# Patient Record
Sex: Male | Born: 1985 | Race: Black or African American | Hispanic: No | Marital: Single
Health system: Southern US, Community
[De-identification: ages and names within clinical notes are randomized; demographics above are authoritative.]

## PROBLEM LIST (undated history)

## (undated) HISTORY — PX: APPENDECTOMY: SHX54

---

## 2018-04-11 ENCOUNTER — Encounter (HOSPITAL_COMMUNITY): Payer: Self-pay | Admitting: Emergency Medicine

## 2018-04-11 ENCOUNTER — Other Ambulatory Visit: Payer: Self-pay

## 2018-04-11 ENCOUNTER — Emergency Department (HOSPITAL_COMMUNITY)
Admission: EM | Admit: 2018-04-11 | Discharge: 2018-04-12 | Disposition: A | Discharge status: Home / Self Care | Payer: Self-pay | Attending: Emergency Medicine | Admitting: Emergency Medicine

## 2018-04-11 DIAGNOSIS — R1013 Epigastric pain: Secondary | ICD-10-CM | POA: Insufficient documentation

## 2018-04-11 DIAGNOSIS — R112 Nausea with vomiting, unspecified: Secondary | ICD-10-CM | POA: Insufficient documentation

## 2018-04-11 DIAGNOSIS — R1010 Upper abdominal pain, unspecified: Secondary | ICD-10-CM | POA: Insufficient documentation

## 2018-04-11 LAB — URINALYSIS, ROUTINE W REFLEX MICROSCOPIC
Bacteria, UA: NONE SEEN
Bilirubin Urine: NEGATIVE
Glucose, UA: NEGATIVE mg/dL
Ketones, ur: 80 mg/dL — AB
Leukocytes,Ua: NEGATIVE
Nitrite: NEGATIVE
PH: 6 (ref 5.0–8.0)
Protein, ur: 30 mg/dL — AB
Specific Gravity, Urine: 1.031 — ABNORMAL HIGH (ref 1.005–1.030)

## 2018-04-11 LAB — COMPREHENSIVE METABOLIC PANEL
ALT: 31 U/L (ref 0–44)
AST: 34 U/L (ref 15–41)
Albumin: 4.7 g/dL (ref 3.5–5.0)
Alkaline Phosphatase: 59 U/L (ref 38–126)
Anion gap: 14 (ref 5–15)
BUN: 11 mg/dL (ref 6–20)
CALCIUM: 9.5 mg/dL (ref 8.9–10.3)
CO2: 23 mmol/L (ref 22–32)
Chloride: 95 mmol/L — ABNORMAL LOW (ref 98–111)
Creatinine, Ser: 0.92 mg/dL (ref 0.61–1.24)
GFR calc Af Amer: >60 mL/min (ref >60)
GFR calc non Af Amer: >60 mL/min (ref >60)
Glucose, Bld: 97 mg/dL (ref 70–99)
Potassium: 3.7 mmol/L (ref 3.5–5.1)
Sodium: 132 mmol/L — ABNORMAL LOW (ref 135–145)
Total Bilirubin: 1 mg/dL (ref 0.3–1.2)
Total Protein: 7.5 g/dL (ref 6.5–8.1)

## 2018-04-11 LAB — CBC
HCT: 42.9 % (ref 39.0–52.0)
Hemoglobin: 13.9 g/dL (ref 13.0–17.0)
MCH: 29.3 pg (ref 26.0–34.0)
MCHC: 32.4 g/dL (ref 30.0–36.0)
MCV: 90.3 fL (ref 80.0–100.0)
Platelets: 256 10*3/uL (ref 150–400)
RBC: 4.75 MIL/uL (ref 4.22–5.81)
RDW: 13 % (ref 11.5–15.5)
WBC: 5.4 10*3/uL (ref 4.0–10.5)
nRBC: 0 % (ref 0.0–0.2)

## 2018-04-11 LAB — LIPASE, BLOOD: Lipase: 26 U/L (ref 11–51)

## 2018-04-11 MED ORDER — SODIUM CHLORIDE 0.9% FLUSH: 3.0000 mL | Freq: Once | INTRAVENOUS | Status: DC

## 2018-04-11 MED ORDER — FAMOTIDINE 20 MG PO TABS
20.0000 mg | ORAL_TABLET | Freq: Once | ORAL | Status: AC
  Administered 2018-04-12: 20 mg via ORAL
  Filled 2018-04-11: qty 1

## 2018-04-11 MED ORDER — SODIUM CHLORIDE 0.9 % IV BOLUS: 500.0000 mL | Freq: Once | INTRAVENOUS | Status: DC

## 2018-04-11 MED ORDER — ONDANSETRON 4 MG PO TBDP
4.0000 mg | ORAL_TABLET | Freq: Once | ORAL | Status: AC
  Administered 2018-04-12: 4 mg via ORAL
  Filled 2018-04-11: qty 1

## 2018-04-11 MED ORDER — ONDANSETRON HCL 4 MG/2ML IJ SOLN: 4.0000 mg | Freq: Once | INTRAMUSCULAR | Status: DC

## 2018-04-11 MED ORDER — FAMOTIDINE IN NACL 20-0.9 MG/50ML-% IV SOLN: 20.0000 mg | Freq: Once | INTRAVENOUS | Status: DC

## 2018-04-11 NOTE — ED Triage Notes (Signed)
C/ generalized abd pain, nausea, and vomiting since Wednesday.

## 2018-04-12 ENCOUNTER — Emergency Department (HOSPITAL_COMMUNITY): Payer: Self-pay

## 2018-04-12 IMAGING — US US ABDOMEN LIMITED
1 series · 14 of 25 positions shown · non-contrast
Comparison: None.

CLINICAL DATA: Upper abdominal pain.  Nausea and vomiting.

EXAM:
ULTRASOUND ABDOMEN LIMITED RIGHT UPPER QUADRANT

[Series 1: us abdomen limited · 14 of 56 slices shown]
[im 1/56]
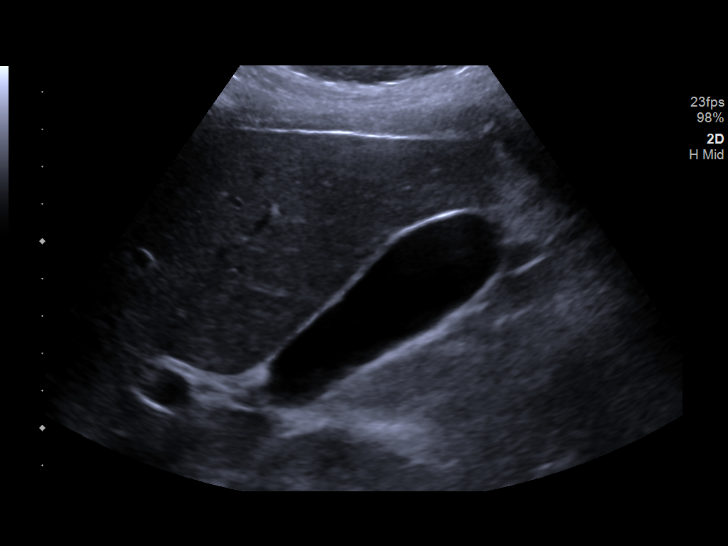
[im 5/56]
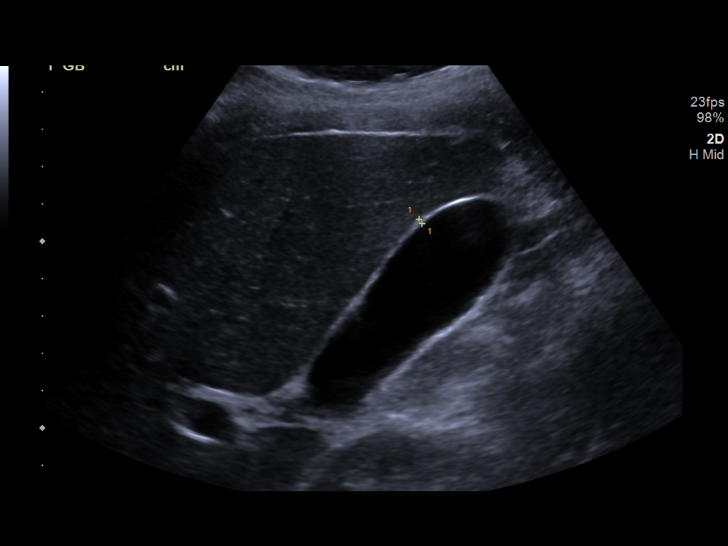
[im 10/56]
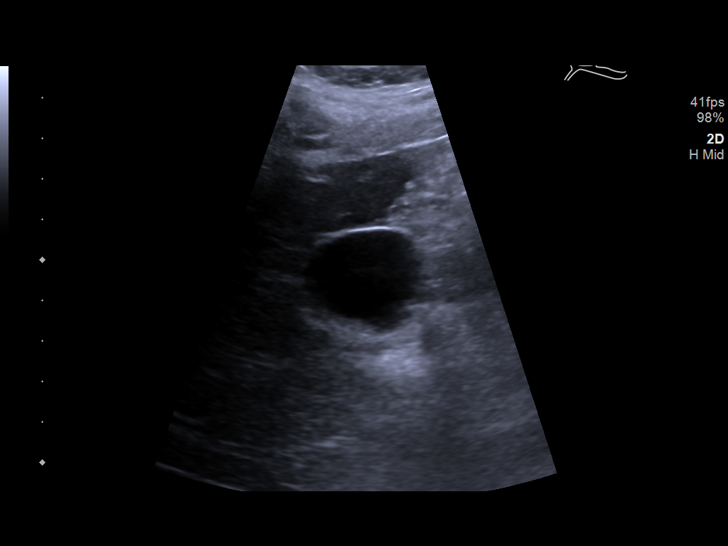
[im 14/56]
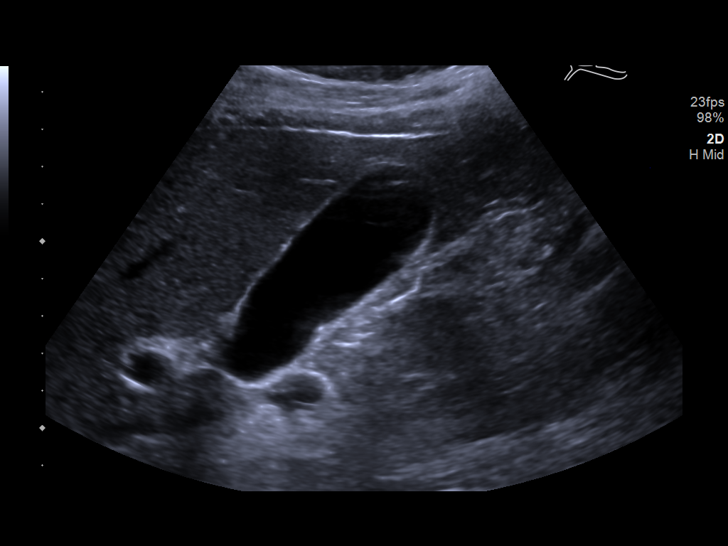
[im 19/56]
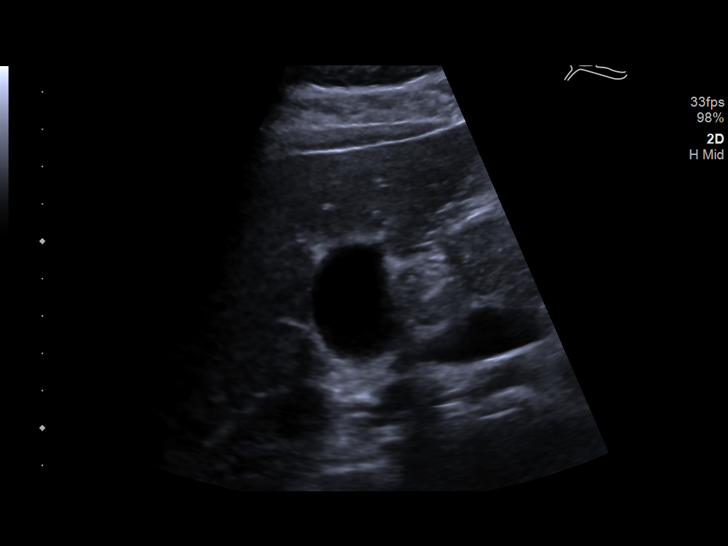
[im 21/56]
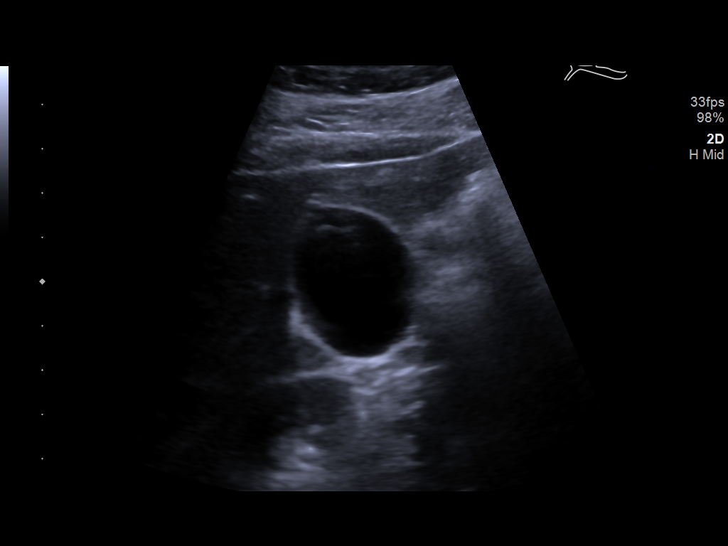
[im 26/56]
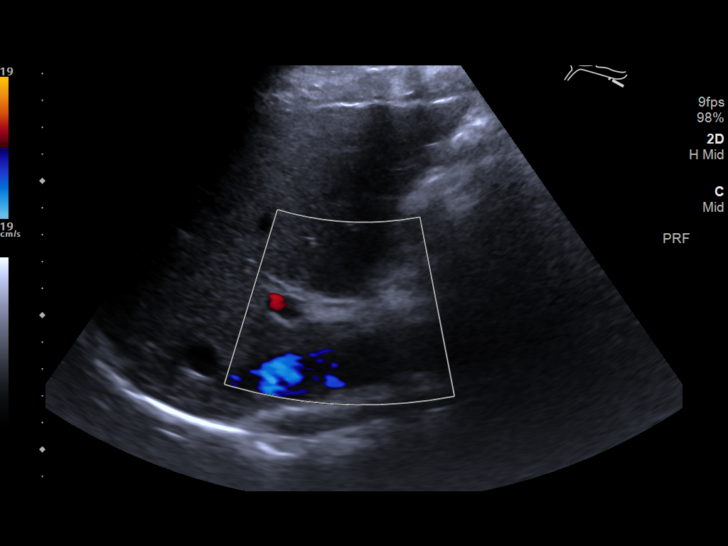
[im 30/56]
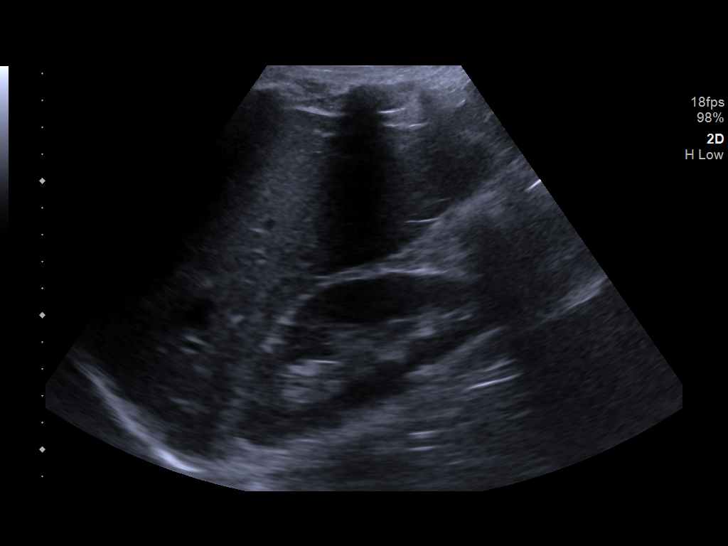
[im 35/56]
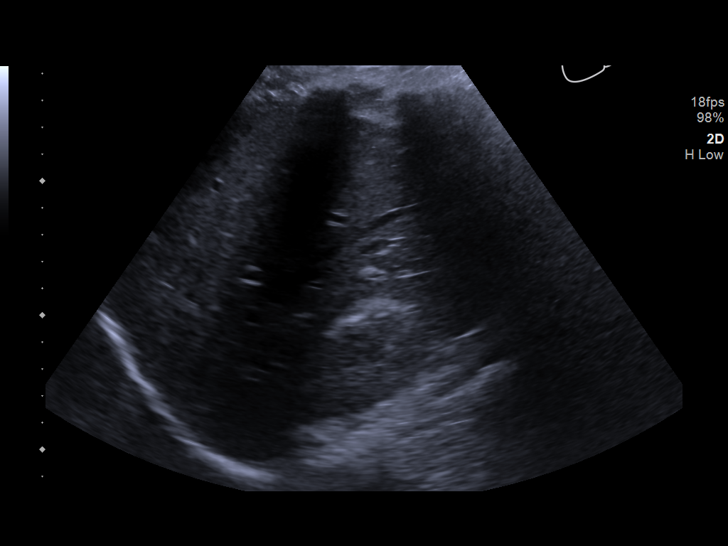
[im 37/56]
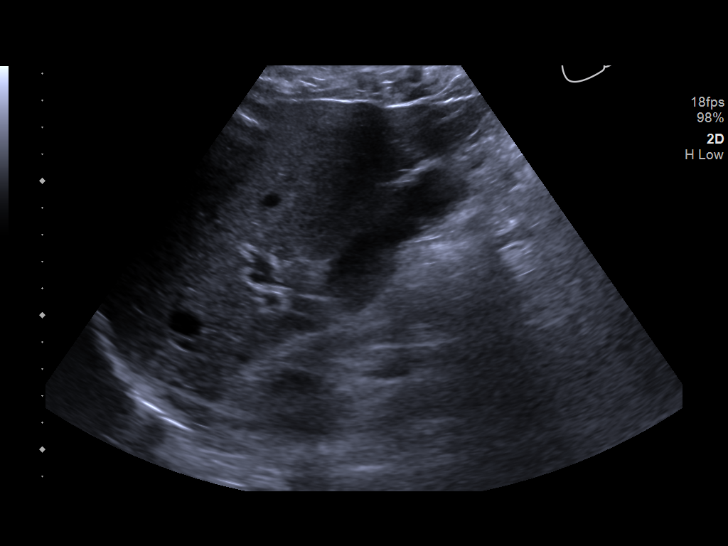
[im 42/56]
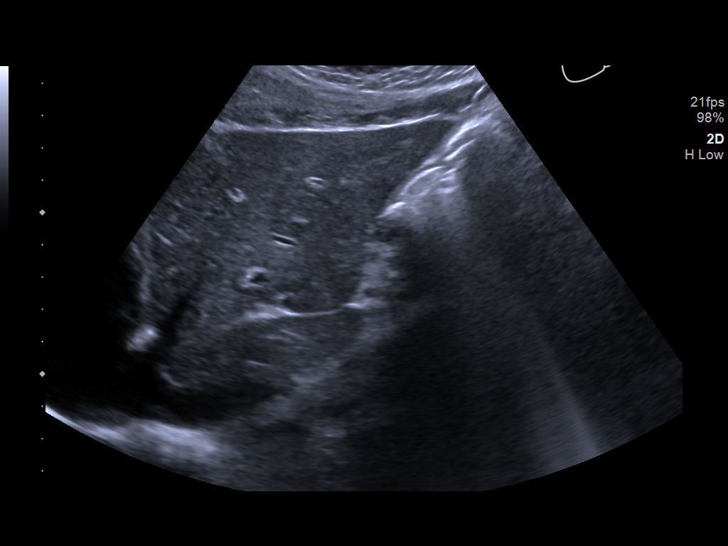
[im 46/56]
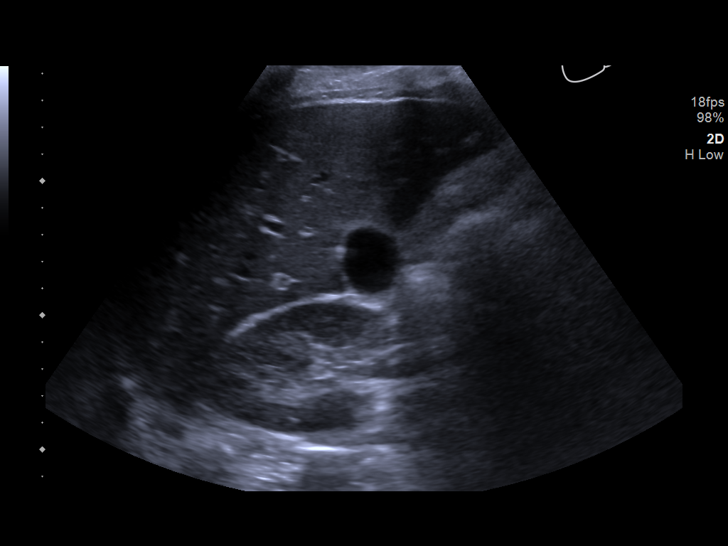
[im 51/56]
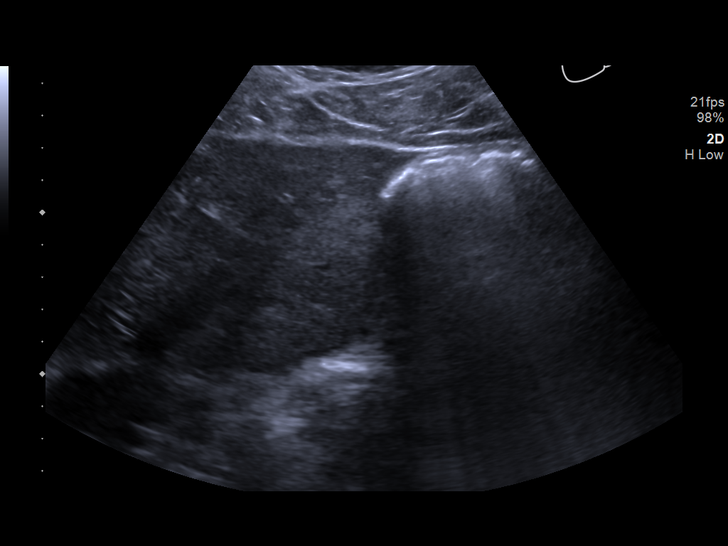
[im 56/56]
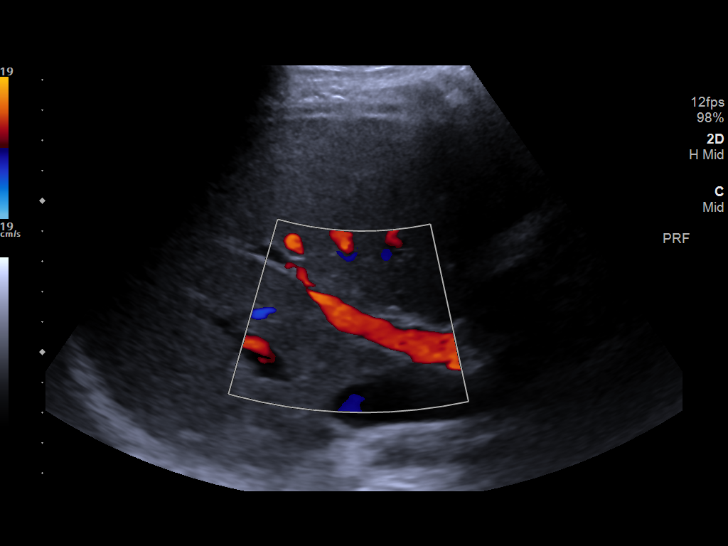

[14 of 25 positions shown; findings below may reference images not displayed]

FINDINGS: Gallbladder:

Physiologically distended. No gallstones or wall thickening
visualized. No sonographic Murphy sign noted by sonographer.

Common bile duct:

Diameter: 2 mm, normal.

Liver:

No focal lesion identified. Within normal limits in parenchymal
echogenicity. Portal vein is patent on color Doppler imaging with
normal direction of blood flow towards the liver.
IMPRESSION: Normal right upper quadrant ultrasound.

## 2018-04-12 MED ORDER — ONDANSETRON HCL 4 MG PO TABS: 4.0000 mg | ORAL_TABLET | Freq: Three times a day (TID) | ORAL | 0 refills | Status: DC | PRN

## 2018-04-12 MED ORDER — ALUM & MAG HYDROXIDE-SIMETH 400-400-40 MG/5ML PO SUSP: 5.0000 mL | Freq: Four times a day (QID) | ORAL | 0 refills | Status: DC | PRN

## 2018-04-12 MED ORDER — FAMOTIDINE 20 MG PO TABS: 20.0000 mg | ORAL_TABLET | Freq: Every day | ORAL | 0 refills | Status: DC

## 2018-04-12 NOTE — Discharge Instructions (Signed)
Take Pepcid daily to help decrease stomach acid. Use Maalox as needed for breakthrough or severe stomach pain. Use Zofran as needed for nausea or vomiting. Make sure you are staying well-hydrated water. Be careful with your food choices over the next several days.  Avoid spicy, greasy, acidic foods. Use Tylenol as needed for pain.  Try to avoid anti-inflammatories such as Advil, ibuprofen, Aleve. If your symptoms not improving in the next week, follow-up with the stomach doctor listed below. Return to the emergency room with any new, worsening, concerning symptoms.

## 2018-04-12 NOTE — ED Provider Notes (Signed)
[facility] EMERGENCY DEPARTMENT Provider Note   CSN: [id number] Arrival date & time: 04/11/18  2144    History   Chief Complaint Chief Complaint  Patient presents with  . Abdominal Pain    HPI Erik Griffith is a 33 y.o. male presenting for evaluation of abdominal pain, nausea, vomiting.  Patient states symptoms began 3 days ago.  He initially developed epigastric burning/pain which radiated up into his chest.  The following day, he had multiple sets of emesis, although this has since resolved.  Patient reports continued intermittent epigastric pain/burning.  He has not taken anything for his symptoms.  Patient states the day before his symptoms began, he was drinking a lot of alcohol, and also eat seafood, which he does not know if that was the cause of his symptoms.  He denies fevers, chills, nasal congestion, sore throat, cough, chest pain, shortness of breath, lower abdominal pain, urinary symptoms, abnormal bowel movements.  Patient has a history of appendectomy, no other abdominal history or surgeries.  Patient states he has no medical problems, takes no medications daily.  He denies a history of acid reflux or heartburn.  Patient states over the past 2 weeks, he has had an increase in Aleve intake due to MSK pain due to work.  He states he is taking approximately 3 of Aleve tablets daily for the past 2 weeks.  He also states he eats a lot of spicy food.  He is concerned he may have an ulcer.  Patient smokes half a pack of cigarettes a day, drinks alcohol occasionally, smokes marijuana daily.  No other drug use. Pt states he has been unable to tolerate p.o. in the past 3 days.     HPI  History reviewed. No pertinent past medical history.  There are no active problems to display for this patient.   Past Surgical History:  Procedure Laterality Date  . APPENDECTOMY          Home Medications    Prior to Admission medications   Medication Sig Start Date End  Date Taking? Authorizing Provider  alum & mag hydroxide-simeth (MAALOX ADVANCED MAX ST) 400-400-40 MG/5ML suspension Take 5 mLs by mouth every 6 (six) hours as needed for indigestion. 04/12/18   Randell Detter, PA-C  famotidine (PEPCID) 20 MG tablet Take 1 tablet (20 mg total) by mouth daily. 04/12/18   Agustine Rossitto, PA-C  ondansetron (ZOFRAN) 4 MG tablet Take 1 tablet (4 mg total) by mouth every 8 (eight) hours as needed for nausea or vomiting. 04/12/18   Dorris Vangorder, PA-C    Family History No family history on file.  Social History Social History   Tobacco Use  . Smoking status: Current Every Day Smoker  . Smokeless tobacco: Never Used  Substance Use Topics  . Alcohol use: Yes  . Drug use: Yes    Types: Marijuana     Allergies   Patient has no allergy information on record.   Review of Systems Review of Systems  Gastrointestinal: Positive for abdominal pain, nausea and vomiting.  All other systems reviewed and are negative.    Physical Exam Updated Vital Signs BP (!) 154/90 (BP Location: Right Arm)   Pulse 75   Temp 98.7 F (37.1 C) (Oral)   Resp 18   SpO2 97%   Physical Exam Vitals signs and nursing note reviewed.  Constitutional:      General: He is not in acute distress.    Appearance: He is well-developed.  Comments: Appears nontoxic  HENT:     Head: Normocephalic and atraumatic.     Mouth/Throat:     Mouth: Mucous membranes are moist.  Eyes:     Conjunctiva/sclera: Conjunctivae normal.     Pupils: Pupils are equal, round, and reactive to light.  Neck:     Musculoskeletal: Normal range of motion and neck supple.  Cardiovascular:     Rate and Rhythm: Normal rate and regular rhythm.     Pulses: Normal pulses.  Pulmonary:     Effort: Pulmonary effort is normal. No respiratory distress.     Breath sounds: Normal breath sounds. No wheezing.  Abdominal:     General: There is no distension.     Palpations: Abdomen is soft. There is no mass.       Tenderness: There is abdominal tenderness. There is no guarding or rebound.     Comments: Tenderness palpation of epigastric and right upper quadrant abdomen.  No rigidity, guarding, distention.  Negative rebound.  Positive Murphy's.  Musculoskeletal: Normal range of motion.  Skin:    General: Skin is warm and dry.     Capillary Refill: Capillary refill takes less than 2 seconds.  Neurological:     Mental Status: He is alert and oriented to person, place, and time.      ED Treatments / Results  Labs (all labs ordered are listed, but only abnormal results are displayed) Labs Reviewed  COMPREHENSIVE METABOLIC PANEL - Abnormal; Notable for the following components:      Result Value   Sodium 132 (*)    Chloride 95 (*)    All other components within normal limits  URINALYSIS, ROUTINE W REFLEX MICROSCOPIC - Abnormal; Notable for the following components:   Specific Gravity, Urine 1.031 (*)    Hgb urine dipstick SMALL (*)    Ketones, ur 80 (*)    Protein, ur 30 (*)    All other components within normal limits  LIPASE, BLOOD  CBC    EKG None  Radiology US Abdomen Limited Ruq  Result Date: 04/12/2018 CLINICAL DATA:  Upper abdominal pain.  Nausea and vomiting. EXAM: ULTRASOUND ABDOMEN LIMITED RIGHT UPPER QUADRANT COMPARISON:  None. FINDINGS: Gallbladder: Physiologically distended. No gallstones or wall thickening visualized. No sonographic Murphy sign noted by sonographer. Common bile duct: Diameter: 2 mm, normal. Liver: No focal lesion identified. Within normal limits in parenchymal echogenicity. Portal vein is patent on color Doppler imaging with normal direction of blood flow towards the liver. IMPRESSION: Normal right upper quadrant ultrasound. Electronically Signed   By: Narda Rutherford M.D.   On: 04/12/2018 00:59    Procedures Procedures (including critical care time)  Medications Ordered in ED Medications  famotidine (PEPCID) tablet 20 mg (20 mg Oral Given 04/12/18 0002)   ondansetron (ZOFRAN-ODT) disintegrating tablet 4 mg (4 mg Oral Given 04/12/18 0002)     Initial Impression / Assessment and Plan / ED Course  I have reviewed the triage vital signs and the nursing notes.  Pertinent labs & imaging results that were available during my care of the patient were reviewed by me and considered in my medical decision making (see chart for details).     Presenting for evaluation of nausea, vomiting, abdominal pain.  Physical exam reassuring, he appears nontoxic.  Tenderness palpation of epigastric and right upper quadrant abdomen, as such, consider gallstones.  Also consider PUD versus reflux versus gastritis.  Obtain labs, ultrasound, treat symptomatically and reassess.  Labs reassuring, no leukocytosis.  Kidney, liver,  pancreatic function reassuring.  Ultrasound negative for gallstones.  Consider reflux versus PUD.  Intra-abdominal infection, perforation, obstruction, or surgical abdomen.  I do not believe he needs CT or further imaging at this time.  Patient reports improvement of symptoms with Pepcid and Zofran.  Tolerating p.o. without difficulty.  Discussed findings with patient.  Discussed continued symptomatic treatment with daily Pepcid, as needed Maalox, and as needed Zofran.  Encouraged follow-up with GI if symptoms are not improving.  Patient counseled to decrease NSAID use and to have caution with food choices.  At this time, patient appears safe for discharge.  Return precautions given.  Patient states he understands and agrees to plan.  Final Clinical Impressions(s) / ED Diagnoses   Final diagnoses:  Upper abdominal pain  Epigastric abdominal pain  Non-intractable vomiting with nausea, unspecified vomiting type    ED Discharge Orders         Ordered    famotidine (PEPCID) 20 MG tablet  Daily     04/12/18 0113    alum & mag hydroxide-simeth (MAALOX ADVANCED MAX ST) 400-400-40 MG/5ML suspension  Every 6 hours PRN     04/12/18 0113    ondansetron  (ZOFRAN) 4 MG tablet  Every 8 hours PRN     04/12/18 0113           Alveria Apley, PA-C 04/12/18 0330    Palumbo, April, MD 04/12/18 [id number]

## 2019-01-13 ENCOUNTER — Encounter: Payer: Self-pay | Admitting: Medical

## 2019-01-13 ENCOUNTER — Other Ambulatory Visit: Payer: Self-pay

## 2019-01-13 ENCOUNTER — Ambulatory Visit (INDEPENDENT_AMBULATORY_CARE_PROVIDER_SITE_OTHER): Payer: 59 | Admitting: Medical

## 2019-01-13 VITALS — BP 130/80 | HR 99 | Temp 98.2°F | Ht 71.0 in | Wt 200.8 lb

## 2019-01-13 DIAGNOSIS — S161XXA Strain of muscle, fascia and tendon at neck level, initial encounter: Secondary | ICD-10-CM

## 2019-01-13 DIAGNOSIS — M542 Cervicalgia: Secondary | ICD-10-CM | POA: Diagnosis not present

## 2019-01-13 DIAGNOSIS — I889 Nonspecific lymphadenitis, unspecified: Secondary | ICD-10-CM | POA: Diagnosis not present

## 2019-01-13 DIAGNOSIS — M62830 Muscle spasm of back: Secondary | ICD-10-CM | POA: Diagnosis not present

## 2019-01-13 MED ORDER — NAPROXEN 500 MG PO TABS: 500.0000 mg | ORAL_TABLET | Freq: Two times a day (BID) | ORAL | 0 refills | Status: DC

## 2019-01-13 MED ORDER — METHOCARBAMOL 500 MG PO TABS: 500.0000 mg | ORAL_TABLET | Freq: Two times a day (BID) | ORAL | 0 refills | Status: DC | PRN

## 2019-01-13 NOTE — Patient Instructions (Signed)
Recommendations:  Begin Naprosyn twice daily with food for pain and inflammation for the next 5 days, then use as needed  You can use Robaxin muscle relaxer either at bedtime or up to twice daily for spasm or locked up muscle sensation  You can use heat, massage and stretching for symptoms and use stretching daily to try and prevent spasm and strain of neck, upper back and shoulder muscles  Consider massage therapy or chiropractor therapy  If you start to get more regular consistent pain, numbness or tingling in the arm regularly, then we need to consider xray of neck    Cervical Sprain  A cervical sprain is a stretch or tear in the tissues that connect bones (ligaments) in the neck. Most neck (cervical) sprains get better in 4-6 weeks. Follow these instructions at home: If you have a neck collar:  Wear it as told by your doctor. Do not take off (do not remove) the collar unless your doctor says that this is safe.  Ask your doctor before adjusting your collar.  If you have long hair, keep it outside of the collar.  Ask your doctor if you may take off the collar for cleaning and bathing. If you may take off the collar: ? Follow instructions from your doctor about how to take off the collar safely. ? Clean the collar by wiping it with mild soap and water. Let it air-dry all the way. ? If your collar has removable pads:  Take the pads out every 1-2 days.  Hand wash the pads with soap and water.  Let the pads air-dry all the way before you put them back in the collar. Do not dry them in a clothes dryer. Do not dry them with a hair dryer. ? Check your skin under the collar for irritation or sores. If you see any, tell your doctor. Managing pain, stiffness, and swelling   Use a cervical traction device, if told by your doctor.  If told, put heat on the affected area. Do this before exercises (physical therapy) or as often as told by your doctor. Use the heat source that your  doctor recommends, such as a moist heat pack or a heating pad. ? Place a towel between your skin and the heat source. ? Leave the heat on for 20-30 minutes. ? Take the heat off (remove the heat) if your skin turns bright red. This is very important if you cannot feel pain, heat, or cold. You may have a greater risk of getting burned.  Put ice on the affected area. ? Put ice in a plastic bag. ? Place a towel between your skin and the bag. ? Leave the ice on for 20 minutes, 2-3 times a day. Activity  Do not drive while wearing a neck collar. If you do not have a neck collar, ask your doctor if it is safe to drive.  Do not drive or use heavy machinery while taking prescription pain medicine or muscle relaxants, unless your doctor approves.  Do not lift anything that is heavier than 10 lb (4.5 kg) until your doctor tells you that it is safe.  Rest as told by your doctor.  Avoid activities that make you feel worse. Ask your doctor what activities are safe for you.  Do exercises as told by your doctor or physical therapist. Preventing neck sprain  Practice good posture. Adjust your workstation to help with this, if needed.  Exercise regularly as told by your doctor or physical therapist.  Avoid activities that are risky or may cause a neck sprain (cervical sprain). General instructions  Take over-the-counter and prescription medicines only as told by your doctor.  Do not use any products that contain nicotine or tobacco. This includes cigarettes and e-cigarettes. If you need help quitting, ask your doctor.  Keep all follow-up visits as told by your doctor. This is important. Contact a doctor if:  You have pain or other symptoms that get worse.  You have symptoms that do not get better after 2 weeks.  You have pain that does not get better with medicine.  You start to have new, unexplained symptoms.  You have sores or irritated skin from wearing your neck collar. Get help  right away if:  You have very bad pain.  You have any of the following in any part of your body: ? Loss of feeling (numbness). ? Tingling. ? Weakness.  You cannot move a part of your body (you have paralysis).  Your activity level does not improve. Summary  A cervical sprain is a stretch or tear in the tissues that connect bones (ligaments) in the neck.  If you have a neck (cervical) collar, do not take off the collar unless your doctor says that this is safe.  Put ice on affected areas as told by your doctor.  Put heat on affected areas as told by your doctor.  Good posture and regular exercise can help prevent a neck sprain from happening again. This information is not intended to replace advice given to you by your health care provider. Make sure you discuss any questions you have with your health care provider. Document Released: 07/16/2007 Document Revised: 05/19/2018 Document Reviewed: 10/09/2015 Elsevier Patient Education  2020 [employer]     Acute Torticollis, Adult Torticollis is a condition in which the muscles of the neck tighten (contract) abnormally, causing the neck to twist and the head to move into an unnatural position. Torticollis that develops suddenly is called acute torticollis. People with acute torticollis may have trouble turning their head. The condition can be painful and may range from mild to severe. What are the causes? This condition may be caused by:  Sleeping in an awkward position (common).  Extending or twisting the neck muscles beyond their normal position.  An injury to the neck muscles.  An infection.  A tumor.  Certain medicines.  Long-lasting spasms of the neck muscles. In some cases, the cause may not be known. What increases the risk? You are more likely to develop this condition if:  You have a condition associated with loose ligaments, such as Down syndrome.  You have a brain condition that affects vision, such as  strabismus. What are the signs or symptoms? The main symptom of this condition is tilting of the head to one side. Other symptoms include:  Pain in the neck.  Trouble turning the head from side to side or up and down. How is this diagnosed? This condition may be diagnosed based on:  A physical exam.  Your medical history.  Imaging tests, such as: ? An X-ray. ? An ultrasound. ? A CT scan. ? An MRI. How is this treated? Treatment for this condition depends on what is causing the condition. Mild cases may go away without treatment. Treatment for more serious cases may include:  Medicines or shots to relax the muscles.  Other medicines, such as antibiotics to treat the underlying cause.  Wearing a soft neck collar.  Physical therapy and stretching to  improve neck strength and flexibility.  Neck massage. In severe cases, surgery may be needed to repair dislocated or broken bones or to treat nerves in the neck. Follow these instructions at home:   Take over-the-counter and prescription medicines only as told by your health care provider.  Do stretching exercises and massage your neck as told by your health care provider.  If directed, apply heat to the affected area as often as told by your health care provider. Use the heat source that your health care provider recommends, such as a moist heat pack or a heating pad. ? Place a towel between your skin and the heat source. ? Leave the heat on for 20-30 minutes. ? Remove the heat if your skin turns bright red. This is especially important if you are unable to feel pain, heat, or cold. You may have a greater risk of getting burned.  If you wake up with torticollis after sleeping, check your bed or sleeping area. Look for lumpy pillows or unusual objects. Make sure your bed and sleeping area are comfortable.  Keep all follow-up visits as told by your health care provider. This is important. Contact a health care provider if:   You have a fever.  Your symptoms do not improve or they get worse. Get help right away if:  You have trouble breathing.  You develop noisy breathing (stridor).  You start to drool.  You have trouble swallowing or pain when swallowing.  You develop numbness or weakness in your hands or feet.  You have changes in your speech, understanding, or vision.  You are in severe pain.  You cannot move your head or neck. Summary  Torticollis is a condition in which the muscles of the neck tighten (contract) abnormally, causing the neck to twist and the head to move into an unnatural position. Torticollis that develops suddenly is called acute torticollis.  Treatment for this condition depends on what is causing the condition. Mild cases may go away without treatment.  Do stretching exercises and massage your neck as told by your health care provider. You may also be instructed to apply heat to the area.  Contact your health care provider if your symptoms do not improve or they get worse. This information is not intended to replace advice given to you by your health care provider. Make sure you discuss any questions you have with your health care provider. Document Released: 01/25/2000 Document Revised: 01/09/2017 Document Reviewed: 03/27/2016 Elsevier Patient Education  2020 [employer].

## 2019-01-13 NOTE — Progress Notes (Signed)
Subjective:  Hale Chalfin is a 33 y.o. male who presents for Chief Complaint  Patient presents with  . New Patient (Initial Visit)     Here as a new patient today.  Mother referred him to [place].  Has been to [facility] some, but no routine PCP prior.  Just moved to [place] recently.  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  For the past year he has been working 6 days per week, 12 hour days.   In last few weeks getting pains in neck, upper left back, upper left arm, intermittent. Has had 2 specific days recently where he felt his muscles locked up, couldn't turn head very much.  At times he gets numb sensation in left arm.  1.5 week ago, neck felt stuck to the left, arm went numb again for 5 minutes.  The next morning had 4-5 knots that flared up in his neck.   For 3 days used heating pad.   No injury, no fall, no fever.  Been working this job for past year.  Has tried some Aleve, Ibuprofen, got to the point he was using these daily.   Stretches sometimes, but it is seldom. No recent fever, no fall, no injury.  No other aggravating or relieving factors.    No other c/o.  The following portions of the patient's history were reviewed and updated as appropriate: allergies, current medications, past family history, past medical history, past social history, past surgical history and problem list.  ROS Otherwise as in subjective above   Objective: BP 130/80   Pulse 99   Temp 98.2 F (36.8 C)   Ht 5\' 11"  (1.803 m)   Wt 200 lb 12.8 oz (91.1 kg)   SpO2 97%   BMI 28.01 kg/m   General appearance: alert, no distress, well developed, well nourished, african [place] male Skin unremarkable Tender over bilat posterior neck, no midline tenderness, mild pain with next extension which is full, otherwise neck normal ROM, no thyromegaly, no mass, but there are some small palpable lymph nodes in bilat posterior triangle, benign appearing Tender upper left back  paraspinal and supraspinatus, otherwise back non tender MSK: left arm nontender, no laxity, normal ROM, no swelling, no deformity Arms neurovascularly intact -tinels and phalens    Assessment: Encounter Diagnoses  Name Primary?  . Neck pain Yes  . Neck strain, initial encounter   . Back spasm   . Lymphadenitis      Plan: Neck pain, strain and spasm - advised the next 3-5 days of relative rest, stretching, heat, can use naproxen BID x 5 days, then prn, and can use Robaxin QHS the next few days.   Consider massage therapy.   symptos related to activity and the fact he is working 6 days per week 12 hour days.  lymphadenitis - resolved, reassured.   Benigno was seen today for new patient (initial visit).  Diagnoses and all orders for this visit:  Neck pain  Neck strain, initial encounter  Back spasm  Lymphadenitis  Other orders -     methocarbamol (ROBAXIN) 500 MG tablet; Take 1 tablet (500 mg total) by mouth 2 (two) times daily as needed for muscle spasms. -     naproxen (NAPROSYN) 500 MG tablet; Take 1 tablet (500 mg total) by mouth 2 (two) times daily with a meal.   Follow up: for physical in a few months.

## 2019-02-27 ENCOUNTER — Other Ambulatory Visit: Payer: Self-pay

## 2019-02-27 ENCOUNTER — Emergency Department (HOSPITAL_COMMUNITY)
Admission: EM | Admit: 2019-02-27 | Discharge: 2019-02-27 | Disposition: A | Discharge status: Home / Self Care | Payer: Self-pay | Attending: Emergency Medicine | Admitting: Emergency Medicine

## 2019-02-27 ENCOUNTER — Emergency Department (HOSPITAL_COMMUNITY): Payer: Self-pay

## 2019-02-27 DIAGNOSIS — Y999 Unspecified external cause status: Secondary | ICD-10-CM | POA: Insufficient documentation

## 2019-02-27 DIAGNOSIS — Z20822 Contact with and (suspected) exposure to covid-19: Secondary | ICD-10-CM | POA: Insufficient documentation

## 2019-02-27 DIAGNOSIS — S61412A Laceration without foreign body of left hand, initial encounter: Secondary | ICD-10-CM | POA: Insufficient documentation

## 2019-02-27 DIAGNOSIS — Y92009 Unspecified place in unspecified non-institutional (private) residence as the place of occurrence of the external cause: Secondary | ICD-10-CM | POA: Insufficient documentation

## 2019-02-27 DIAGNOSIS — S61419A Laceration without foreign body of unspecified hand, initial encounter: Secondary | ICD-10-CM

## 2019-02-27 DIAGNOSIS — S0181XA Laceration without foreign body of other part of head, initial encounter: Secondary | ICD-10-CM

## 2019-02-27 DIAGNOSIS — Y939 Activity, unspecified: Secondary | ICD-10-CM | POA: Insufficient documentation

## 2019-02-27 LAB — CBC WITH DIFFERENTIAL/PLATELET
Abs Immature Granulocytes: 0.01 10*3/uL (ref 0.00–0.07)
Basophils Absolute: 0 10*3/uL (ref 0.0–0.1)
Basophils Relative: 0 %
Eosinophils Absolute: 0.1 10*3/uL (ref 0.0–0.5)
Eosinophils Relative: 1 %
HCT: 33.5 % — ABNORMAL LOW (ref 39.0–52.0)
Hemoglobin: 11.2 g/dL — ABNORMAL LOW (ref 13.0–17.0)
Immature Granulocytes: 0 %
Lymphocytes Relative: 49 %
Lymphs Abs: 3.1 10*3/uL (ref 0.7–4.0)
MCH: 30.9 pg (ref 26.0–34.0)
MCHC: 33.4 g/dL (ref 30.0–36.0)
MCV: 92.5 fL (ref 80.0–100.0)
Monocytes Absolute: 0.4 10*3/uL (ref 0.1–1.0)
Monocytes Relative: 7 %
Neutro Abs: 2.6 10*3/uL (ref 1.7–7.7)
Neutrophils Relative %: 43 %
Platelets: 209 10*3/uL (ref 150–400)
RBC: 3.62 MIL/uL — ABNORMAL LOW (ref 4.22–5.81)
RDW: 12.8 % (ref 11.5–15.5)
WBC: 6.2 10*3/uL (ref 4.0–10.5)
nRBC: 0 % (ref 0.0–0.2)

## 2019-02-27 LAB — TYPE AND SCREEN
ABO/RH(D): O NEG
Antibody Screen: NEGATIVE
Unit division: 0
Unit division: 0

## 2019-02-27 LAB — COMPREHENSIVE METABOLIC PANEL
ALT: 34 U/L (ref 0–44)
AST: 32 U/L (ref 15–41)
Albumin: 3.7 g/dL (ref 3.5–5.0)
Alkaline Phosphatase: 45 U/L (ref 38–126)
Anion gap: 9 (ref 5–15)
BUN: 16 mg/dL (ref 6–20)
CO2: 24 mmol/L (ref 22–32)
Calcium: 8.2 mg/dL — ABNORMAL LOW (ref 8.9–10.3)
Chloride: 111 mmol/L (ref 98–111)
Creatinine, Ser: 1.11 mg/dL (ref 0.61–1.24)
GFR calc Af Amer: >60 mL/min (ref >60)
GFR calc non Af Amer: >60 mL/min (ref >60)
Glucose, Bld: 142 mg/dL — ABNORMAL HIGH (ref 70–99)
Potassium: 3.6 mmol/L (ref 3.5–5.1)
Sodium: 144 mmol/L (ref 135–145)
Total Bilirubin: 0.3 mg/dL (ref 0.3–1.2)
Total Protein: 5.8 g/dL — ABNORMAL LOW (ref 6.5–8.1)

## 2019-02-27 LAB — PROTIME-INR
INR: 1.1 (ref 0.8–1.2)
Prothrombin Time: 14.1 seconds (ref 11.4–15.2)

## 2019-02-27 LAB — BPAM RBC
Blood Product Expiration Date: 202102132359
Blood Product Expiration Date: 202102142359
ISSUE DATE / TIME: 202101170324
ISSUE DATE / TIME: 202101170324
Unit Type and Rh: 5100
Unit Type and Rh: 5100

## 2019-02-27 LAB — BPAM FFP
Blood Product Expiration Date: 202101312359
Blood Product Expiration Date: 202102012359
ISSUE DATE / TIME: 202101170324
ISSUE DATE / TIME: 202101170324
Unit Type and Rh: 600
Unit Type and Rh: 600

## 2019-02-27 LAB — PREPARE FRESH FROZEN PLASMA
Unit division: 0
Unit division: 0

## 2019-02-27 LAB — ABO/RH: ABO/RH(D): O NEG

## 2019-02-27 LAB — RESPIRATORY PANEL BY RT PCR (FLU A&B, COVID)
Influenza A by PCR: NEGATIVE
Influenza B by PCR: NEGATIVE
SARS Coronavirus 2 by RT PCR: NEGATIVE
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
## 2019-02-27 MED ORDER — FENTANYL CITRATE (PF) 100 MCG/2ML IJ SOLN
100.0000 ug | Freq: Once | INTRAMUSCULAR | Status: AC
  Administered 2019-02-27: 100 ug via INTRAVENOUS

## 2019-02-27 MED ORDER — LIDOCAINE HCL 2 % IJ SOLN
15.0000 mL | Freq: Once | INTRAMUSCULAR | Status: AC
  Administered 2019-02-27: 300 mg via INTRADERMAL
  Filled 2019-02-27: qty 20

## 2019-02-27 MED ORDER — SODIUM CHLORIDE 0.9 % IV SOLN: 1000.0000 mL | INTRAVENOUS | Status: DC

## 2019-02-27 MED ORDER — LIDOCAINE HCL (PF) 1 % IJ SOLN
2.0000 mL | Freq: Once | INTRAMUSCULAR | Status: AC
  Administered 2019-02-27: 2 mL
  Filled 2019-02-27: qty 5

## 2019-02-27 MED ORDER — HYDROCODONE-ACETAMINOPHEN 5-325 MG PO TABS: 1.0000 | ORAL_TABLET | Freq: Three times a day (TID) | ORAL | 0 refills | Status: AC | PRN

## 2019-02-27 MED ORDER — HYDROCODONE-ACETAMINOPHEN 5-325 MG PO TABS: 1.0000 | ORAL_TABLET | Freq: Three times a day (TID) | ORAL | 0 refills | Status: DC | PRN

## 2019-02-27 MED ORDER — SODIUM CHLORIDE 0.9 % IV BOLUS
1000.0000 mL | Freq: Once | INTRAVENOUS | Status: AC
  Administered 2019-02-27: 1000 mL via INTRAVENOUS

## 2019-02-27 MED ORDER — SODIUM CHLORIDE 0.9 % IV BOLUS (SEPSIS)
1000.0000 mL | Freq: Once | INTRAVENOUS | Status: AC
  Administered 2019-02-27: 1000 mL via INTRAVENOUS

## 2019-02-27 MED ORDER — CEPHALEXIN 500 MG PO CAPS: 500.0000 mg | ORAL_CAPSULE | Freq: Three times a day (TID) | ORAL | 0 refills | Status: AC

## 2019-02-27 MED ORDER — FENTANYL CITRATE (PF) 100 MCG/2ML IJ SOLN
INTRAMUSCULAR | Status: AC
  Filled 2019-02-27: qty 2

## 2019-02-27 MED ORDER — ACETAMINOPHEN 500 MG PO TABS: 1000.0000 mg | ORAL_TABLET | Freq: Three times a day (TID) | ORAL | 0 refills | Status: DC

## 2019-02-27 MED ORDER — CEFAZOLIN SODIUM-DEXTROSE 1-4 GM/50ML-% IV SOLN
1.0000 g | Freq: Once | INTRAVENOUS | Status: AC
  Administered 2019-02-27: 1 g via INTRAVENOUS
  Filled 2019-02-27: qty 50

## 2019-02-27 MED ORDER — TETANUS-DIPHTH-ACELL PERTUSSIS 5-2.5-18.5 LF-MCG/0.5 IM SUSP: 0.5000 mL | Freq: Once | INTRAMUSCULAR | Status: DC

## 2019-02-27 NOTE — ED Notes (Signed)
Please call pt mother with update on pt. Erik Griffith [phone]
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
## 2019-02-28 ENCOUNTER — Other Ambulatory Visit: Payer: Self-pay | Admitting: Orthopedic Surgery

## 2019-02-28 ENCOUNTER — Other Ambulatory Visit: Payer: Self-pay

## 2019-02-28 ENCOUNTER — Encounter (HOSPITAL_BASED_OUTPATIENT_CLINIC_OR_DEPARTMENT_OTHER): Payer: Self-pay | Admitting: Orthopedic Surgery

## 2019-03-01 ENCOUNTER — Other Ambulatory Visit: Payer: Self-pay | Admitting: Orthopedic Surgery

## 2019-03-03 ENCOUNTER — Other Ambulatory Visit: Payer: Self-pay | Admitting: Orthopedic Surgery

## 2019-03-03 ENCOUNTER — Other Ambulatory Visit (HOSPITAL_COMMUNITY)
Admission: RE | Admit: 2019-03-03 | Discharge: 2019-03-03 | Disposition: A | Discharge status: Home / Self Care | Payer: HRSA Program | Source: Ambulatory Visit | Attending: Orthopedic Surgery | Admitting: Orthopedic Surgery

## 2019-03-03 DIAGNOSIS — Z20822 Contact with and (suspected) exposure to covid-19: Secondary | ICD-10-CM | POA: Insufficient documentation

## 2019-03-03 DIAGNOSIS — Z01812 Encounter for preprocedural laboratory examination: Secondary | ICD-10-CM | POA: Diagnosis present

## 2019-03-03 LAB — SARS CORONAVIRUS 2 (TAT 6-24 HRS): SARS Coronavirus 2: NEGATIVE

## 2019-03-04 NOTE — Progress Notes (Signed)

## 2019-03-06 NOTE — Anesthesia Preprocedure Evaluation (Addendum)
Anesthesia Evaluation    Reviewed: Allergy & Precautions, Patient's Chart, lab work & pertinent test results  Airway Mallampati: II  TM Distance: >3 FB Neck ROM: Full    Dental no notable dental hx. (+) Teeth Intact   Pulmonary Current Smoker and Patient abstained from smoking.,    Pulmonary exam normal breath sounds clear to auscultation       Cardiovascular negative cardio ROS Normal cardiovascular exam Rhythm:Regular Rate:Normal     Neuro/Psych negative neurological ROS     GI/Hepatic negative GI ROS, Neg liver ROS,   Endo/Other  negative endocrine ROS  Renal/GU negative Renal ROS     Musculoskeletal negative musculoskeletal ROS (+)   Abdominal   Peds  Hematology negative hematology ROS (+)   Anesthesia Other Findings   Reproductive/Obstetrics                           Anesthesia Physical Anesthesia Plan  ASA: II  Anesthesia Plan: General   Post-op Pain Management:    Induction: Intravenous  PONV Risk Score and Plan: Treatment may vary due to age or medical condition, Ondansetron and Dexamethasone  Airway Management Planned: LMA  Additional Equipment: None  Intra-op Plan:   Post-operative Plan:   Informed Consent:     Dental advisory given  Plan Discussed with:   Anesthesia Plan Comments: (LMA GA)        Anesthesia Quick Evaluation

## 2019-03-07 ENCOUNTER — Ambulatory Visit (HOSPITAL_BASED_OUTPATIENT_CLINIC_OR_DEPARTMENT_OTHER): Payer: Self-pay | Admitting: Anesthesiology

## 2019-03-07 ENCOUNTER — Other Ambulatory Visit: Payer: Self-pay

## 2019-03-07 ENCOUNTER — Encounter (HOSPITAL_BASED_OUTPATIENT_CLINIC_OR_DEPARTMENT_OTHER): Payer: Self-pay | Admitting: Orthopedic Surgery

## 2019-03-07 ENCOUNTER — Ambulatory Visit (HOSPITAL_BASED_OUTPATIENT_CLINIC_OR_DEPARTMENT_OTHER)
Admission: RE | Admit: 2019-03-07 | Discharge: 2019-03-07 | Disposition: A | Discharge status: Home / Self Care | Payer: Self-pay | Attending: Orthopedic Surgery | Admitting: Orthopedic Surgery

## 2019-03-07 ENCOUNTER — Encounter (HOSPITAL_BASED_OUTPATIENT_CLINIC_OR_DEPARTMENT_OTHER)
Admission: RE | Disposition: A | Discharge status: Home / Self Care | Payer: Self-pay | Source: Home / Self Care | Attending: Orthopedic Surgery

## 2019-03-07 DIAGNOSIS — S61412A Laceration without foreign body of left hand, initial encounter: Secondary | ICD-10-CM | POA: Insufficient documentation

## 2019-03-07 DIAGNOSIS — F17200 Nicotine dependence, unspecified, uncomplicated: Secondary | ICD-10-CM | POA: Insufficient documentation

## 2019-03-07 HISTORY — PX: NERVE REPAIR: SHX2083

## 2019-03-07 HISTORY — PX: FLEXOR TENDON REPAIR: SHX6501

## 2019-03-07 SURGERY — REPAIR, TENDON, FLEXOR
Anesthesia: General | Site: Thumb | Laterality: Left

## 2019-03-07 MED ORDER — ONDANSETRON HCL 4 MG/2ML IJ SOLN
INTRAMUSCULAR | Status: DC | PRN
  Administered 2019-03-07: 4 mg via INTRAVENOUS

## 2019-03-07 MED ORDER — LIDOCAINE 2% (20 MG/ML) 5 ML SYRINGE
INTRAMUSCULAR | Status: DC | PRN
  Administered 2019-03-07: 100 mg via INTRAVENOUS

## 2019-03-07 MED ORDER — FENTANYL CITRATE (PF) 100 MCG/2ML IJ SOLN
INTRAMUSCULAR | Status: DC | PRN
  Administered 2019-03-07: 50 ug via INTRAVENOUS
  Administered 2019-03-07: 75 ug via INTRAVENOUS
  Administered 2019-03-07: 50 ug via INTRAVENOUS
  Administered 2019-03-07: 25 ug via INTRAVENOUS

## 2019-03-07 MED ORDER — IBUPROFEN 600 MG PO TABS: 600.0000 mg | ORAL_TABLET | Freq: Four times a day (QID) | ORAL | Status: DC

## 2019-03-07 MED ORDER — LACTATED RINGERS IV SOLN: INTRAVENOUS | Status: DC

## 2019-03-07 MED ORDER — ACETAMINOPHEN 325 MG PO TABS: 650.0000 mg | ORAL_TABLET | Freq: Four times a day (QID) | ORAL | Status: DC

## 2019-03-07 MED ORDER — BUPIVACAINE HCL (PF) 0.5 % IJ SOLN
INTRAMUSCULAR | Status: DC | PRN
  Administered 2019-03-07: 19 mL

## 2019-03-07 MED ORDER — HYDROCODONE-ACETAMINOPHEN 5-325 MG PO TABS
ORAL_TABLET | ORAL | Status: AC
  Filled 2019-03-07: qty 1

## 2019-03-07 MED ORDER — CELECOXIB 200 MG PO CAPS
ORAL_CAPSULE | ORAL | Status: AC
  Filled 2019-03-07: qty 2

## 2019-03-07 MED ORDER — LACTATED RINGERS IV SOLN: INTRAVENOUS | Status: DC | PRN

## 2019-03-07 MED ORDER — 0.9 % SODIUM CHLORIDE (POUR BTL) OPTIME
TOPICAL | Status: DC | PRN
  Administered 2019-03-07: 100 mL

## 2019-03-07 MED ORDER — FENTANYL CITRATE (PF) 100 MCG/2ML IJ SOLN
INTRAMUSCULAR | Status: AC
  Filled 2019-03-07: qty 2

## 2019-03-07 MED ORDER — FENTANYL CITRATE (PF) 100 MCG/2ML IJ SOLN: 50.0000 ug | INTRAMUSCULAR | Status: DC | PRN

## 2019-03-07 MED ORDER — MIDAZOLAM HCL 2 MG/2ML IJ SOLN
1.0000 mg | INTRAMUSCULAR | Status: DC | PRN
  Administered 2019-03-07: 2 mg via INTRAVENOUS

## 2019-03-07 MED ORDER — OXYCODONE HCL 5 MG PO TABS: 5.0000 mg | ORAL_TABLET | Freq: Four times a day (QID) | ORAL | 0 refills | Status: DC | PRN

## 2019-03-07 MED ORDER — ACETAMINOPHEN 500 MG PO TABS
ORAL_TABLET | ORAL | Status: AC
  Filled 2019-03-07: qty 2

## 2019-03-07 MED ORDER — GABAPENTIN 300 MG PO CAPS
300.0000 mg | ORAL_CAPSULE | Freq: Once | ORAL | Status: AC
  Administered 2019-03-07: 300 mg via ORAL

## 2019-03-07 MED ORDER — MIDAZOLAM HCL 2 MG/2ML IJ SOLN
INTRAMUSCULAR | Status: AC
  Filled 2019-03-07: qty 2

## 2019-03-07 MED ORDER — MEPERIDINE HCL 25 MG/ML IJ SOLN: 6.2500 mg | INTRAMUSCULAR | Status: DC | PRN

## 2019-03-07 MED ORDER — CEFAZOLIN SODIUM-DEXTROSE 2-4 GM/100ML-% IV SOLN
2.0000 g | INTRAVENOUS | Status: AC
  Administered 2019-03-07: 2 g via INTRAVENOUS

## 2019-03-07 MED ORDER — GABAPENTIN 300 MG PO CAPS
ORAL_CAPSULE | ORAL | Status: AC
  Filled 2019-03-07: qty 1

## 2019-03-07 MED ORDER — PROPOFOL 10 MG/ML IV BOLUS
INTRAVENOUS | Status: DC | PRN
  Administered 2019-03-07: 200 mg via INTRAVENOUS

## 2019-03-07 MED ORDER — ONDANSETRON HCL 4 MG/2ML IJ SOLN: 4.0000 mg | Freq: Once | INTRAMUSCULAR | Status: DC | PRN

## 2019-03-07 MED ORDER — ACETAMINOPHEN 500 MG PO TABS
1000.0000 mg | ORAL_TABLET | Freq: Once | ORAL | Status: AC
  Administered 2019-03-07: 08:00:00 1000 mg via ORAL

## 2019-03-07 MED ORDER — HYDROCODONE-ACETAMINOPHEN 7.5-325 MG PO TABS
1.0000 | ORAL_TABLET | Freq: Once | ORAL | Status: AC | PRN
  Administered 2019-03-07: 12:00:00 1 via ORAL

## 2019-03-07 MED ORDER — FENTANYL CITRATE (PF) 100 MCG/2ML IJ SOLN
25.0000 ug | INTRAMUSCULAR | Status: DC | PRN
  Administered 2019-03-07: 12:00:00 50 ug via INTRAVENOUS

## 2019-03-07 MED ORDER — CEFAZOLIN SODIUM-DEXTROSE 2-4 GM/100ML-% IV SOLN
INTRAVENOUS | Status: AC
  Filled 2019-03-07: qty 100

## 2019-03-07 MED ORDER — CELECOXIB 400 MG PO CAPS
400.0000 mg | ORAL_CAPSULE | Freq: Once | ORAL | Status: AC
  Administered 2019-03-07: 08:00:00 400 mg via ORAL

## 2019-03-07 SURGICAL SUPPLY — 71 items
BAND RUBBER #18 3X1/16 STRL (MISCELLANEOUS) IMPLANT
BLADE MINI RND TIP GREEN BEAV (BLADE) IMPLANT
BLADE SURG 15 STRL LF DISP TIS (BLADE) ×2 IMPLANT
BLADE SURG 15 STRL SS (BLADE) ×4
BNDG COHESIVE 4X5 TAN STRL (GAUZE/BANDAGES/DRESSINGS) ×3 IMPLANT
BNDG ESMARK 4X9 LF (GAUZE/BANDAGES/DRESSINGS) ×3 IMPLANT
BNDG GAUZE ELAST 4 BULKY (GAUZE/BANDAGES/DRESSINGS) ×3 IMPLANT
CHLORAPREP W/TINT 26 (MISCELLANEOUS) IMPLANT
CORD BIPOLAR FORCEPS 12FT (ELECTRODE) ×3 IMPLANT
COVER BACK TABLE 60X90IN (DRAPES) ×3 IMPLANT
COVER MAYO STAND STRL (DRAPES) ×3 IMPLANT
COVER WAND RF STERILE (DRAPES) IMPLANT
CUFF TOURN SGL QUICK 18X4 (TOURNIQUET CUFF) ×3 IMPLANT
DECANTER SPIKE VIAL GLASS SM (MISCELLANEOUS) IMPLANT
DRAIN PENROSE 1/4X12 LTX STRL (WOUND CARE) IMPLANT
DRAPE EXTREMITY T 121X128X90 (DISPOSABLE) ×3 IMPLANT
DRAPE SURG 17X23 STRL (DRAPES) ×6 IMPLANT
DRSG EMULSION OIL 3X3 NADH (GAUZE/BANDAGES/DRESSINGS) ×3 IMPLANT
ELECT REM PT RETURN 9FT ADLT (ELECTROSURGICAL)
ELECTRODE REM PT RTRN 9FT ADLT (ELECTROSURGICAL) IMPLANT
GAUZE SPONGE 4X4 12PLY STRL LF (GAUZE/BANDAGES/DRESSINGS) ×3 IMPLANT
GLOVE BIO SURGEON STRL SZ7.5 (GLOVE) ×3 IMPLANT
GLOVE BIOGEL PI IND STRL 7.0 (GLOVE) ×3 IMPLANT
GLOVE BIOGEL PI IND STRL 8 (GLOVE) ×1 IMPLANT
GLOVE BIOGEL PI INDICATOR 7.0 (GLOVE) ×6
GLOVE BIOGEL PI INDICATOR 8 (GLOVE) ×2
GLOVE ECLIPSE 6.5 STRL STRAW (GLOVE) ×3 IMPLANT
GLOVE SURG SS PI 7.0 STRL IVOR (GLOVE) ×3 IMPLANT
GOWN STRL REUS W/ TWL LRG LVL3 (GOWN DISPOSABLE) ×2 IMPLANT
GOWN STRL REUS W/TWL LRG LVL3 (GOWN DISPOSABLE) ×4
GOWN STRL REUS W/TWL XL LVL3 (GOWN DISPOSABLE) ×3 IMPLANT
LOOP VESSEL MAXI BLUE (MISCELLANEOUS) IMPLANT
LOOP VESSEL MINI RED (MISCELLANEOUS) IMPLANT
NEEDLE HYPO 25X1 1.5 SAFETY (NEEDLE) ×3 IMPLANT
NS IRRIG 1000ML POUR BTL (IV SOLUTION) ×3 IMPLANT
PACK BASIN DAY SURGERY FS (CUSTOM PROCEDURE TRAY) ×3 IMPLANT
PADDING CAST ABS 3INX4YD NS (CAST SUPPLIES)
PADDING CAST ABS 4INX4YD NS (CAST SUPPLIES) ×2
PADDING CAST ABS COTTON 3X4 (CAST SUPPLIES) IMPLANT
PADDING CAST ABS COTTON 4X4 ST (CAST SUPPLIES) ×1 IMPLANT
PENCIL SMOKE EVACUATOR (MISCELLANEOUS) IMPLANT
SLEEVE SCD COMPRESS KNEE MED (MISCELLANEOUS) ×3 IMPLANT
SLING ARM FOAM STRAP LRG (SOFTGOODS) IMPLANT
SPEAR EYE SURG WECK-CEL (MISCELLANEOUS) IMPLANT
SPLINT FAST PLASTER 5X30 (CAST SUPPLIES)
SPLINT PLASTER CAST FAST 5X30 (CAST SUPPLIES) IMPLANT
SPLINT PLASTER CAST XFAST 3X15 (CAST SUPPLIES) ×8 IMPLANT
SPLINT PLASTER XTRA FASTSET 3X (CAST SUPPLIES) ×16
STOCKINETTE 6  STRL (DRAPES) ×2
STOCKINETTE 6 STRL (DRAPES) ×1 IMPLANT
SUT ETHIBOND 3-0 V-5 (SUTURE) IMPLANT
SUT ETHILON 8 0 BV130 4 (SUTURE) ×3 IMPLANT
SUT ETHILON 9 0 V 100.4 (SUTURE) IMPLANT
SUT FIBERWIRE 2-0 18 17.9 3/8 (SUTURE)
SUT MERSILENE 4 0 P 3 (SUTURE) IMPLANT
SUT NYLON 9 0 VRM6 (SUTURE) IMPLANT
SUT PROLENE 6 0 P 1 18 (SUTURE) ×3 IMPLANT
SUT SILK 4 0 PS 2 (SUTURE) IMPLANT
SUT STEEL 4 (SUTURE) IMPLANT
SUT SUPRAMID 3-0 (SUTURE) ×6 IMPLANT
SUT VICRYL 3-0 CR8 SH (SUTURE) ×3 IMPLANT
SUT VICRYL RAPIDE 4-0 (SUTURE) IMPLANT
SUT VICRYL RAPIDE 4/0 PS 2 (SUTURE) ×6 IMPLANT
SUTURE FIBERWR 2-0 18 17.9 3/8 (SUTURE) IMPLANT
SYR 10ML LL (SYRINGE) ×3 IMPLANT
SYR BULB 3OZ (MISCELLANEOUS) ×3 IMPLANT
TOWEL GREEN STERILE FF (TOWEL DISPOSABLE) ×3 IMPLANT
TUBE CONNECTING 20'X1/4 (TUBING)
TUBE CONNECTING 20X1/4 (TUBING) IMPLANT
TUBE FEEDING 8FR 16IN STR KANG (MISCELLANEOUS) IMPLANT
UNDERPAD 30X36 HEAVY ABSORB (UNDERPADS AND DIAPERS) ×3 IMPLANT

## 2019-03-07 NOTE — Anesthesia Postprocedure Evaluation (Signed)
Anesthesia Post Note  Patient: Erik Griffith  Procedure(s) Performed: LEFT THUMB FLEXOR TENDON REPAIR (Left Thumb) LEFT HAND NERVE REPAIR(S) (Left Thumb)     Patient location during evaluation: PACU Anesthesia Type: General Level of consciousness: awake and alert Pain management: pain level controlled Vital Signs Assessment: post-procedure vital signs reviewed and stable Respiratory status: spontaneous breathing, nonlabored ventilation, respiratory function stable and patient connected to nasal cannula oxygen Cardiovascular status: blood pressure returned to baseline and stable Postop Assessment: no apparent nausea or vomiting Anesthetic complications: no    Last Vitals:  Vitals:   03/07/19 1145 03/07/19 1220  BP: 109/67 (!) 148/95  Pulse: 73 65  Resp: 19 18  Temp:  36.8 C  SpO2: 94% 99%    Last Pain:  Vitals:   03/07/19 1220  TempSrc:   PainSc: 8                  Trevor Iha
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
## 2019-03-07 NOTE — Anesthesia Procedure Notes (Signed)
Procedure Name: LMA Insertion Date/Time: 03/07/2019 9:18 AM Performed by: Mayer Camel, CRNA Pre-anesthesia Checklist: Patient identified, Emergency Drugs available, Suction available and Patient being monitored Patient Re-evaluated:Patient Re-evaluated prior to induction Oxygen Delivery Method: Circle System Utilized Preoxygenation: Pre-oxygenation with 100% oxygen Induction Type: IV induction Ventilation: Mask ventilation without difficulty LMA: LMA inserted LMA Size: 5.0 Number of attempts: 1 Airway Equipment and Method: Bite block Placement Confirmation: positive ETCO2 Tube secured with: Tape Dental Injury: Teeth and Oropharynx as per pre-operative assessment

## 2019-03-07 NOTE — Transfer of Care (Signed)
Immediate Anesthesia Transfer of Care Note  Patient: Erik Griffith  Procedure(s) Performed: LEFT THUMB FLEXOR TENDON REPAIR (Left Thumb) LEFT HAND NERVE REPAIR(S) (Left Thumb)  Patient Location: PACU  Anesthesia Type:General  Level of Consciousness: awake, alert  and oriented  Airway & Oxygen Therapy: Patient Spontanous Breathing and Patient connected to face mask oxygen  Post-op Assessment: Report given to RN and Post -op Vital signs reviewed and stable  Post vital signs: Reviewed and stable  Last Vitals:  Vitals Value Taken Time  BP 146/86 03/07/19 1103  Temp    Pulse 98 03/07/19 1105  Resp 28 03/07/19 1105  SpO2 100 % 03/07/19 1105  Vitals shown include unvalidated device data.  Last Pain:  Vitals:   03/07/19 0751  TempSrc: Oral  PainSc: 7       Patients Stated Pain Goal: 3 (03/07/19 0751)  Complications: No apparent anesthesia complications

## 2019-03-07 NOTE — Interval H&P Note (Signed)
History and Physical Interval Note:  03/07/2019 8:59 AM  Erik Griffith  has presented today for surgery, with the diagnosis of LEFT  HAND LACERATION.  The various methods of treatment have been discussed with the patient and family. After consideration of risks, benefits and other options for treatment, the patient has consented to  Procedure(s): LEFT THUMB FLEXOR TENDON REPAIR (Left) LEFT HAND NERVE REPAIR(S) (Left) as a surgical intervention.  The patient's history has been reviewed, patient examined, no change in status, stable for surgery.  I have reviewed the patient's chart and labs.  Questions were answered to the patient's satisfaction.     [name]

## 2019-03-07 NOTE — Discharge Instructions (Signed)
*  No ibuprofen until after 6pm (written on pts paper and verbally told patient) *No tylenol until after 8pm (written on pts paper and verbally told patient)   Discharge Instructions   You have a dressing with a plaster splint incorporated in it. Move your fingers as much as possible, making a full fist and fully opening the fist. Elevate your hand to reduce pain & swelling of the digits.  Ice over the operative site may be helpful to reduce pain & swelling.  DO NOT USE HEAT. Pain medicine has been prescribed for you.  Use your medicine as needed over the first 48 hours, and then you can begin to taper your use.  You may use Tylenol in place of your prescribed pain medication, but not IN ADDITION to it. Leave the dressing in place until you return to our office.  You may shower, but keep the bandage clean & dry.  You may drive a car when you are off of prescription pain medications and can safely control your vehicle with both hands. Our office will call you to arrange follow-up   Please call [phone] during normal business hours or [phone] after hours for any problems. Including the following:  - excessive redness of the incisions - drainage for more than 4 days - fever of more than 101.5 F  *Please note that pain medications will not be refilled after hours or on weekends.   Post Anesthesia Home Care Instructions  Activity: Get plenty of rest for the remainder of the day. A responsible individual must stay with you for 24 hours following the procedure.  For the next 24 hours, DO NOT: -Drive a car -Advertising copywriter -Drink alcoholic beverages -Take any medication unless instructed by your physician -Make any legal decisions or sign important papers.  Meals: Start with liquid foods such as gelatin or soup. Progress to regular foods as tolerated. Avoid greasy, spicy, heavy foods. If nausea and/or vomiting occur, drink only clear liquids until the nausea and/or vomiting  subsides. Call your physician if vomiting continues.  Special Instructions/Symptoms: Your throat may feel dry or sore from the anesthesia or the breathing tube placed in your throat during surgery. If this causes discomfort, gargle with warm salt water. The discomfort should disappear within 24 hours.  If you had a scopolamine patch placed behind your ear for the management of post- operative nausea and/or vomiting:  1. The medication in the patch is effective for 72 hours, after which it should be removed.  Wrap patch in a tissue and discard in the trash. Wash hands thoroughly with soap and water. 2. You may remove the patch earlier than 72 hours if you experience unpleasant side effects which may include dry mouth, dizziness or visual disturbances. 3. Avoid touching the patch. Wash your hands with soap and water after contact with the patch.

## 2019-03-08 ENCOUNTER — Encounter: Payer: Self-pay | Admitting: *Deleted

## 2019-03-16 ENCOUNTER — Other Ambulatory Visit: Payer: Self-pay

## 2019-03-16 ENCOUNTER — Encounter: Payer: Self-pay | Admitting: *Deleted

## 2019-03-16 ENCOUNTER — Ambulatory Visit: Payer: 59 | Attending: Orthopedic Surgery | Admitting: *Deleted

## 2019-03-16 DIAGNOSIS — M25542 Pain in joints of left hand: Secondary | ICD-10-CM

## 2019-03-16 DIAGNOSIS — R600 Localized edema: Secondary | ICD-10-CM | POA: Insufficient documentation

## 2019-03-16 DIAGNOSIS — R278 Other lack of coordination: Secondary | ICD-10-CM | POA: Insufficient documentation

## 2019-03-16 DIAGNOSIS — M6281 Muscle weakness (generalized): Secondary | ICD-10-CM | POA: Insufficient documentation

## 2019-03-16 NOTE — Therapy (Signed)
[facility] [address] [place], [place], [address] Phone: [phone]   Fax:  [phone]  Occupational Therapy Evaluation  Patient Details  Name: Erik Griffith MRN: [id number] Date of Birth: 1985/07/05 Referring Provider (OT): Dr Grandville Silos   Encounter Date: 03/16/2019  OT End of Session - 03/16/19 1121    Visit Number  1    Number of Visits  16    Date for OT Re-Evaluation  05/11/19    Authorization Type  [employer] - per pt report, spouse said she gave card to front office today.    OT Start Time  [phone]    OT Stop Time  1100    OT Time Calculation (min)  81 min    Activity Tolerance  Patient tolerated treatment well    Behavior During Therapy  [facility] for tasks assessed/performed       History reviewed. No pertinent past medical history.  Past Surgical History:  Procedure Laterality Date  . APPENDECTOMY    . FLEXOR TENDON REPAIR Left 03/07/2019   Procedure: LEFT THUMB FLEXOR TENDON REPAIR;  Surgeon: Milly Jakob, MD;  Location: [facility];  Service: Orthopedics;  Laterality: Left;  . NERVE REPAIR Left 03/07/2019   Procedure: LEFT HAND NERVE REPAIR(S);  Surgeon: Milly Jakob, MD;  Location: [facility];  Service: Orthopedics;  Laterality: Left;    There were no vitals filed for this visit.  Subjective Assessment - 03/16/19 0942    Subjective   Pt is a 34 y/oR hand dominant male s/p laceration left FPL and thenar emminence as well a UDN left thumb on 02/27/2019. He underwent supgical repair on 03/07/2019.    Patient is accompanied by:  Family member   [place]   Pertinent History  No significant PMH    Patient Stated Goals  Get left hand use back. Functional use.    Currently in Pain?  Yes    Pain Score  7     Pain Location  Hand    Pain Orientation  Left    Pain Descriptors / Indicators  Tingling;Aching;Throbbing    Pain Type  Acute pain    Pain Onset  1 to 4 weeks ago    Pain  Frequency  Intermittent    Pain Relieving Factors  Elevation, not moving L UE seems to help and pain medications.    Multiple Pain Sites  No        [facility] OT Assessment - 03/16/19 0001      Assessment   Medical Diagnosis  L FPL repair, debridement of wound, repair of L 1st D1, repair of L adductor Pollicis, L repair flexor pollicis brevis, repair L abductor pollicis brevis, L FPL zone 3, L thumb UDN, L thenar motor branch nerve, repair L traumatic hand wound 11 cm.    Referring Provider (OT)  Dr Grandville Silos    Onset Date/Surgical Date  03/07/19   DOI : 02/27/2019   Hand Dominance  Right    Next MD Visit  03/17/2019      Precautions   Precautions  Other (comment)   as per flexor tendon healing     Restrictions   Weight Bearing Restrictions  Yes    LUE Weight Bearing  Non weight bearing      Balance Screen   Has the patient fallen in the past 6 months  No    Has the patient had a decrease in activity level because of a fear of falling?  No    Is the patient reluctant to leave their home because of a fear of falling?   No      Home  Environment   Lives With  Family      Prior Function   Level of Independence  Independent    Vocation  Unemployed   Herniated disc in neck   Vocation Requirements  Fork lift driver    Leisure  Basketball, plays with children      ADL   ADL comments  Pt is able to get assistance from family PRN      Mobility   Mobility Status  Independent      Written Expression   Dominant Hand  Right      Cognition   Overall Cognitive Status  Within Functional Limits for tasks assessed      Sensation   Light Touch  Impaired by gross assessment   Pt w/ c/o paresthesias (expected due to digial nerve repairs   Additional Comments  Cont to assess in functional context      Coordination   Gross Motor Movements are Fluid and Coordinated  Not tested    Fine Motor Movements are Fluid and Coordinated  Not tested    Coordination  Impaired secondary to tendon, nerve  and muscle repairs L thumb.      Edema   Edema  Min-moderate edem anoted L hand, thumb, digits as observed in clinic today.      Hand Function   Comment  No functional use Left hand at this time secondary to tendon/muscle and nerve repairs L thumb.               OT Treatments/Exercises (OP) - 03/16/19 0001      Splinting   Splinting  Pt's post-op brace/splint and dressings were removed from his left hand/thumb in clinic taking care to maintain thumb in protected position. Pt was w/ noted dried serosanguinous drainage on gauze but no active drainage was noted except from a small area on his volar thenar emminence just proximal to his MP. He will ask Dr Janee Morn to check this out tomorrow at his regular appointment. He was cleaned using sterile water and then dressed using 2x2's, gauze and stockinette. Pt was then fitted with a custom static splint that places his left wrist in palmar flexion, thumb MP ~15* flexion and IP in ~30* flexion with CMC slight palmar abduction. Pt and his wife were educated to not use his left hand for any activity at this time as well as splinting use, care and precautions. He was given both written and verbal instructions.         WEARING SCHEDULE:  Wear splint at ALL times except for hygiene care (May remove splint for exercises and then immediately place back on ONLY if directed by the therapist)  PURPOSE:  To prevent movement and for protection until injury can heal  CARE OF SPLINT:  Keep splint away from heat sources including: stove, radiator or furnace, or a car in sunlight. The splint can melt and will no longer fit you properly  Keep away from pets and children  Clean the splint with rubbing alcohol.  * During this time, make sure you also clean your hand/arm as instructed by your therapist and/or perform dressing changes as needed. Then dry hand/arm completely before replacing splint. (When cleaning hand/arm, keep it immobilized in same  position until splint is replaced)  PRECAUTIONS/POTENTIAL PROBLEMS: *If you notice or experience increased pain, swelling,  numbness, or a lingering reddened area from the splint: Contact your therapist immediately by calling [phone]. You must wear the splint for protection, but we will get you scheduled for adjustments as quickly as possible.  (If only straps or hooks need to be replaced and NO adjustments to the splint need to be made, just call the office ahead and let them know you are coming in)  If you have any medical concerns or signs of infection, please call your doctor immediately   OT Education - 03/16/19 1120    Education Details  Splint use, care and precautions left thumb/UE.    Person(s) Educated  Patient;Spouse    Methods  Explanation;Demonstration;Handout    Comprehension  Verbalized understanding       OT Short Term Goals - 03/16/19 1139      OT SHORT TERM GOAL #1   Title  Pt will be Mod I splinting use, care and precautions L thumb/forearm    Time  6    Period  Weeks    Status  New    Target Date  04/27/19      OT SHORT TERM GOAL #2   Title  Pt will be Mod I HEP L UE    Time  6    Period  Weeks    Status  New    Target Date  04/27/19      OT SHORT TERM GOAL #3   Title  Pt will be Mod I edema control L hand/UE    Time  6    Period  Weeks    Status  New    Target Date  04/27/19        OT Long Term Goals - 03/16/19 1141      OT LONG TERM GOAL #1   Title  Pt will be Mod I updated HEP L thumb/UE    Time  12    Period  Weeks    Status  New    Target Date  06/08/19      OT LONG TERM GOAL #2   Title  Pt will be Mod I scar management L thumb, volar and dorsal wrist    Time  12    Period  Weeks    Status  New    Target Date  06/08/19      OT LONG TERM GOAL #3   Title  Pt will demonstrate left thumb A/ROM WFL's as seen by Mod I opposition and flexion to base of small finger followed by extension in prep for increased participation w/  ADL/IADL's    Time  12    Period  Weeks    Status  New    Target Date  06/08/19      OT LONG TERM GOAL #4   Title  Pt will report improved sensibility as seen by ability to detect semmes weinstein monofilaments of 3.61 or better left volar and dorsal thumb.    Time  12    Period  Weeks    Status  New    Target Date  06/08/19      OT LONG TERM GOAL #5   Title  Pt will report pain L thumb as 3/10 or less during functional tasks during ADL/grasping tasks    Time  12    Period  Weeks    Status  New    Target Date  06/08/19            Plan - 03/16/19 1126  Clinical Impression Statement  Pt is a 34 y/o right hand dominant male referred for protective splinting by Dr Janee Morn. He is currently 10 days s/p left thumb exploration of traumatic knife wound; debridement of skin, SQ and muscle of left thenar emminence and repair of left first D1, repair left adductor pollicis, repair left flexor pollicis brevis, repair left abductor pollicis brevis, repair FPL zone 3, repair left thumb UDN, repair left thenar motor branch nerve and ntermediate repair of traumatic left hand wound, 11 cm on 03/07/2019 (his DOI: 02/27/2019 per pt/spouse report)., After removal of his post-op dressings in clinic, pt was redressed using 2x2, gauze and stockinette. He was then fitted with a custom fabricated left thumb dorsal blocking splint that places his wrist in ~20* palmar flexion, thumb MP ~15* flexion and IP ~30* flexion with CMC in slight palmar abduction. He presents with edema of his hand, thumb and fingers as well as scar/wound, tendon, muscle and nerve repairs, paresthesias and should benefit from continued out-pt OT to assist with splinting, pt/family education, home program as per tendon/muscle/wound nd nerve healing and upgrade of his home program to include exercise in the future. He plans to f/u with Dr Janee Morn on 03/17/2019 and will return here in 1 week for splint  check/adjustment and initiation of HEP as  able left thumb.    OT Occupational Profile and History  Problem Focused Assessment - Including review of records relating to presenting problem    Occupational performance deficits (Please refer to evaluation for details):  ADL's;IADL's    Rehab Potential  Good    Clinical Decision Making  Limited treatment options, no task modification necessary    Comorbidities Affecting Occupational Performance:  None    Modification or Assistance to Complete Evaluation   No modification of tasks or assist necessary to complete eval    OT Frequency  2x / week    OT Duration  12 weeks    OT Treatment/Interventions  Self-care/ADL training;Moist Heat;Fluidtherapy;Splinting;Therapeutic activities;Therapeutic exercise;Scar mobilization;Passive range of motion;Paraffin;Manual Therapy;Patient/family education    Plan  Splint check and adjustment, initiation HEP L thumb following FPL and muscle.nerve repairs as per [employer] program.    Consulted and Agree with Plan of Care  Patient;Family member/caregiver    Family Member Consulted  Spouse       Patient will benefit from skilled therapeutic intervention in order to improve the following deficits and impairments:           Visit Diagnosis: Pain in joint of left hand - Plan: Ot plan of care cert/re-cert  Other lack of coordination - Plan: Ot plan of care cert/re-cert  Muscle weakness (generalized) - Plan: Ot plan of care cert/re-cert  Localized edema - Plan: Ot plan of care cert/re-cert    Problem List Patient Active Problem List   Diagnosis Date Noted  . Neck pain 01/13/2019  . Neck strain, initial encounter 01/13/2019  . Back spasm 01/13/2019  . Lymphadenitis 01/13/2019    Lissie Hinesley Beth Dixon, OTR/L 03/16/2019, 11:52 AM  [facility] [address] [place], [place], [address] Phone: [phone]   Fax:  [phone]  Name: Aydeen Blume MRN: [id number] Date of Birth: May 27, 1985

## 2019-03-16 NOTE — Patient Instructions (Signed)
WEARING SCHEDULE:  Wear splint at ALL times except for hygiene care (May remove splint for exercises and then immediately place back on ONLY if directed by the therapist)  PURPOSE:  To prevent movement and for protection until injury can heal  CARE OF SPLINT:  Keep splint away from heat sources including: stove, radiator or furnace, or a car in sunlight. The splint can melt and will no longer fit you properly  Keep away from pets and children  Clean the splint with rubbing alcohol.  * During this time, make sure you also clean your hand/arm as instructed by your therapist and/or perform dressing changes as needed. Then dry hand/arm completely before replacing splint. (When cleaning hand/arm, keep it immobilized in same position until splint is replaced)  PRECAUTIONS/POTENTIAL PROBLEMS: *If you notice or experience increased pain, swelling, numbness, or a lingering reddened area from the splint: Contact your therapist immediately by calling [phone]. You must wear the splint for protection, but we will get you scheduled for adjustments as quickly as possible.  (If only straps or hooks need to be replaced and NO adjustments to the splint need to be made, just call the office ahead and let them know you are coming in)  If you have any medical concerns or signs of infection, please call your doctor immediately   

## 2019-03-22 ENCOUNTER — Encounter: Payer: Self-pay | Admitting: Occupational Therapy

## 2019-03-22 ENCOUNTER — Ambulatory Visit: Payer: 59 | Admitting: Occupational Therapy

## 2019-03-22 ENCOUNTER — Other Ambulatory Visit: Payer: Self-pay

## 2019-03-22 DIAGNOSIS — M25542 Pain in joints of left hand: Secondary | ICD-10-CM

## 2019-03-22 DIAGNOSIS — R278 Other lack of coordination: Secondary | ICD-10-CM

## 2019-03-22 DIAGNOSIS — M6281 Muscle weakness (generalized): Secondary | ICD-10-CM

## 2019-03-22 NOTE — Therapy (Signed)
[facility] [address] [place], [place], [address] Phone: [phone]   Fax:  [phone]  Occupational Therapy Treatment  Patient Details  Name: Erik Griffith MRN: [id number] Date of Birth: 30-Sep-1985 Referring Provider (OT): Dr Janee Morn   Encounter Date: 03/22/2019  OT End of Session - 03/22/19 1226    Visit Number  2    Number of Visits  16    Date for OT Re-Evaluation  05/11/19    Authorization Type  [employer] - per pt report, spouse said she gave card to front office today.    OT Start Time  [phone]    OT Stop Time  0925    OT Time Calculation (min)  19 min    Activity Tolerance  Patient tolerated treatment well    Behavior During Therapy  [facility] for tasks assessed/performed       History reviewed. No pertinent past medical history.  Past Surgical History:  Procedure Laterality Date  . APPENDECTOMY    . FLEXOR TENDON REPAIR Left 03/07/2019   Procedure: LEFT THUMB FLEXOR TENDON REPAIR;  Surgeon: Mack Hook, MD;  Location: [facility];  Service: Orthopedics;  Laterality: Left;  . NERVE REPAIR Left 03/07/2019   Procedure: LEFT HAND NERVE REPAIR(S);  Surgeon: Mack Hook, MD;  Location: [facility];  Service: Orthopedics;  Laterality: Left;    There were no vitals filed for this visit.  Subjective Assessment - 03/22/19 1222    Subjective   Pt arrived late for his appointment today. Therapist initiated P/ROM exercises within splint however due to time constraints pt will return tomorrow for splint modification.    Patient is accompanied by:  Family member   [place]   Pertinent History  No significant PMH    Patient Stated Goals  Get left hand use back. Functional use.    Currently in Pain?  Yes    Pain Score  4     Pain Location  Hand    Pain Orientation  Left    Pain Descriptors / Indicators  Aching    Pain Type  Acute pain    Pain Onset  1 to 4 weeks ago    Pain Frequency   Intermittent    Aggravating Factors   movement    Pain Relieving Factors  rest              Treatment: Pt arrived wearing splint. Splint and stockinette were removed and inccion was cleaned with saline and dried with 4 x 4 then dressed with stockinette. Pt's splint was re-applied and pt was educated in initial HEP for P/ROM in splint.             OT Education - 03/22/19 1228    Education Details  hand hygeine, splint wear and care and precautions, P/ROM flexion within splint as per FPL protocol (to IP, MP and compositely)    Person(s) Educated  Patient;Spouse    Methods  Explanation;Demonstration;Handout;Verbal cues    Comprehension  Verbalized understanding;Returned demonstration       OT Short Term Goals - 03/16/19 1139      OT SHORT TERM GOAL #1   Title  Pt will be Mod I splinting use, care and precautions L thumb/forearm    Time  6    Period  Weeks    Status  New    Target Date  04/27/19      OT SHORT TERM GOAL #2   Title  Pt will  be Mod I HEP L UE    Time  6    Period  Weeks    Status  New    Target Date  04/27/19      OT SHORT TERM GOAL #3   Title  Pt will be Mod I edema control L hand/UE    Time  6    Period  Weeks    Status  New    Target Date  04/27/19        OT Long Term Goals - 03/16/19 1141      OT LONG TERM GOAL #1   Title  Pt will be Mod I updated HEP L thumb/UE    Time  12    Period  Weeks    Status  New    Target Date  06/08/19      OT LONG TERM GOAL #2   Title  Pt will be Mod I scar management L thumb, volar and dorsal wrist    Time  12    Period  Weeks    Status  New    Target Date  06/08/19      OT LONG TERM GOAL #3   Title  Pt will demonstrate left thumb A/ROM WFL's as seen by Mod I opposition and flexion to base of small finger followed by extension in prep for increased participation w/ ADL/IADL's    Time  12    Period  Weeks    Status  New    Target Date  06/08/19      OT LONG TERM GOAL #4   Title  Pt will  report improved sensibility as seen by ability to detect semmes weinstein monofilaments of 3.61 or better left volar and dorsal thumb.    Time  12    Period  Weeks    Status  New    Target Date  06/08/19      OT LONG TERM GOAL #5   Title  Pt will report pain L thumb as 3/10 or less during functional tasks during ADL/grasping tasks    Time  12    Period  Weeks    Status  New    Target Date  06/08/19            Plan - 03/22/19 1226    Clinical Impression Statement  Pt arrived late for appointment today. Pt has new orders for splint modification, however due to time constraints, pt will need to return tomorrow for splint modification and progression to A/ROM within splint. Pt was instructed in P/ROM in splint per FPL protocol.    OT Occupational Profile and History  Problem Focused Assessment - Including review of records relating to presenting problem    Occupational performance deficits (Please refer to evaluation for details):  ADL's;IADL's    Body Structure / Function / Physical Skills  ADL;UE functional use;Flexibility;Pain;FMC;ROM;GMC;Coordination;IADL;Dexterity;Strength    Rehab Potential  Good    Clinical Decision Making  Limited treatment options, no task modification necessary    Comorbidities Affecting Occupational Performance:  None    Modification or Assistance to Complete Evaluation   No modification of tasks or assist necessary to complete eval    OT Frequency  2x / week    OT Duration  12 weeks    OT Treatment/Interventions  Self-care/ADL training;Moist Heat;Fluidtherapy;Splinting;Therapeutic activities;Therapeutic exercise;Scar mobilization;Passive range of motion;Paraffin;Manual Therapy;Patient/family education    Plan  modify splint as per MD orders, progress to A/ROM in confines of splint per MD orders.  Consulted and Agree with Plan of Care  Patient;Family member/caregiver    Family Member Consulted  Spouse       Patient will benefit from skilled therapeutic  intervention in order to improve the following deficits and impairments:   Body Structure / Function / Physical Skills: ADL, UE functional use, Flexibility, Pain, FMC, ROM, GMC, Coordination, IADL, Dexterity, Strength       Visit Diagnosis: Pain in joint of left hand  Other lack of coordination  Muscle weakness (generalized)    Problem List Patient Active Problem List   Diagnosis Date Noted  . Neck pain 01/13/2019  . Neck strain, initial encounter 01/13/2019  . Back spasm 01/13/2019  . Lymphadenitis 01/13/2019    Nawal Burling 03/22/2019, 12:32 PM  [facility] [address] [place], [place], [address] Phone: [phone]   Fax:  [phone]  Name: Erik Griffith MRN: [id number] Date of Birth: Dec 22, 1985

## 2019-03-23 ENCOUNTER — Encounter: Payer: Self-pay | Admitting: *Deleted

## 2019-03-23 ENCOUNTER — Ambulatory Visit: Payer: 59 | Admitting: *Deleted

## 2019-03-23 DIAGNOSIS — R600 Localized edema: Secondary | ICD-10-CM

## 2019-03-23 DIAGNOSIS — M6281 Muscle weakness (generalized): Secondary | ICD-10-CM

## 2019-03-23 DIAGNOSIS — R278 Other lack of coordination: Secondary | ICD-10-CM

## 2019-03-23 DIAGNOSIS — M25542 Pain in joints of left hand: Secondary | ICD-10-CM

## 2019-03-23 NOTE — Therapy (Signed)
[facility] [address] [place], [place], [address] Phone: [phone]   Fax:  [phone]  Occupational Therapy Treatment  Patient Details  Name: Erik Griffith MRN: [id number] Date of Birth: 08-31-1985 Referring Provider (OT): Dr Grandville Silos   Encounter Date: 03/23/2019  OT End of Session - 03/23/19 0849    Visit Number  3    Number of Visits  16    Date for OT Re-Evaluation  05/11/19    Authorization Type  [employer] - per pt report, spouse said she gave card to front office at [place] date..    OT Start Time  [phone]    OT Stop Time  [phone]    OT Time Calculation (min)  37 min    Activity Tolerance  Patient tolerated treatment well    Behavior During Therapy  [facility] for tasks assessed/performed       History reviewed. No pertinent past medical history.  Past Surgical History:  Procedure Laterality Date  . APPENDECTOMY    . FLEXOR TENDON REPAIR Left 03/07/2019   Procedure: LEFT THUMB FLEXOR TENDON REPAIR;  Surgeon: Milly Jakob, MD;  Location: [facility];  Service: Orthopedics;  Laterality: Left;  . NERVE REPAIR Left 03/07/2019   Procedure: LEFT HAND NERVE REPAIR(S);  Surgeon: Milly Jakob, MD;  Location: [facility];  Service: Orthopedics;  Laterality: Left;    There were no vitals filed for this visit.  Subjective Assessment - 03/23/19 0804    Subjective   Pt reports performing a HEP about 2x/day for FPL repair L thumb. Reportinh pain as 5/10.    Patient is accompanied by:  Family member    Pertinent History  No significant PMH    Patient Stated Goals  Get left hand use back. Functional use.    Currently in Pain?  Yes    Pain Score  5     Pain Location  Hand    Pain Orientation  Left    Pain Descriptors / Indicators  Throbbing;Tingling    Pain Type  Acute pain    Pain Onset  1 to 4 weeks ago    Pain Frequency  Intermittent    Multiple Pain Sites  No       OT Treatments/Exercises  (OP) - 03/23/19 0001      Exercises   Exercises  Hand   All ex's - L thumb within confines of dorsal blocking splint     Hand Exercises   Other Hand Exercises  Gentle passive IP, MP and composite flexion of thumb left within splint x10 reps each. Added gentle active thumb flexion followed by active extension within dorsal blocking splint as per updated MD orders to begin this. Min verbal cues for pt to perform all HEP gently and slowly, holding each for 3-5 seconds stretch rather than fast/jerky movements. Pt was able to demonstrate understanding of this in clinic today. Pt spouse was present throughout and verbalied understanding of this as well. Pt was educated that treating therapists are awaiting clarification of orders at this time and some minor adjustments to home program may be necessary prior to his return to the clinic next week. He verbalized understanding of this as well.      Splinting   Splinting  Pt protective splint was removed in clinic while maintaining L thumb and forearm in protected position on table top. Pt splint was reheated and adjusted to allow for increased palmar ABD/increased 1st web space stretch left thumb. Pt  was instructed in splintin use, care and precautions and verbalized understanding in clinic today. Pt wil lcontinue to wear splint at all times for protection, including during home ex program as described/performed above.        OT Education - 03/23/19 0847    Education Details  hand hygeine, splint wear and care and precautions, P/ROM flexion within splint as per FPL protocol (to IP, MP and compositely). Added left gentle active thumb flexion within dorsal blocking splint per updated MD/PA-C orders on 03/21/2019. Splint adjustments to assist w/ increasing L thumb palmar ABD.    Person(s) Educated  Patient;Spouse    Methods  Explanation;Demonstration;Handout;Verbal cues    Comprehension  Verbalized understanding;Returned demonstration       OT Short Term  Goals - 03/16/19 1139      OT SHORT TERM GOAL #1   Title  Pt will be Mod I splinting use, care and precautions L thumb/forearm    Time  6    Period  Weeks    Status  New    Target Date  04/27/19      OT SHORT TERM GOAL #2   Title  Pt will be Mod I HEP L UE    Time  6    Period  Weeks    Status  New    Target Date  04/27/19      OT SHORT TERM GOAL #3   Title  Pt will be Mod I edema control L hand/UE    Time  6    Period  Weeks    Status  New    Target Date  04/27/19        OT Long Term Goals - 03/16/19 1141      OT LONG TERM GOAL #1   Title  Pt will be Mod I updated HEP L thumb/UE    Time  12    Period  Weeks    Status  New    Target Date  06/08/19      OT LONG TERM GOAL #2   Title  Pt will be Mod I scar management L thumb, volar and dorsal wrist    Time  12    Period  Weeks    Status  New    Target Date  06/08/19      OT LONG TERM GOAL #3   Title  Pt will demonstrate left thumb A/ROM WFL's as seen by Mod I opposition and flexion to base of small finger followed by extension in prep for increased participation w/ ADL/IADL's    Time  12    Period  Weeks    Status  New    Target Date  06/08/19      OT LONG TERM GOAL #4   Title  Pt will report improved sensibility as seen by ability to detect semmes weinstein monofilaments of 3.61 or better left volar and dorsal thumb.    Time  12    Period  Weeks    Status  New    Target Date  06/08/19      OT LONG TERM GOAL #5   Title  Pt will report pain L thumb as 3/10 or less during functional tasks during ADL/grasping tasks    Time  12    Period  Weeks    Status  New    Target Date  06/08/19        Plan - 03/23/19 0850    Clinical Impression Statement  Pt dorsal  blocking splint left thumb was adjusted to allow for increased palmar ABD, review of splint use, care and precautions as well as instructions for upgraded HEP to include review of current home program and added gentle active range of motion Left thumb in  splint as per MD/PA-C orders dated 03/21/2019. Pt will benefit from cont out-pt therapy to address home program and progression of program as per clarification of orders by MD/PA-C to include FPL protocol as well as muscle repair to thenar emminence of left non-dominant hand.    OT Occupational Profile and History  Problem Focused Assessment - Including review of records relating to presenting problem    Occupational performance deficits (Please refer to evaluation for details):  ADL's;IADL's    Body Structure / Function / Physical Skills  ADL;UE functional use;Flexibility;Pain;FMC;ROM;GMC;Coordination;IADL;Dexterity;Strength;Scar mobility    Rehab Potential  Good    Clinical Decision Making  Limited treatment options, no task modification necessary    Comorbidities Affecting Occupational Performance:  None    Modification or Assistance to Complete Evaluation   No modification of tasks or assist necessary to complete eval    OT Frequency  2x / week    OT Duration  12 weeks    OT Treatment/Interventions  Self-care/ADL training;Moist Heat;Fluidtherapy;Splinting;Therapeutic activities;Therapeutic exercise;Scar mobilization;Passive range of motion;Paraffin;Manual Therapy;Patient/family education    Plan  Splint check and modifications to allow for increased palmar ABD/functional positioning of thumb as well as upgrade HEP within confines of dorsal blocking splint left thumb.    Consulted and Agree with Plan of Care  Patient;Family member/caregiver    Family Member Consulted  Spouse       Patient will benefit from skilled therapeutic intervention in order to improve the following deficits and impairments:   Body Structure / Function / Physical Skills: ADL, UE functional use, Flexibility, Pain, FMC, ROM, GMC, Coordination, IADL, Dexterity, Strength, Scar mobility       Visit Diagnosis: Pain in joint of left hand  Other lack of coordination  Muscle weakness (generalized)  Localized  edema    Problem List Patient Active Problem List   Diagnosis Date Noted  . Neck pain 01/13/2019  . Neck strain, initial encounter 01/13/2019  . Back spasm 01/13/2019  . Lymphadenitis 01/13/2019    Rocklin Soderquist Beth Dixon, OTR/L 03/23/2019, 9:01 AM  [facility] [address] [place], [place], [address] Phone: [phone]   Fax:  [phone]  Name: Erik Griffith MRN: [id number] Date of Birth: [birth year]

## 2019-03-24 ENCOUNTER — Ambulatory Visit: Payer: 59 | Admitting: Occupational Therapy

## 2019-03-25 ENCOUNTER — Ambulatory Visit: Payer: 59 | Admitting: Occupational Therapy

## 2019-03-30 ENCOUNTER — Other Ambulatory Visit: Payer: Self-pay

## 2019-03-30 ENCOUNTER — Ambulatory Visit: Payer: 59 | Admitting: Occupational Therapy

## 2019-03-30 DIAGNOSIS — M25542 Pain in joints of left hand: Secondary | ICD-10-CM

## 2019-03-30 DIAGNOSIS — M6281 Muscle weakness (generalized): Secondary | ICD-10-CM

## 2019-03-30 NOTE — Therapy (Signed)
[facility] [address] [place], [place], [address] Phone: [phone]   Fax:  [phone]  Occupational Therapy Treatment  Patient Details  Name: Erik Griffith MRN: [id number] Date of Birth: 04/17/1985 Referring Provider (OT): Dr Janee Morn   Encounter Date: 03/30/2019  OT End of Session - 03/30/19 1221    Visit Number  4    Number of Visits  16    Date for OT Re-Evaluation  05/11/19    Authorization Type  [employer] - per pt report, spouse said she gave card to front office at [place] date..    OT Start Time  0930    OT Stop Time  1000    OT Time Calculation (min)  30 min    Activity Tolerance  Patient tolerated treatment well    Behavior During Therapy  WFL for tasks assessed/performed       No past medical history on file.  Past Surgical History:  Procedure Laterality Date  . APPENDECTOMY    . FLEXOR TENDON REPAIR Left 03/07/2019   Procedure: LEFT THUMB FLEXOR TENDON REPAIR;  Surgeon: Mack Hook, MD;  Location: [facility];  Service: Orthopedics;  Laterality: Left;  . NERVE REPAIR Left 03/07/2019   Procedure: LEFT HAND NERVE REPAIR(S);  Surgeon: Mack Hook, MD;  Location: [facility];  Service: Orthopedics;  Laterality: Left;    There were no vitals filed for this visit.  Subjective Assessment - 03/30/19 0936    Patient is accompanied by:  Family member    Pertinent History  FPL Repair and thenar muscle repair on 03/07/19. No significant PMH    Patient Stated Goals  Get left hand use back. Functional use.    Currently in Pain?  Yes    Pain Score  4     Pain Location  Hand    Pain Orientation  Left    Pain Descriptors / Indicators  Throbbing;Tingling;Sore    Pain Type  Acute pain    Pain Onset  1 to 4 weeks ago    Pain Frequency  Intermittent    Aggravating Factors   movement    Pain Relieving Factors  rest        Pt return demo of A/ROM and P/ROM within constraints of  dorsal block splint (DBS) per protocol and clearance from MD. Pt can now oppose to 5th digit but struggled more with thumb flexion due to edema and site of muscle repair. Pt shown place and hold ex to increase thumb flexion. Pt returned demo.   Adjusted strapping and added new straps for thumb and palm.                      OT Short Term Goals - 03/30/19 1221      OT SHORT TERM GOAL #1   Title  Pt will be Mod I splinting use, care and precautions L thumb/forearm    Time  6    Period  Weeks    Status  Achieved    Target Date  04/27/19      OT SHORT TERM GOAL #2   Title  Pt will be Mod I HEP L UE    Time  6    Period  Weeks    Status  On-going    Target Date  04/27/19      OT SHORT TERM GOAL #3   Title  Pt will be Mod I edema control L hand/UE  Time  6    Period  Weeks    Status  On-going    Target Date  04/27/19        OT Long Term Goals - 03/16/19 1141      OT LONG TERM GOAL #1   Title  Pt will be Mod I updated HEP L thumb/UE    Time  12    Period  Weeks    Status  New    Target Date  06/08/19      OT LONG TERM GOAL #2   Title  Pt will be Mod I scar management L thumb, volar and dorsal wrist    Time  12    Period  Weeks    Status  New    Target Date  06/08/19      OT LONG TERM GOAL #3   Title  Pt will demonstrate left thumb A/ROM WFL's as seen by Mod I opposition and flexion to base of small finger followed by extension in prep for increased participation w/ ADL/IADL's    Time  12    Period  Weeks    Status  New    Target Date  06/08/19      OT LONG TERM GOAL #4   Title  Pt will report improved sensibility as seen by ability to detect semmes weinstein monofilaments of 3.61 or better left volar and dorsal thumb.    Time  12    Period  Weeks    Status  New    Target Date  06/08/19      OT LONG TERM GOAL #5   Title  Pt will report pain L thumb as 3/10 or less during functional tasks during ADL/grasping tasks    Time  12    Period  Weeks     Status  New    Target Date  06/08/19            Plan - 03/30/19 1221    Clinical Impression Statement  Pt progressing per protocol and demo good active and passive ROM within constraints of DBS    Occupational performance deficits (Please refer to evaluation for details):  ADL's;IADL's    Body Structure / Function / Physical Skills  ADL;UE functional use;Flexibility;Pain;FMC;ROM;GMC;Coordination;IADL;Dexterity;Strength;Scar mobility    Rehab Potential  Good    Comorbidities Affecting Occupational Performance:  None    OT Frequency  2x / week    OT Duration  12 weeks    OT Treatment/Interventions  Self-care/ADL training;Moist Heat;Fluidtherapy;Splinting;Therapeutic activities;Therapeutic exercise;Scar mobilization;Passive range of motion;Paraffin;Manual Therapy;Patient/family education    Plan  Progress per protocol to A/ROM outside splint next week    Consulted and Agree with Plan of Care  Patient;Family member/caregiver    Family Member Consulted  Spouse       Patient will benefit from skilled therapeutic intervention in order to improve the following deficits and impairments:   Body Structure / Function / Physical Skills: ADL, UE functional use, Flexibility, Pain, FMC, ROM, GMC, Coordination, IADL, Dexterity, Strength, Scar mobility       Visit Diagnosis: Pain in joint of left hand  Muscle weakness (generalized)    Problem List Patient Active Problem List   Diagnosis Date Noted  . Neck pain 01/13/2019  . Neck strain, initial encounter 01/13/2019  . Back spasm 01/13/2019  . Lymphadenitis 01/13/2019    Kelli Churn, OTR/L 03/30/2019, 12:23 PM  [facility] [address] [place], [place], [address] Phone: [phone]  Fax:  [phone]  Name: Erik Griffith MRN: [id number] Date of Birth: 06/27/1985

## 2019-04-04 ENCOUNTER — Ambulatory Visit: Payer: 59 | Admitting: Occupational Therapy

## 2019-04-06 ENCOUNTER — Ambulatory Visit: Payer: 59 | Admitting: Occupational Therapy

## 2019-04-12 ENCOUNTER — Ambulatory Visit: Payer: 59 | Attending: Orthopedic Surgery | Admitting: Occupational Therapy

## 2019-04-12 ENCOUNTER — Encounter: Payer: Self-pay | Admitting: Occupational Therapy

## 2019-04-12 ENCOUNTER — Other Ambulatory Visit: Payer: Self-pay

## 2019-04-12 DIAGNOSIS — R600 Localized edema: Secondary | ICD-10-CM

## 2019-04-12 DIAGNOSIS — M25542 Pain in joints of left hand: Secondary | ICD-10-CM | POA: Insufficient documentation

## 2019-04-12 DIAGNOSIS — R278 Other lack of coordination: Secondary | ICD-10-CM | POA: Insufficient documentation

## 2019-04-12 DIAGNOSIS — M6281 Muscle weakness (generalized): Secondary | ICD-10-CM | POA: Insufficient documentation

## 2019-04-12 NOTE — Therapy (Signed)
[facility] [address] [place], [place], [address] Phone: [phone]   Fax:  [phone]  Occupational Therapy Treatment  Patient Details  Name: Erik Griffith MRN: [id number] Date of Birth: [birth year] Referring Provider (OT): Dr Janee Morn   Encounter Date: 04/12/2019  OT End of Session - 04/12/19 0816    Visit Number  5    Number of Visits  16    Date for OT Re-Evaluation  05/11/19    Authorization Type  [employer] - per pt report, spouse said she gave card to front office at Eval date..    OT Start Time  3419    OT Stop Time  0845    OT Time Calculation (min)  40 min    Activity Tolerance  Patient tolerated treatment well    Behavior During Therapy  [facility] for tasks assessed/performed       History reviewed. No pertinent past medical history.  Past Surgical History:  Procedure Laterality Date  . APPENDECTOMY    . FLEXOR TENDON REPAIR Left 03/07/2019   Procedure: LEFT THUMB FLEXOR TENDON REPAIR;  Surgeon: Mack Hook, MD;  Location: [facility];  Service: Orthopedics;  Laterality: Left;  . NERVE REPAIR Left 03/07/2019   Procedure: LEFT HAND NERVE REPAIR(S);  Surgeon: Mack Hook, MD;  Location: [facility];  Service: Orthopedics;  Laterality: Left;    There were no vitals filed for this visit.  Subjective Assessment - 04/12/19 0815    Subjective   Denies pain    Patient is accompanied by:  Family member    Pertinent History  FPL Repair and thenar muscle repair on 03/07/19. No significant PMH    Patient Stated Goals  Get left hand use back. Functional use.    Currently in Pain?  No/denies    Pain Onset  1 to 4 weeks ago               Treatment:Fluidotherapy x 9 mins to LUE for stiffness, desensitization.No adverse reactions [place] , 0.8 w/cm 2, 20% x 8 mins, to palm, thenar eminence and thumb, pt is hypersensitive over incision site. Pt was instructed in scar massage to  plam and incision sites. Reviewed P/ROM in splint and upgraded to A/ROM exercises outside of splint. Pt returned demonstration. Pt was instructed to wear splint in between exercises and to continue to avoid use of LUE when out of splint. Pt verbalized understanding.             OT Education - 04/12/19 0858    Education Details  Reveiwed P/ROM exercises in splint, educated pt in A/ROM exercises outside of splint(4 1/2 weeks per protocol ) as pt missed last weeks visit, pt was instructed in scar massage.    Person(s) Educated  Patient;Spouse    Methods  Explanation;Demonstration;Handout;Verbal cues    Comprehension  Verbalized understanding;Returned demonstration;Verbal cues required       OT Short Term Goals - 03/30/19 1221      OT SHORT TERM GOAL #1   Title  Pt will be Mod I splinting use, care and precautions L thumb/forearm    Time  6    Period  Weeks    Status  Achieved    Target Date  04/27/19      OT SHORT TERM GOAL #2   Title  Pt will be Mod I HEP L UE    Time  6    Period  Weeks    Status  On-going  Target Date  04/27/19      OT SHORT TERM GOAL #3   Title  Pt will be Mod I edema control L hand/UE    Time  6    Period  Weeks    Status  On-going    Target Date  04/27/19        OT Long Term Goals - 03/16/19 1141      OT LONG TERM GOAL #1   Title  Pt will be Mod I updated HEP L thumb/UE    Time  12    Period  Weeks    Status  New    Target Date  06/08/19      OT LONG TERM GOAL #2   Title  Pt will be Mod I scar management L thumb, volar and dorsal wrist    Time  12    Period  Weeks    Status  New    Target Date  06/08/19      OT LONG TERM GOAL #3   Title  Pt will demonstrate left thumb A/ROM WFL's as seen by Mod I opposition and flexion to base of small finger followed by extension in prep for increased participation w/ ADL/IADL's    Time  12    Period  Weeks    Status  New    Target Date  06/08/19      OT LONG TERM GOAL #4   Title  Pt will  report improved sensibility as seen by ability to detect semmes weinstein monofilaments of 3.61 or better left volar and dorsal thumb.    Time  12    Period  Weeks    Status  New    Target Date  06/08/19      OT LONG TERM GOAL #5   Title  Pt will report pain L thumb as 3/10 or less during functional tasks during ADL/grasping tasks    Time  12    Period  Weeks    Status  New    Target Date  06/08/19              Patient will benefit from skilled therapeutic intervention in order to improve the following deficits and impairments:           Visit Diagnosis: Muscle weakness (generalized)  Pain in joint of left hand  Other lack of coordination  Localized edema    Problem List Patient Active Problem List   Diagnosis Date Noted  . Neck pain 01/13/2019  . Neck strain, initial encounter 01/13/2019  . Back spasm 01/13/2019  . Lymphadenitis 01/13/2019    Ruddy Swire 04/12/2019, 8:59 AM  [facility] [address] [place], [place], [address] Phone: [phone]   Fax:  [phone]  Name: Curren Mohrmann MRN: [id number] Date of Birth: 1985/04/04

## 2019-04-14 ENCOUNTER — Ambulatory Visit: Payer: 59 | Admitting: Occupational Therapy

## 2019-04-18 ENCOUNTER — Ambulatory Visit: Payer: 59 | Admitting: Occupational Therapy

## 2019-04-20 ENCOUNTER — Ambulatory Visit: Payer: 59 | Admitting: Occupational Therapy

## 2019-04-25 ENCOUNTER — Other Ambulatory Visit: Payer: Self-pay

## 2019-04-25 ENCOUNTER — Ambulatory Visit: Payer: 59 | Admitting: Occupational Therapy

## 2019-04-25 DIAGNOSIS — R278 Other lack of coordination: Secondary | ICD-10-CM

## 2019-04-25 DIAGNOSIS — M25542 Pain in joints of left hand: Secondary | ICD-10-CM

## 2019-04-25 DIAGNOSIS — R600 Localized edema: Secondary | ICD-10-CM

## 2019-04-25 NOTE — Therapy (Signed)
[facility] [address] [place], [place], [address] Phone: [phone]   Fax:  [phone]  Occupational Therapy Treatment  Patient Details  Name: Erik Griffith MRN: [id number] Date of Birth: [birth year] Referring Provider (OT): Dr Grandville Silos   Encounter Date: 04/25/2019  OT End of Session - 04/25/19 0955    Visit Number  6    Number of Visits  16    Date for OT Re-Evaluation  05/11/19    Authorization Type  [employer] -therapist recommended pt/ wife calls insurance company to veryify benefits per appt. notes on 04/12/19    OT Start Time  0850    OT Stop Time  0930    OT Time Calculation (min)  40 min    Activity Tolerance  Patient tolerated treatment well    Behavior During Therapy  [facility] for tasks assessed/performed       No past medical history on file.  Past Surgical History:  Procedure Laterality Date  . APPENDECTOMY    . FLEXOR TENDON REPAIR Left 03/07/2019   Procedure: LEFT THUMB FLEXOR TENDON REPAIR;  Surgeon: Milly Jakob, MD;  Location: [facility];  Service: Orthopedics;  Laterality: Left;  . NERVE REPAIR Left 03/07/2019   Procedure: LEFT HAND NERVE REPAIR(S);  Surgeon: Milly Jakob, MD;  Location: [facility];  Service: Orthopedics;  Laterality: Left;    There were no vitals filed for this visit.  Subjective Assessment - 04/25/19 0855    Subjective   Pt's wife reports that he went to lift his daughter w/ both hands last week and his thumb has been hurting since (around 04/21/19)    Patient is accompanied by:  Family member    Pertinent History  FPL Repair and thenar muscle repair on 03/07/19. No significant PMH    Patient Stated Goals  Get left hand use back. Functional use.    Currently in Pain?  Yes    Pain Score  4     Pain Location  --   THUMB   Pain Orientation  Left    Pain Descriptors / Indicators  Aching;Throbbing    Pain Type  Acute pain    Pain Onset  In the past 7  days    Pain Frequency  Intermittent    Aggravating Factors   Since picking up daughter w/ both hands (therapist advised not to do this or any strengthening (gripping, lifting, pulling, etc) at this time with Lt hand   Pain Relieving Factors  rest       Pt now 7 weeks post-op. Pt missed appointment last week. D/C dorsal block splint at this time per protocol. Reviewed scar massage and importance of this to reduce scar tissue as pt is very tight along incision and restricted in radial abduction, extension and IP extension of Lt thumb.  Fabricated and fitted static wrist and thumb splint to achieve as much radial abduction and IP extension of thumb as possible and instructed to wear at night. Pt instructed to build up tolerance during the day today first, then switch to just pm wear.  Also emphasized A/ROM and gentle P/ROM in thumb radial abduction and blocking ex's for isolated IP flexion and extension. Pt encouraged to also continue thumb flexion, opposition, and palmer abduction.                     OT Education - 04/25/19 0953    Education Details  A/ROM specifically in radial abduction (but  also full thumb flexion and palmer abd) and isolated IP flexion/extension of thumb, splint wear and care for pm wear (to increase radial abduction), and scar massage review    Person(s) Educated  Patient;Spouse    Methods  Explanation;Demonstration;Verbal cues    Comprehension  Verbalized understanding;Returned demonstration;Verbal cues required       OT Short Term Goals - 04/25/19 1002      OT SHORT TERM GOAL #1   Title  Pt will be Mod I splinting use, care and precautions L thumb/forearm    Time  6    Period  Weeks    Status  Achieved    Target Date  04/27/19      OT SHORT TERM GOAL #2   Title  Pt will be Mod I HEP L UE    Time  6    Period  Weeks    Status  Achieved    Target Date  04/27/19      OT SHORT TERM GOAL #3   Title  Pt will be Mod I edema control L hand/UE     Time  6    Period  Weeks    Status  Achieved    Target Date  04/27/19        OT Long Term Goals - 03/16/19 1141      OT LONG TERM GOAL #1   Title  Pt will be Mod I updated HEP L thumb/UE    Time  12    Period  Weeks    Status  New    Target Date  06/08/19      OT LONG TERM GOAL #2   Title  Pt will be Mod I scar management L thumb, volar and dorsal wrist    Time  12    Period  Weeks    Status  New    Target Date  06/08/19      OT LONG TERM GOAL #3   Title  Pt will demonstrate left thumb A/ROM WFL's as seen by Mod I opposition and flexion to base of small finger followed by extension in prep for increased participation w/ ADL/IADL's    Time  12    Period  Weeks    Status  New    Target Date  06/08/19      OT LONG TERM GOAL #4   Title  Pt will report improved sensibility as seen by ability to detect semmes weinstein monofilaments of 3.61 or better left volar and dorsal thumb.    Time  12    Period  Weeks    Status  New    Target Date  06/08/19      OT LONG TERM GOAL #5   Title  Pt will report pain L thumb as 3/10 or less during functional tasks during ADL/grasping tasks    Time  12    Period  Weeks    Status  New    Target Date  06/08/19            Plan - 04/25/19 1003    Clinical Impression Statement  Pt has met all STG's. Pt is now 7 weeks post-op and is very tight with thumb radial abduction and extension, as well as IP extension. Pt missed therapy session last week. Pt instructed to d/c dorsal block splint at this time and fabricated wrist and thumb static splint for as much radial abduction as able (per protocol)    Occupational performance deficits (Please refer  to evaluation for details):  ADL's;IADL's    Body Structure / Function / Physical Skills  ADL;UE functional use;Flexibility;Pain;FMC;ROM;GMC;Coordination;IADL;Dexterity;Strength;Scar mobility    Rehab Potential  Good    Comorbidities Affecting Occupational Performance:  None    OT Frequency  2x /  week    OT Duration  12 weeks    OT Treatment/Interventions  Self-care/ADL training;Moist Heat;Fluidtherapy;Splinting;Therapeutic activities;Therapeutic exercise;Scar mobilization;Passive range of motion;Paraffin;Manual Therapy;Patient/family education    Plan  continued pulsed [place] to incision, continue A/ROM and P/ROM to thumb, adjust splint prn    Consulted and Agree with Plan of Care  Patient;Family member/caregiver    Family Member Consulted  Spouse       Patient will benefit from skilled therapeutic intervention in order to improve the following deficits and impairments:   Body Structure / Function / Physical Skills: ADL, UE functional use, Flexibility, Pain, FMC, ROM, GMC, Coordination, IADL, Dexterity, Strength, Scar mobility       Visit Diagnosis: Pain in joint of left hand  Localized edema  Other lack of coordination    Problem List Patient Active Problem List   Diagnosis Date Noted  . Neck pain 01/13/2019  . Neck strain, initial encounter 01/13/2019  . Back spasm 01/13/2019  . Lymphadenitis 01/13/2019    Carey Bullocks, OTR/L 04/25/2019, 10:11 AM  [facility] [address] [place], [place], [address] Phone: [phone]   Fax:  [phone]  Name: Erik Griffith MRN: [id number] Date of Birth: 1986-01-31

## 2019-04-27 ENCOUNTER — Ambulatory Visit: Payer: 59 | Admitting: Occupational Therapy

## 2019-05-02 ENCOUNTER — Other Ambulatory Visit: Payer: Self-pay

## 2019-05-02 ENCOUNTER — Ambulatory Visit: Payer: 59 | Admitting: Occupational Therapy

## 2019-05-02 DIAGNOSIS — R278 Other lack of coordination: Secondary | ICD-10-CM

## 2019-05-02 DIAGNOSIS — M25542 Pain in joints of left hand: Secondary | ICD-10-CM

## 2019-05-02 DIAGNOSIS — M6281 Muscle weakness (generalized): Secondary | ICD-10-CM

## 2019-05-02 DIAGNOSIS — R600 Localized edema: Secondary | ICD-10-CM

## 2019-05-02 NOTE — Patient Instructions (Signed)
  1. Grip Strengthening (Resistive Putty)   Squeeze putty using thumb and all fingers. Repeat _20___ times. Do __2-3__ sessions per day.   2. Roll putty into tube on table and pinch between first two fingers and thumb x 10 reps. Do 2-3 sessions per day.     Copyright  VHI. All rights reserved.

## 2019-05-02 NOTE — Therapy (Signed)
[facility] [address] [place], [place], [address] Phone: [phone]   Fax:  [phone]  Occupational Therapy Treatment  Patient Details  Name: Erik Griffith MRN: [id number] Date of Birth: [birth year] Referring Provider (OT): Dr Janee Morn   Encounter Date: 05/02/2019  OT End of Session - 05/02/19 0916    Visit Number  7    Number of Visits  16    Date for OT Re-Evaluation  05/11/19    Authorization Type  [employer] -therapist recommended pt/ wife calls insurance company to veryify benefits per appt. notes on 04/12/19    OT Start Time  0847    OT Stop Time  0925    OT Time Calculation (min)  38 min    Activity Tolerance  Patient tolerated treatment well    Behavior During Therapy  [facility] for tasks assessed/performed       No past medical history on file.  Past Surgical History:  Procedure Laterality Date  . APPENDECTOMY    . FLEXOR TENDON REPAIR Left 03/07/2019   Procedure: LEFT THUMB FLEXOR TENDON REPAIR;  Surgeon: Mack Hook, MD;  Location: [facility];  Service: Orthopedics;  Laterality: Left;  . NERVE REPAIR Left 03/07/2019   Procedure: LEFT HAND NERVE REPAIR(S);  Surgeon: Mack Hook, MD;  Location: [facility];  Service: Orthopedics;  Laterality: Left;    There were no vitals filed for this visit.  Subjective Assessment - 05/02/19 0851    Subjective   I forgot my splint today    Pertinent History  FPL Repair and thenar muscle repair on 03/07/19. No significant PMH    Patient Stated Goals  Get left hand use back. Functional use.    Currently in Pain?  No/denies      Pulsed ultrasound x 8 min. Along incision area of Lt thumb at 3 Mhz, 20%, 0.8 wts/cm2 for scar tissue management.  Followed by passive and place and hold ex's in radial abduction, IP extension, and thumb flexion.  A/ROM in isolated IP flex/ext, opposition, thumb flexion, palmer abduction, and radial abduction with  tightness in radial abduction and thumb flexion.  Issued putty HEP today for grip and pinch strength and issued red putty - will progress to higher resistance putty after one week. Pt performed grip x 20 reps and pinch strength x 10 reps.                     OT Education - 05/02/19 0912    Education Details  Putty HEP    Person(s) Educated  Patient    Methods  Explanation;Demonstration;Verbal cues    Comprehension  Verbalized understanding;Returned demonstration       OT Short Term Goals - 04/25/19 1002      OT SHORT TERM GOAL #1   Title  Pt will be Mod I splinting use, care and precautions L thumb/forearm    Time  6    Period  Weeks    Status  Achieved    Target Date  04/27/19      OT SHORT TERM GOAL #2   Title  Pt will be Mod I HEP L UE    Time  6    Period  Weeks    Status  Achieved    Target Date  04/27/19      OT SHORT TERM GOAL #3   Title  Pt will be Mod I edema control L hand/UE    Time  6  Period  Weeks    Status  Achieved    Target Date  04/27/19        OT Long Term Goals - 03/16/19 1141      OT LONG TERM GOAL #1   Title  Pt will be Mod I updated HEP L thumb/UE    Time  12    Period  Weeks    Status  New    Target Date  06/08/19      OT LONG TERM GOAL #2   Title  Pt will be Mod I scar management L thumb, volar and dorsal wrist    Time  12    Period  Weeks    Status  New    Target Date  06/08/19      OT LONG TERM GOAL #3   Title  Pt will demonstrate left thumb A/ROM WFL's as seen by Mod I opposition and flexion to base of small finger followed by extension in prep for increased participation w/ ADL/IADL's    Time  12    Period  Weeks    Status  New    Target Date  06/08/19      OT LONG TERM GOAL #4   Title  Pt will report improved sensibility as seen by ability to detect semmes weinstein monofilaments of 3.61 or better left volar and dorsal thumb.    Time  12    Period  Weeks    Status  New    Target Date  06/08/19      OT  LONG TERM GOAL #5   Title  Pt will report pain L thumb as 3/10 or less during functional tasks during ADL/grasping tasks    Time  12    Period  Weeks    Status  New    Target Date  06/08/19            Plan - 05/02/19 0924    Clinical Impression Statement  Pt now 8 weeks post-op and began light strengthening today per protocol. Pt issued red putty    Occupational performance deficits (Please refer to evaluation for details):  ADL's;IADL's    Body Structure / Function / Physical Skills  ADL;UE functional use;Flexibility;Pain;FMC;ROM;GMC;Coordination;IADL;Dexterity;Strength;Scar mobility    Rehab Potential  Good    Comorbidities Affecting Occupational Performance:  None    OT Frequency  2x / week    OT Duration  12 weeks    OT Treatment/Interventions  Self-care/ADL training;Moist Heat;Fluidtherapy;Splinting;Therapeutic activities;Therapeutic exercise;Scar mobilization;Passive range of motion;Paraffin;Manual Therapy;Patient/family education    Plan  Pt to bring in pm splint for further adjustments into radial abduction, assess grip strength, continue pulsed [place]    Consulted and Agree with Plan of Care  Patient       Patient will benefit from skilled therapeutic intervention in order to improve the following deficits and impairments:   Body Structure / Function / Physical Skills: ADL, UE functional use, Flexibility, Pain, FMC, ROM, GMC, Coordination, IADL, Dexterity, Strength, Scar mobility       Visit Diagnosis: Pain in joint of left hand  Localized edema  Other lack of coordination  Muscle weakness (generalized)    Problem List Patient Active Problem List   Diagnosis Date Noted  . Neck pain 01/13/2019  . Neck strain, initial encounter 01/13/2019  . Back spasm 01/13/2019  . Lymphadenitis 01/13/2019    Carey Bullocks, OTR/L 05/02/2019, 9:26 AM  [facility] [address] [place], [place],  [address] Phone: [phone]   Fax:  [phone]  Name: Valeriano Bain MRN: [id number] Date of Birth: [birth year]

## 2019-05-04 ENCOUNTER — Ambulatory Visit: Payer: 59 | Admitting: Occupational Therapy

## 2019-05-09 ENCOUNTER — Ambulatory Visit: Payer: 59 | Admitting: Occupational Therapy

## 2019-05-17 ENCOUNTER — Other Ambulatory Visit: Payer: Self-pay

## 2019-05-17 ENCOUNTER — Ambulatory Visit: Payer: 59 | Attending: Orthopedic Surgery | Admitting: Occupational Therapy

## 2019-05-17 DIAGNOSIS — R600 Localized edema: Secondary | ICD-10-CM | POA: Insufficient documentation

## 2019-05-17 DIAGNOSIS — M6281 Muscle weakness (generalized): Secondary | ICD-10-CM | POA: Insufficient documentation

## 2019-05-17 NOTE — Therapy (Signed)
[facility] [address] [place], [place], [address] Phone: [phone]   Fax:  [phone]  Occupational Therapy Treatment  Patient Details  Name: Erik Griffith MRN: [id number] Date of Birth: [birth year] Referring Provider (OT): Dr Grandville Silos   Encounter Date: 05/17/2019  OT End of Session - 05/17/19 0842    Visit Number  8    Number of Visits  [place] -therapist recommended pt/ wife calls insurance company to veryify benefits per appt. notes on 04/12/19    OT Start Time  0802    OT Stop Time  0840    OT Time Calculation (min)  38 min    Activity Tolerance  Patient tolerated treatment well    Behavior During Therapy  [facility] for tasks assessed/performed       No past medical history on file.  Past Surgical History:  Procedure Laterality Date  . APPENDECTOMY    . FLEXOR TENDON REPAIR Left 03/07/2019   Procedure: LEFT THUMB FLEXOR TENDON REPAIR;  Surgeon: Milly Jakob, MD;  Location: [facility];  Service: Orthopedics;  Laterality: Left;  . NERVE REPAIR Left 03/07/2019   Procedure: LEFT HAND NERVE REPAIR(S);  Surgeon: Milly Jakob, MD;  Location: [facility];  Service: Orthopedics;  Laterality: Left;    There were no vitals filed for this visit.  Subjective Assessment - 05/17/19 0804    Subjective   We missed last week because we didn't want to see anyone but you    Patient is accompanied by:  Family member    Pertinent History  FPL Repair and thenar muscle repair on 03/07/19. No significant PMH    Currently in Pain?  No/denies        Pt missed therapy last week.  Therapist no longer feels pt needs pm splint. Pt has full thumb radial abduction but still tight with IP extension when thumb in radial abduction. Pt has full IP extension when in palmer abduction. Assessed goals and progress to date - see goal section.  Grip strength Lt = 71 lbs (Rt = 117 lbs). Issued  updated blue resistance putty and reviewed putty HEP. Discussed gradually increasing lifting and functional use of Lt hand, but recommended pt discuss with MD before returning to gym or work.  Ultrasound x 8 min, 20% pulsed, 0.8 wts/cm2, 3 Mhz over incision area prior to stretching/exercises.                      OT Short Term Goals - 04/25/19 1002      OT SHORT TERM GOAL #1   Title  Pt will be Mod I splinting use, care and precautions L thumb/forearm    Time  6    Period  Weeks    Status  Achieved    Target Date  04/27/19      OT SHORT TERM GOAL #2   Title  Pt will be Mod I HEP L UE    Time  6    Period  Weeks    Status  Achieved    Target Date  04/27/19      OT SHORT TERM GOAL #3   Title  Pt will be Mod I edema control L hand/UE    Time  6    Period  Weeks    Status  Achieved    Target Date  04/27/19        OT Long Term Goals -  05/17/19 0843      OT LONG TERM GOAL #1   Title  Pt will be Mod I updated HEP L thumb/UE    Time  12    Period  Weeks    Status  Achieved      OT LONG TERM GOAL #2   Title  Pt will be Mod I scar management L thumb, volar and dorsal wrist    Time  12    Period  Weeks    Status  Achieved      OT LONG TERM GOAL #3   Title  Pt will demonstrate left thumb A/ROM WFL's as seen by Mod I opposition and flexion to base of small finger followed by extension in prep for increased participation w/ ADL/IADL's    Time  12    Period  Weeks    Status  Partially Met   Pt can oppose to 5th digit easily but unable to get to base of 5th digit     OT LONG TERM GOAL #4   Title  Pt will report improved sensibility as seen by ability to detect semmes weinstein monofilaments of 3.61 or better left volar and dorsal thumb.    Time  12    Period  Weeks    Status  Not Met      OT LONG TERM GOAL #5   Title  Pt will report pain L thumb as 3/10 or less during functional tasks during ADL/grasping tasks    Time  12    Period  Weeks    Status   Achieved            Plan - 05/17/19 0844    Clinical Impression Statement  Pt has met most goals at this time. Pt lacks sensation Lt thumb and mild ROM impairments w/ full composite extension of thumb (at IP joint) and flexion to base of 5th digit however do not anticipate this hindering him functionally in any way. Grip strength also above functional    Occupational performance deficits (Please refer to evaluation for details):  ADL's;IADL's    Body Structure / Function / Physical Skills  ADL;UE functional use;Flexibility;Pain;FMC;ROM;GMC;Coordination;IADL;Dexterity;Strength;Scar mobility    Rehab Potential  Good    OT Frequency  2x / week    OT Duration  12 weeks    OT Treatment/Interventions  Self-care/ADL training;Moist Heat;Fluidtherapy;Splinting;Therapeutic activities;Therapeutic exercise;Scar mobilization;Passive range of motion;Paraffin;Manual Therapy;Patient/family education    Plan  Pt to be seen once more after MD visit - will reassess grip strength at that time and follow any other MD recommendations prn and anticipate d/c next session    Consulted and Agree with Plan of Care  Patient       Patient will benefit from skilled therapeutic intervention in order to improve the following deficits and impairments:   Body Structure / Function / Physical Skills: ADL, UE functional use, Flexibility, Pain, FMC, ROM, GMC, Coordination, IADL, Dexterity, Strength, Scar mobility       Visit Diagnosis: Muscle weakness (generalized)  Localized edema    Problem List Patient Active Problem List   Diagnosis Date Noted  . Neck pain 01/13/2019  . Neck strain, initial encounter 01/13/2019  . Back spasm 01/13/2019  . Lymphadenitis 01/13/2019    Carey Bullocks, OTR/L 05/17/2019, 10:04 AM  [facility] [address] [place], [place], [address] Phone: [phone]   Fax:  [phone]  Name: Erik Griffith MRN:  [id number] Date of Birth: 22-Nov-1985

## 2019-05-30 ENCOUNTER — Ambulatory Visit: Payer: 59 | Admitting: Occupational Therapy

## 2019-09-01 ENCOUNTER — Telehealth: Payer: Self-pay | Admitting: General Practice

## 2019-09-01 NOTE — Telephone Encounter (Signed)
Dismissal letter in guarantor snapshot  °

## 2021-02-06 ENCOUNTER — Emergency Department (HOSPITAL_BASED_OUTPATIENT_CLINIC_OR_DEPARTMENT_OTHER): Payer: Medicaid Other

## 2021-02-06 ENCOUNTER — Other Ambulatory Visit: Payer: Self-pay

## 2021-02-06 ENCOUNTER — Emergency Department (HOSPITAL_BASED_OUTPATIENT_CLINIC_OR_DEPARTMENT_OTHER): Payer: Medicaid Other | Admitting: Radiology

## 2021-02-06 ENCOUNTER — Inpatient Hospital Stay (HOSPITAL_BASED_OUTPATIENT_CLINIC_OR_DEPARTMENT_OTHER)
Admission: EM | Admit: 2021-02-06 | Discharge: 2021-02-17 | DRG: 674 | Disposition: A | Discharge status: Home / Self Care | Payer: Medicaid Other | Attending: Internal Medicine | Admitting: Internal Medicine

## 2021-02-06 ENCOUNTER — Encounter (HOSPITAL_BASED_OUTPATIENT_CLINIC_OR_DEPARTMENT_OTHER): Payer: Self-pay | Admitting: Emergency Medicine

## 2021-02-06 DIAGNOSIS — N189 Chronic kidney disease, unspecified: Secondary | ICD-10-CM

## 2021-02-06 DIAGNOSIS — I10 Essential (primary) hypertension: Secondary | ICD-10-CM

## 2021-02-06 DIAGNOSIS — N19 Unspecified kidney failure: Secondary | ICD-10-CM

## 2021-02-06 DIAGNOSIS — R0602 Shortness of breath: Secondary | ICD-10-CM | POA: Diagnosis present

## 2021-02-06 DIAGNOSIS — Z6832 Body mass index (BMI) 32.0-32.9, adult: Secondary | ICD-10-CM

## 2021-02-06 DIAGNOSIS — Z20822 Contact with and (suspected) exposure to covid-19: Secondary | ICD-10-CM | POA: Diagnosis present

## 2021-02-06 DIAGNOSIS — E872 Acidosis, unspecified: Secondary | ICD-10-CM | POA: Diagnosis present

## 2021-02-06 DIAGNOSIS — F17210 Nicotine dependence, cigarettes, uncomplicated: Secondary | ICD-10-CM | POA: Diagnosis present

## 2021-02-06 DIAGNOSIS — Z7151 Drug abuse counseling and surveillance of drug abuser: Secondary | ICD-10-CM

## 2021-02-06 DIAGNOSIS — E8770 Fluid overload, unspecified: Secondary | ICD-10-CM | POA: Diagnosis present

## 2021-02-06 DIAGNOSIS — E669 Obesity, unspecified: Secondary | ICD-10-CM | POA: Diagnosis present

## 2021-02-06 DIAGNOSIS — M6282 Rhabdomyolysis: Secondary | ICD-10-CM

## 2021-02-06 DIAGNOSIS — R202 Paresthesia of skin: Secondary | ICD-10-CM

## 2021-02-06 DIAGNOSIS — R531 Weakness: Secondary | ICD-10-CM

## 2021-02-06 DIAGNOSIS — F1490 Cocaine use, unspecified, uncomplicated: Secondary | ICD-10-CM | POA: Diagnosis present

## 2021-02-06 DIAGNOSIS — N170 Acute kidney failure with tubular necrosis: Principal | ICD-10-CM | POA: Diagnosis present

## 2021-02-06 DIAGNOSIS — N179 Acute kidney failure, unspecified: Secondary | ICD-10-CM

## 2021-02-06 DIAGNOSIS — R778 Other specified abnormalities of plasma proteins: Secondary | ICD-10-CM | POA: Diagnosis present

## 2021-02-06 DIAGNOSIS — R0981 Nasal congestion: Secondary | ICD-10-CM | POA: Diagnosis present

## 2021-02-06 DIAGNOSIS — D649 Anemia, unspecified: Secondary | ICD-10-CM | POA: Diagnosis present

## 2021-02-06 DIAGNOSIS — N250 Renal osteodystrophy: Secondary | ICD-10-CM | POA: Diagnosis present

## 2021-02-06 DIAGNOSIS — E871 Hypo-osmolality and hyponatremia: Secondary | ICD-10-CM | POA: Diagnosis present

## 2021-02-06 DIAGNOSIS — Z5329 Procedure and treatment not carried out because of patient's decision for other reasons: Secondary | ICD-10-CM | POA: Diagnosis not present

## 2021-02-06 LAB — CBC
HCT: 31.6 % — ABNORMAL LOW (ref 39.0–52.0)
Hemoglobin: 11.4 g/dL — ABNORMAL LOW (ref 13.0–17.0)
MCH: 29.5 pg (ref 26.0–34.0)
MCHC: 36.1 g/dL — ABNORMAL HIGH (ref 30.0–36.0)
MCV: 81.9 fL (ref 80.0–100.0)
Platelets: 397 10*3/uL (ref 150–400)
RBC: 3.86 MIL/uL — ABNORMAL LOW (ref 4.22–5.81)
RDW: 13.1 % (ref 11.5–15.5)
WBC: 10.3 10*3/uL (ref 4.0–10.5)
nRBC: 0 % (ref 0.0–0.2)

## 2021-02-06 LAB — DIFFERENTIAL
Abs Immature Granulocytes: 0.06 10*3/uL (ref 0.00–0.07)
Basophils Absolute: 0 10*3/uL (ref 0.0–0.1)
Basophils Relative: 0 %
Eosinophils Absolute: 0.1 10*3/uL (ref 0.0–0.5)
Eosinophils Relative: 1 %
Immature Granulocytes: 1 %
Lymphocytes Relative: 12 %
Lymphs Abs: 1.2 10*3/uL (ref 0.7–4.0)
Monocytes Absolute: 1 10*3/uL (ref 0.1–1.0)
Monocytes Relative: 10 %
Neutro Abs: 7.9 10*3/uL — ABNORMAL HIGH (ref 1.7–7.7)
Neutrophils Relative %: 76 %

## 2021-02-06 LAB — BASIC METABOLIC PANEL
Anion gap: 28 — ABNORMAL HIGH (ref 5–15)
BUN: 199 mg/dL — ABNORMAL HIGH (ref 6–20)
CO2: 10 mmol/L — ABNORMAL LOW (ref 22–32)
Calcium: 6.3 mg/dL — CL (ref 8.9–10.3)
Chloride: 89 mmol/L — ABNORMAL LOW (ref 98–111)
Creatinine, Ser: 23.58 mg/dL — ABNORMAL HIGH (ref 0.61–1.24)
GFR, Estimated: 2 mL/min — ABNORMAL LOW (ref >60)
Glucose, Bld: 132 mg/dL — ABNORMAL HIGH (ref 70–99)
Potassium: 4.2 mmol/L (ref 3.5–5.1)
Sodium: 127 mmol/L — ABNORMAL LOW (ref 135–145)

## 2021-02-06 LAB — RESP PANEL BY RT-PCR (FLU A&B, COVID) ARPGX2
Influenza A by PCR: NEGATIVE
Influenza B by PCR: NEGATIVE
SARS Coronavirus 2 by RT PCR: NEGATIVE

## 2021-02-06 LAB — TROPONIN I (HIGH SENSITIVITY)
Troponin I (High Sensitivity): 155 ng/L (ref <18)
Troponin I (High Sensitivity): 150 ng/L (ref <18)

## 2021-02-06 LAB — HEPATIC FUNCTION PANEL
ALT: 140 U/L — ABNORMAL HIGH (ref 0–44)
AST: 42 U/L — ABNORMAL HIGH (ref 15–41)
Albumin: 3.5 g/dL (ref 3.5–5.0)
Alkaline Phosphatase: 47 U/L (ref 38–126)
Bilirubin, Direct: <0.1 mg/dL (ref 0.0–0.2)
Total Bilirubin: 0.3 mg/dL (ref 0.3–1.2)
Total Protein: 6.4 g/dL — ABNORMAL LOW (ref 6.5–8.1)

## 2021-02-06 LAB — TSH: TSH: 1.784 u[IU]/mL (ref 0.350–4.500)

## 2021-02-06 LAB — MAGNESIUM: Magnesium: 2.2 mg/dL (ref 1.7–2.4)

## 2021-02-06 LAB — CK: Total CK: 12351 U/L — ABNORMAL HIGH (ref 49–397)

## 2021-02-06 IMAGING — US US RENAL
1 series · 14 of 25 positions shown · non-contrast
Comparison: None.

CLINICAL DATA: Renal failure

EXAM:
RENAL / URINARY TRACT ULTRASOUND COMPLETE

[Series 1: us renal · 36 acquisitions, 14 frames shown]
[im 1/36]
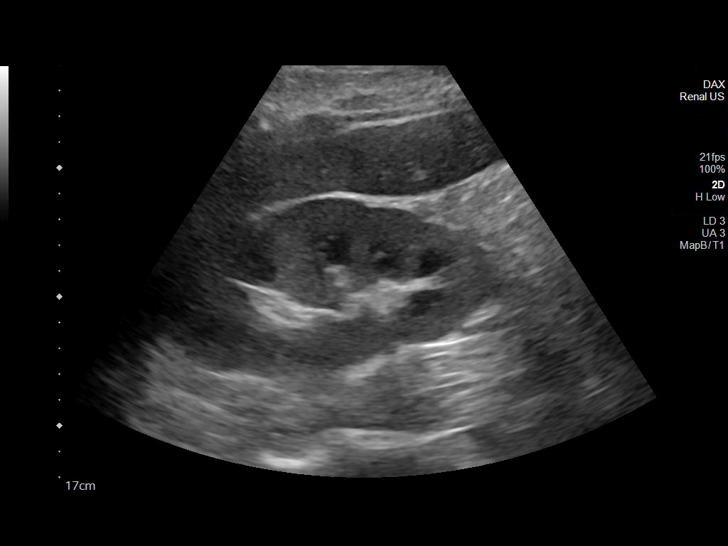
[im 3/36]
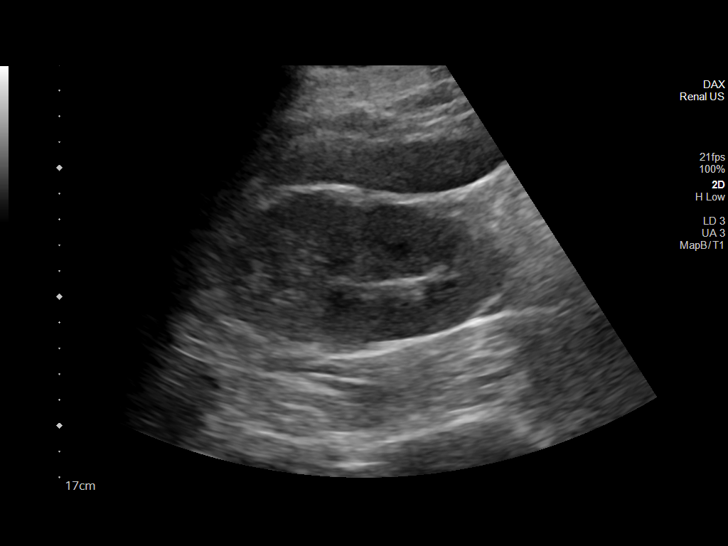
[im 6/36]
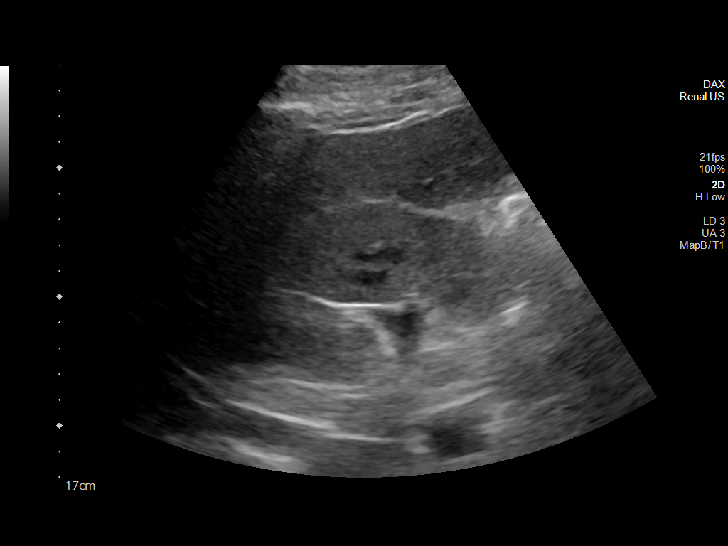
[im 9/36]
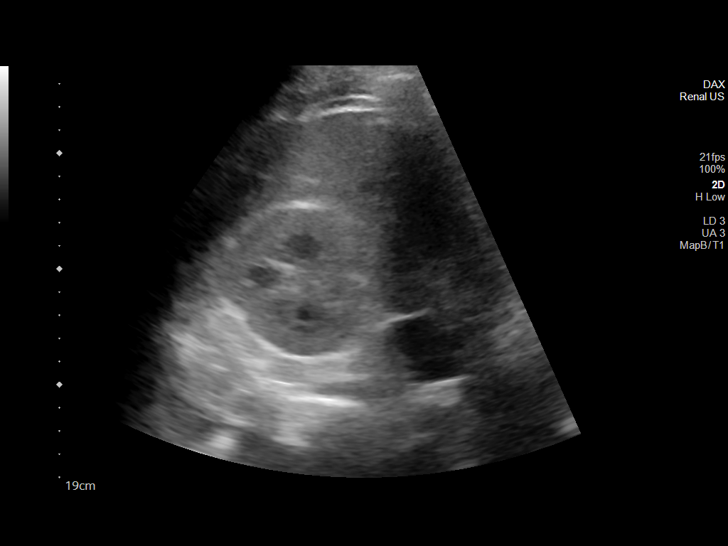
[im 12/36]
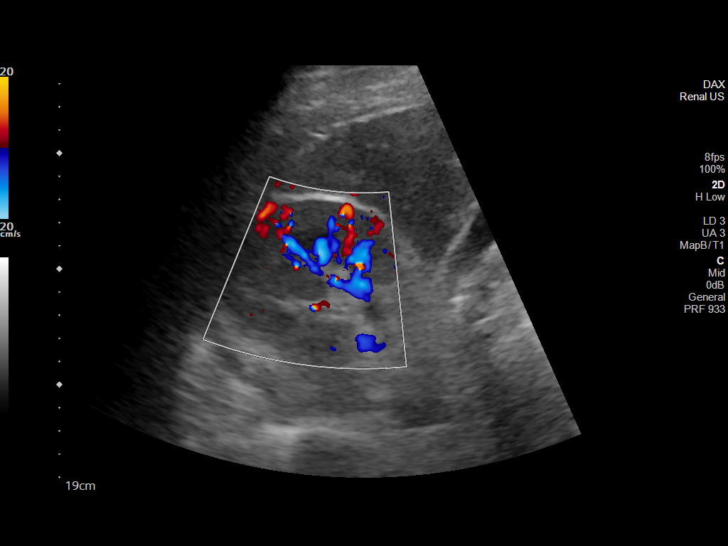
[im 14/36]
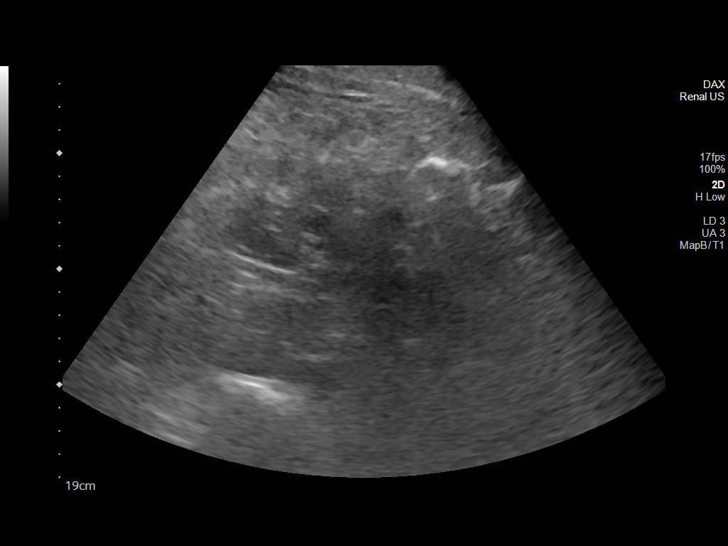
[im 17/36]
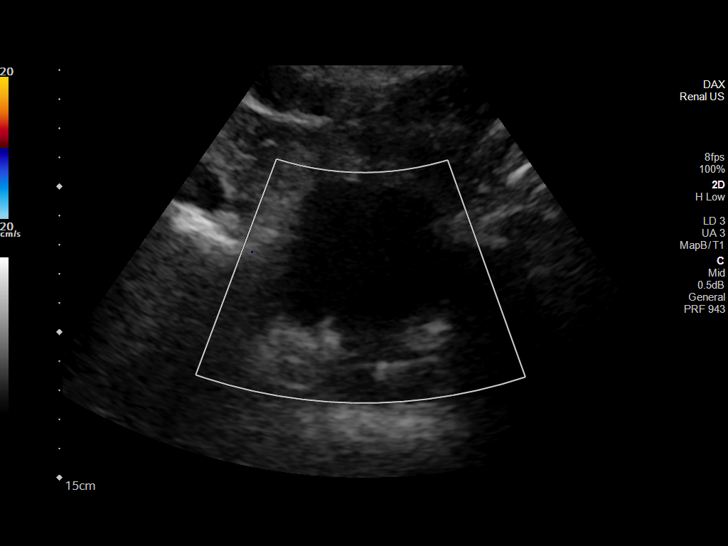
[im 19/36]
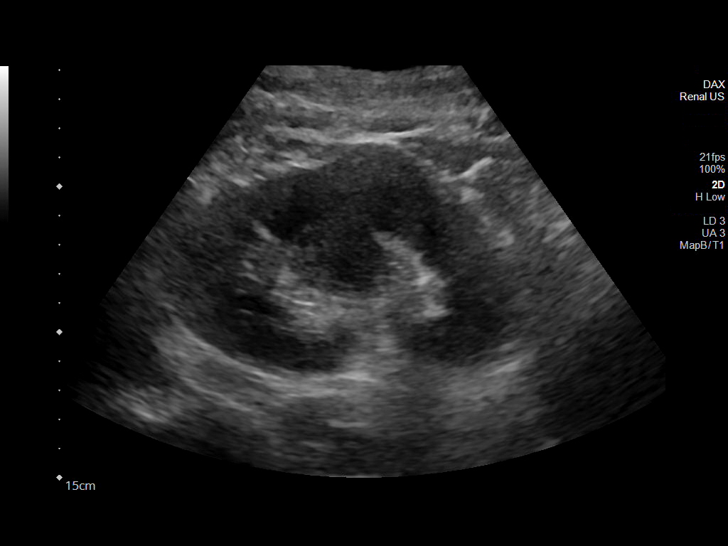
[im 22/36]
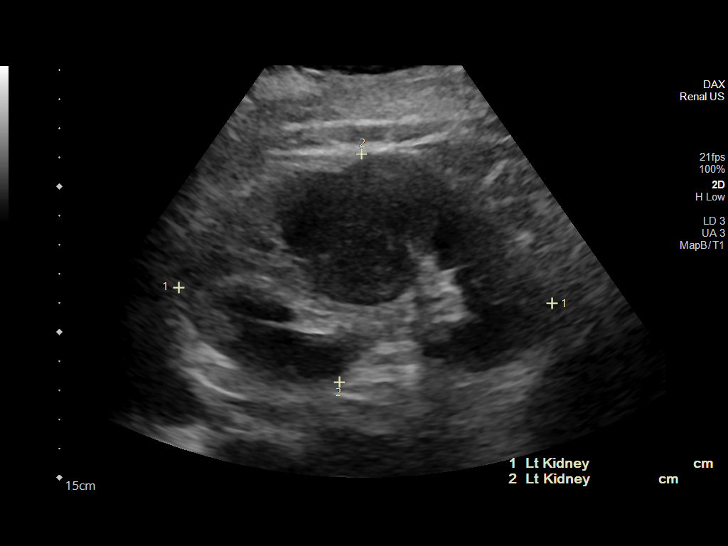
[im 24/36]
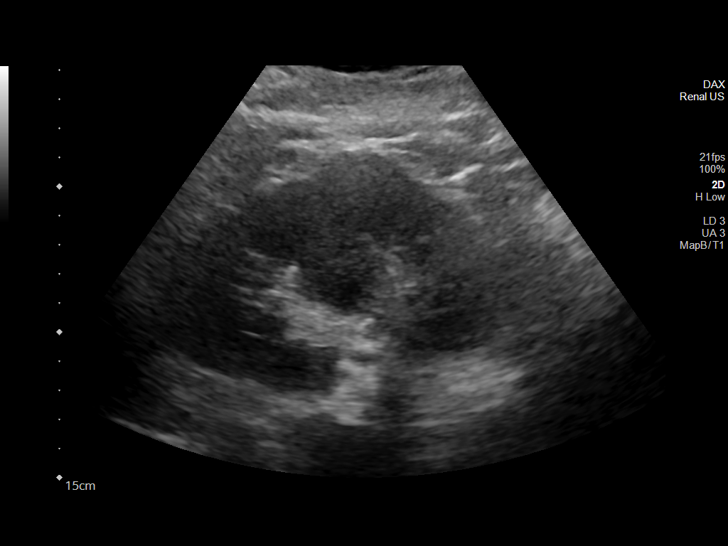
[im 27/36]
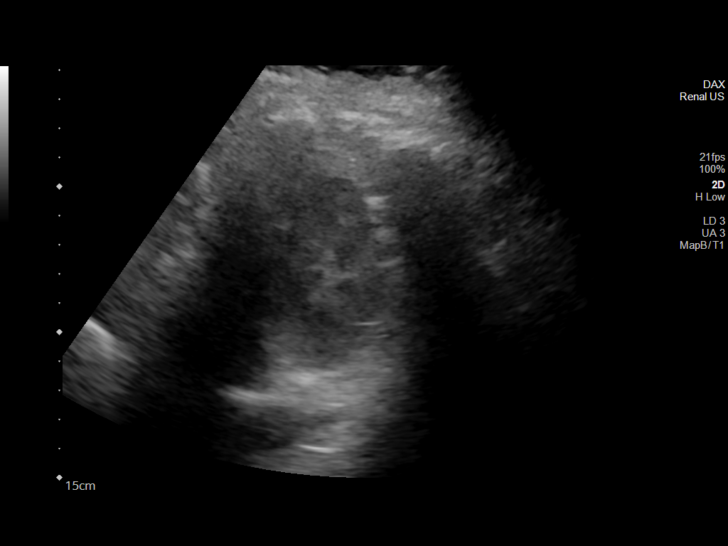
[im 30/36]
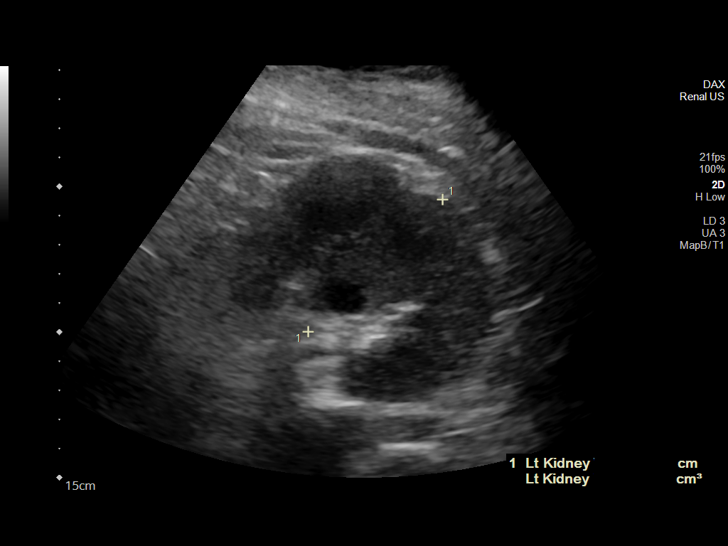
[im 33/36]
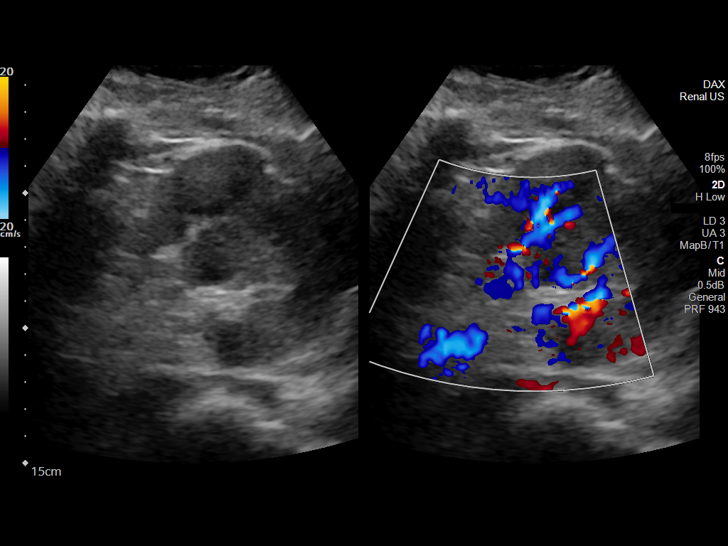
[im 36/36]
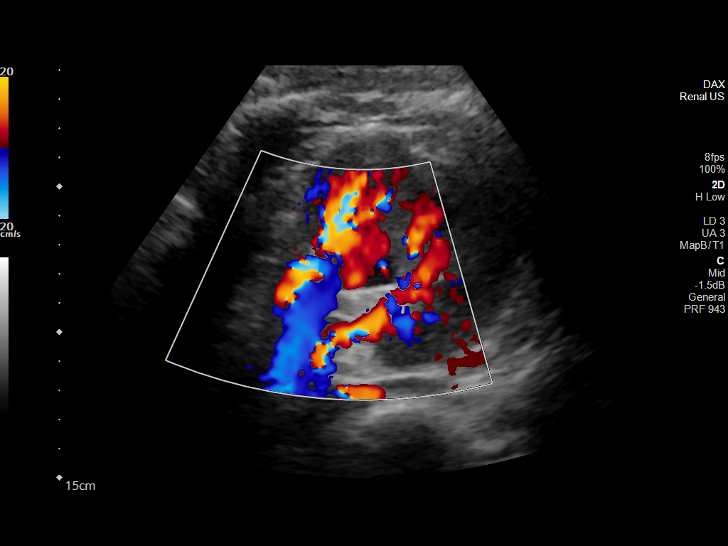

[14 of 25 positions shown; findings below may reference images not displayed]

FINDINGS: Right Kidney:

Renal measurements: 13.9 x 7 x 7.5 cm = volume: 380 mL. Cortex
appears slightly echogenic. No mass or hydronephrosis.

Left Kidney:

Renal measurements: 12.8 x 7.9 x 6.5 cm = volume: 343 mL. Cortex
appears slightly echogenic. No hydronephrosis. Suspect focally
prominent cortical tissue/possible column of Bertin at the mid
kidney.

Bladder:

Appears normal for degree of bladder distention.

Other:

Incidental right pleural effusion.
IMPRESSION: Kidneys prominent in size. Cortex slightly echogenic consistent with
medical renal disease. No hydronephrosis.

## 2021-02-06 IMAGING — DX DG CHEST 2V
2 series · 2 of 2 positions shown · non-contrast
Comparison: None.

CLINICAL DATA: Shortness of breath. Ten day history of nausea,
vomiting, diarrhea, chest pain, and shortness of breath.

EXAM:
CHEST - 2 VIEW

[chest lat]
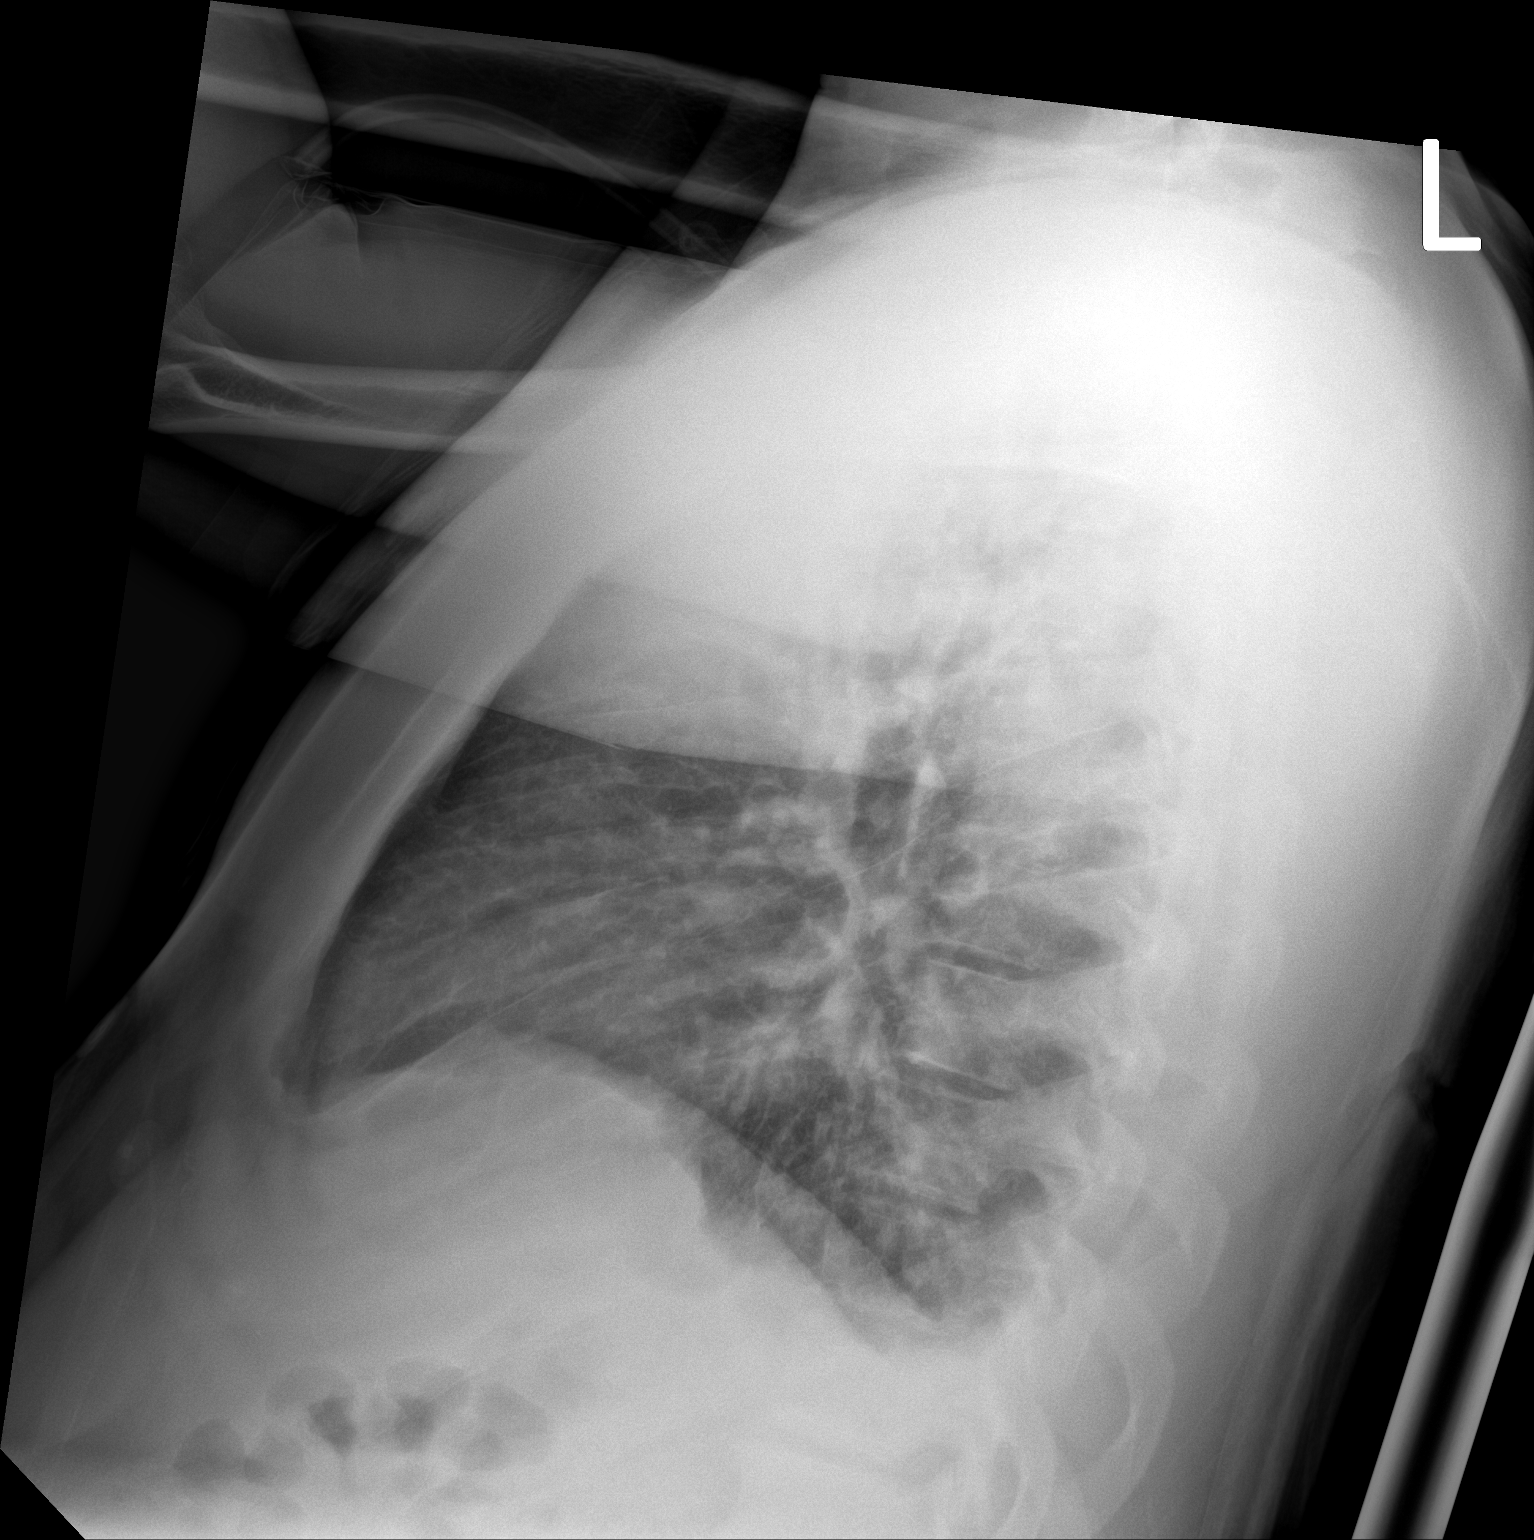

[chest ap]
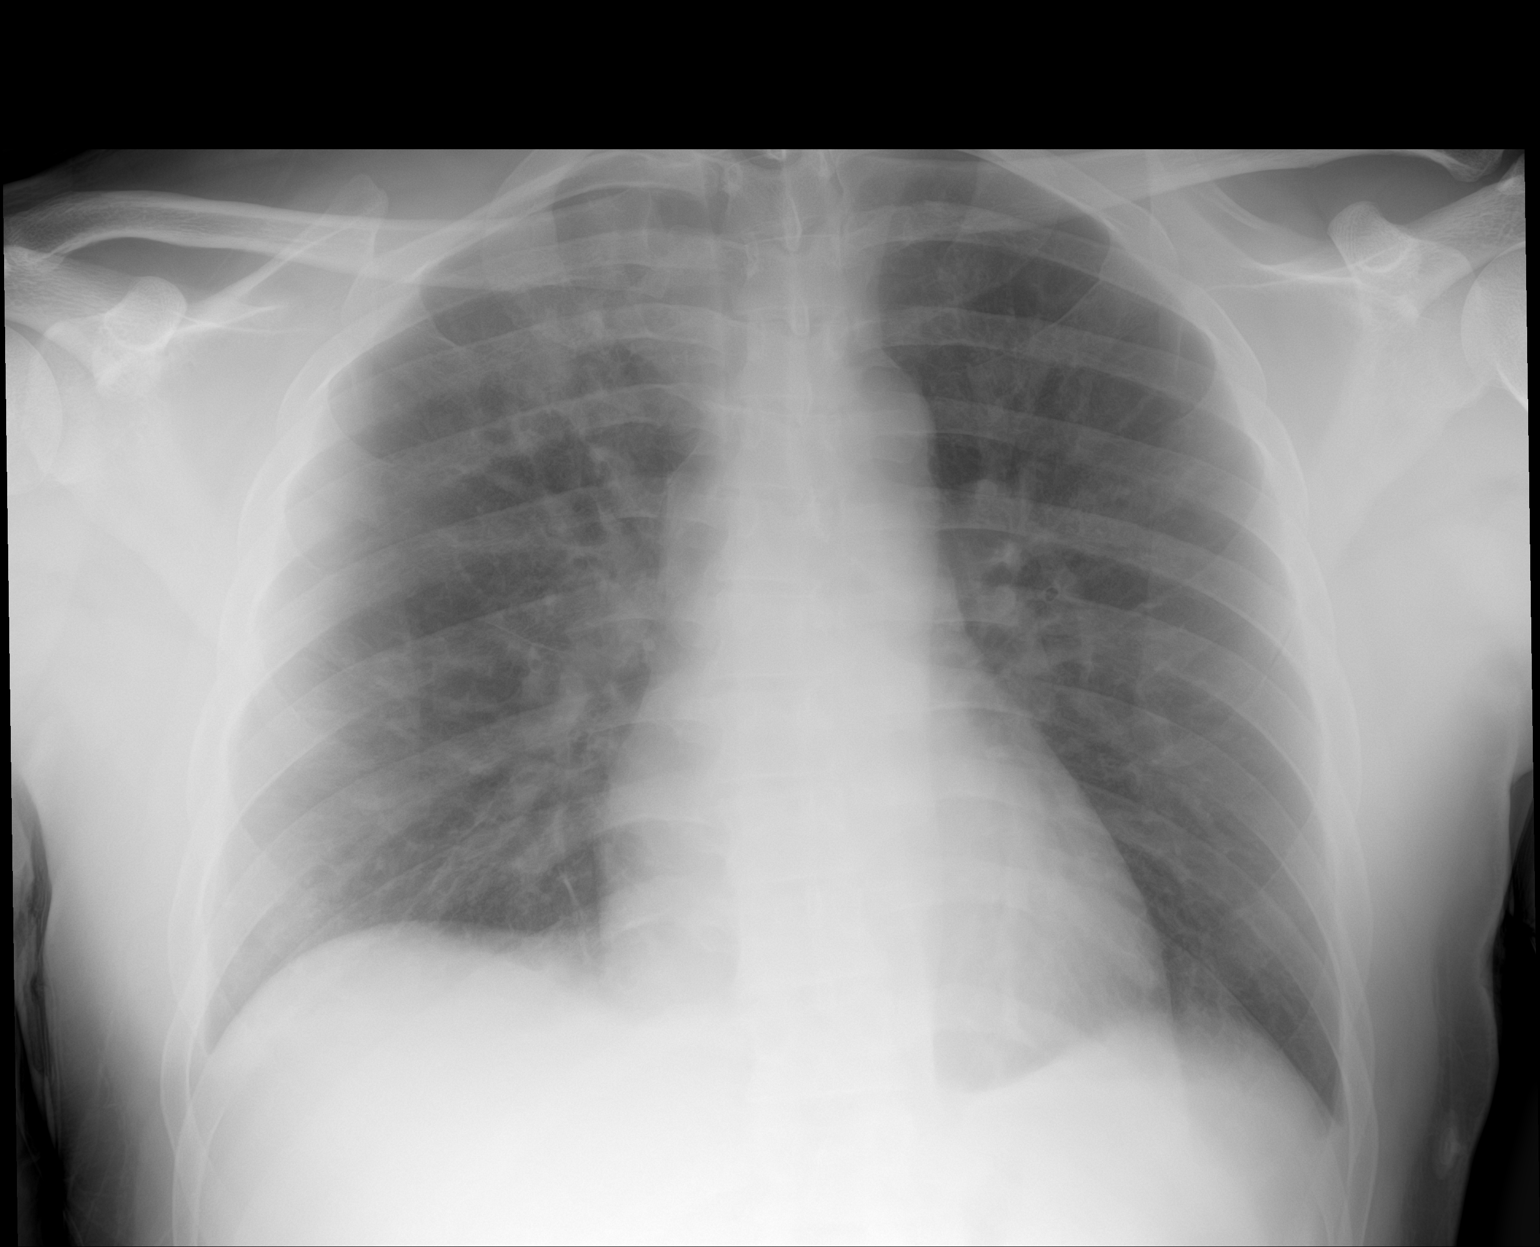

[2 of 2 positions shown; findings below may reference images not displayed]

FINDINGS: The heart size and mediastinal contours are within normal limits.
Both lungs are clear. The visualized skeletal structures are
unremarkable.
IMPRESSION: No active cardiopulmonary disease.

## 2021-02-06 MED ORDER — SODIUM BICARBONATE 8.4 % IV SOLN
INTRAVENOUS | Status: AC
  Administered 2021-02-06: 22:00:00 50 meq
  Filled 2021-02-06: qty 150

## 2021-02-06 MED ORDER — CALCIUM GLUCONATE-NACL 1-0.675 GM/50ML-% IV SOLN
1.0000 g | Freq: Once | INTRAVENOUS | Status: AC
  Administered 2021-02-07: 07:00:00 1000 mg via INTRAVENOUS
  Filled 2021-02-06: qty 50

## 2021-02-06 MED ORDER — STERILE WATER FOR INJECTION IV SOLN
INTRAVENOUS | Status: DC
  Filled 2021-02-06: qty 1000

## 2021-02-06 MED ORDER — SODIUM BICARBONATE 8.4 % IV SOLN
INTRAVENOUS | Status: DC
  Filled 2021-02-06 (×2): qty 1000

## 2021-02-06 MED ORDER — SODIUM CHLORIDE 0.9 % IV SOLN: 1.0000 g | Freq: Once | INTRAVENOUS | Status: DC

## 2021-02-06 MED ORDER — SODIUM CHLORIDE 0.9 % IV BOLUS
1000.0000 mL | Freq: Once | INTRAVENOUS | Status: AC
  Administered 2021-02-06: 20:00:00 1000 mL via INTRAVENOUS

## 2021-02-06 NOTE — ED Notes (Signed)
Called [facility] to transport patient to [facility] 5W room# 23 ?

## 2021-02-06 NOTE — Consult Note (Signed)
[name]  Requesting MD: Alvino Chapel  Indication for Consultation: AKI  HPI:  Erik Griffith is a 35 y.o. male with really no past medical history however remembers being told that he had high BP-  " a while back" .  last crt in the system was 1.1 almost 2 years ago now.  He presents to the ER last night with c/o weakness for the last 10 days.  Says initially felt tingly, then N/V/D with dark urine and then progressive weakness-  is not really able to do much of anything at this point-  also nosebleeds.  His BP is very high, 190 /100-  labs show crt of 23- BUN of 199, CK of 12K, troponin 150 and hgb of 11.4-  K is 4.2, sodium 127 and bicarb of 10, calcium of 6.3 with albumin of 3.5-  ER has called me for advice-  I suggested last night a bicarb based drip for the rhabdo but unclear what started this whole thing.  Renal u/s shows normal sized kidneys-  slightly echogenic cortex and no hydro.  He reports that he is making urine but none for review just yet. Interestingly his urine had 30 of protein and 11-20 RBC back in 2020.  I see him this AM-  he denies drug use, denies vigorous exercise or supplements, NSAIDS- nothing else unusual.  He is in distress right now-  anxious-  rapid breathing -  SO at bedside -  equally concerned  Creatinine, Ser  Date/Time Value Ref Range Status  02/06/2021 07:32 PM 23.58 (H) 0.61 - 1.24 mg/dL Final  02/27/2019 03:39 AM 1.11 0.61 - 1.24 mg/dL Final  04/11/2018 10:35 PM 0.92 0.61 - 1.24 mg/dL Final     PMHx:  History reviewed. No pertinent past medical history.  Past Surgical History:  Procedure Laterality Date   APPENDECTOMY     FLEXOR TENDON REPAIR Left 03/07/2019   Procedure: LEFT THUMB FLEXOR TENDON REPAIR;  Surgeon: Milly Jakob, MD;  Location: [place];  Service: Orthopedics;  Laterality: Left;   NERVE REPAIR Left 03/07/2019   Procedure: LEFT HAND NERVE REPAIR(S);  Surgeon: Milly Jakob, MD;   Location: [place];  Service: Orthopedics;  Laterality: Left;    Family Hx: History reviewed. No pertinent family history.  Social History:  reports that he has been smoking cigarettes. He has been smoking an average of .25 packs per day. He has never used smokeless tobacco. He reports current alcohol use. He reports current drug use. Drug: Marijuana.  Allergies: No Known Allergies  Medications: Prior to Admission medications   Medication Sig Start Date End Date Taking? Authorizing Provider  acetaminophen (TYLENOL) 325 MG tablet Take 2 tablets (650 mg total) by mouth every 6 (six) hours. Patient not taking: Reported on 05/02/2019 03/07/19   Milly Jakob, MD  ibuprofen (ADVIL) 600 MG tablet Take 1 tablet (600 mg total) by mouth every 6 (six) hours. Patient not taking: Reported on 05/02/2019 03/07/19   Milly Jakob, MD  oxyCODONE (OXY IR/ROXICODONE) 5 MG immediate release tablet Take 1 tablet (5 mg total) by mouth every 6 (six) hours as needed (severe breakthru postop pain). Patient not taking: Reported on 05/02/2019 03/07/19   Milly Jakob, MD    I have reviewed the patient's current medications.  Labs:  Results for orders placed or performed during the hospital encounter of 02/06/21 (from the past 48 hour(s))  Basic metabolic panel     Status: Abnormal   Collection  Time: 02/06/21  7:32 PM  Result Value Ref Range   Sodium 127 (L) 135 - 145 mmol/L   Potassium 4.2 3.5 - 5.1 mmol/L   Chloride 89 (L) 98 - 111 mmol/L   CO2 10 (L) 22 - 32 mmol/L   Glucose, Bld 132 (H) 70 - 99 mg/dL    Comment: Glucose reference range applies only to samples taken after fasting for at least 8 hours.   BUN 199 (H) 6 - 20 mg/dL    Comment: REPEATED TO VERIFY   Creatinine, Ser 23.58 (H) 0.61 - 1.24 mg/dL   Calcium 6.3 (LL) 8.9 - 10.3 mg/dL    Comment: CRITICAL RESULT CALLED TO, READ BACK BY AND VERIFIED WITH: POWELL, A, RN AT 2008 ON [id number] BY BOWLBY, J    GFR, Estimated 2 (L) >60  mL/min    Comment: (NOTE) Calculated using the CKD-EPI Creatinine Equation (2021)    Anion gap 28 (H) 5 - 15    Comment: Electrolytes repeated to confirm. Performed at [facility], [address], [place], [place] [address]   CBC     Status: Abnormal   Collection Time: 02/06/21  7:32 PM  Result Value Ref Range   WBC 10.3 4.0 - 10.5 K/uL   RBC 3.86 (L) 4.22 - 5.81 MIL/uL   Hemoglobin 11.4 (L) 13.0 - 17.0 g/dL   HCT 31.6 (L) 39.0 - 52.0 %   MCV 81.9 80.0 - 100.0 fL   MCH 29.5 26.0 - 34.0 pg   MCHC 36.1 (H) 30.0 - 36.0 g/dL   RDW 13.1 11.5 - 15.5 %   Platelets 397 150 - 400 K/uL   nRBC 0.0 0.0 - 0.2 %    Comment: Performed at [facility], [address], [place], [place] [address]  Troponin I (High Sensitivity)     Status: Abnormal   Collection Time: 02/06/21  7:32 PM  Result Value Ref Range   Troponin I (High Sensitivity) 155 (HH) <18 ng/L    Comment: CRITICAL RESULT CALLED TO, READ BACK BY AND VERIFIED WITH: A POWELL,RN 2035 02/06/21 DBRADLEY (NOTE) Elevated high sensitivity troponin I (hsTnI) values and significant  changes across serial measurements may suggest ACS but many other  chronic and acute conditions are known to elevate hsTnI results.  Refer to the Links section for chest pain algorithms and additional  guidance. Performed at [facility], [address] [place],  [address]   Resp Panel by RT-PCR (Flu A&B, Covid) Nasopharyngeal Swab     Status: None   Collection Time: 02/06/21  7:33 PM   Specimen: Nasopharyngeal Swab; Nasopharyngeal(NP) swabs in vial transport medium  Result Value Ref Range   SARS Coronavirus 2 by RT PCR NEGATIVE NEGATIVE    Comment: (NOTE) SARS-CoV-2 target nucleic acids are NOT DETECTED.  The SARS-CoV-2 RNA is generally detectable in upper respiratory specimens during the acute phase of infection. The lowest concentration of SARS-CoV-2 viral copies this assay can detect  is 138 copies/mL. A negative result does not preclude SARS-Cov-2 infection and should not be used as the sole basis for treatment or other patient management decisions. A negative result may occur with  improper specimen collection/handling, submission of specimen other than nasopharyngeal swab, presence of viral mutation(s) within the areas targeted by this assay, and inadequate number of viral copies(<138 copies/mL). A negative result must be combined with clinical observations, patient history, and epidemiological information. The expected result is Negative.  Fact Sheet for Patients:  EntrepreneurPulse.com.au  Fact  Sheet for Healthcare Providers:  IncredibleEmployment.be  This test is no t yet approved or cleared by the [place] FDA and  has been authorized for detection and/or diagnosis of SARS-CoV-2 by FDA under an Emergency Use Authorization (EUA). This EUA will remain  in effect (meaning this test can be used) for the duration of the COVID-19 declaration under Section 564(b)(1) of the Act, 21 U.S.C.section 360bbb-3(b)(1), unless the authorization is terminated  or revoked sooner.       Influenza A by PCR NEGATIVE NEGATIVE   Influenza B by PCR NEGATIVE NEGATIVE    Comment: (NOTE) The Xpert Xpress SARS-CoV-2/FLU/RSV plus assay is intended as an aid in the diagnosis of influenza from Nasopharyngeal swab specimens and should not be used as a sole basis for treatment. Nasal washings and aspirates are unacceptable for Xpert Xpress SARS-CoV-2/FLU/RSV testing.  Fact Sheet for Patients: EntrepreneurPulse.com.au  Fact Sheet for Healthcare Providers: IncredibleEmployment.be  This test is not yet approved or cleared by the [place] FDA and has been authorized for detection and/or diagnosis of SARS-CoV-2 by FDA under an Emergency Use Authorization (EUA). This EUA will remain in effect (meaning this  test can be used) for the duration of the COVID-19 declaration under Section 564(b)(1) of the Act, 21 U.S.C. section 360bbb-3(b)(1), unless the authorization is terminated or revoked.  Performed at [facility], [address], [place], [place] [address]   TSH     Status: None   Collection Time: 02/06/21  8:05 PM  Result Value Ref Range   TSH 1.784 0.350 - 4.500 uIU/mL    Comment: Performed by a 3rd Generation assay with a functional sensitivity of <=0.01 uIU/mL. Performed at [facility], [place] [address] [place], [place] [address]   CK     Status: Abnormal   Collection Time: 02/06/21  8:05 PM  Result Value Ref Range   Total CK 12,351 (H) 49 - 397 U/L    Comment: Performed at [facility], [address] [place], [place] [address]  Hepatic function panel     Status: Abnormal   Collection Time: 02/06/21  8:05 PM  Result Value Ref Range   Total Protein 6.4 (L) 6.5 - 8.1 g/dL   Albumin 3.5 3.5 - 5.0 g/dL   AST 42 (H) 15 - 41 U/L   ALT 140 (H) 0 - 44 U/L   Alkaline Phosphatase 47 38 - 126 U/L   Total Bilirubin 0.3 0.3 - 1.2 mg/dL   Bilirubin, Direct <0.1 0.0 - 0.2 mg/dL   Indirect Bilirubin NOT CALCULATED 0.3 - 0.9 mg/dL    Comment: Performed at [facility], [address] [place], [place] [address]  Magnesium     Status: None   Collection Time: 02/06/21  8:05 PM  Result Value Ref Range   Magnesium 2.2 1.7 - 2.4 mg/dL    Comment: Performed at [facility], [address] [place], [place] [address]  Differential     Status: Abnormal   Collection Time: 02/06/21  8:05 PM  Result Value Ref Range   Neutrophils Relative % 76 %   Neutro Abs 7.9 (H) 1.7 - 7.7 K/uL   Lymphocytes Relative 12 %   Lymphs Abs 1.2 0.7 - 4.0 K/uL   Monocytes Relative 10 %   Monocytes Absolute 1.0 0.1 - 1.0 K/uL   Eosinophils Relative 1 %   Eosinophils Absolute 0.1 0.0 - 0.5 K/uL   Basophils Relative 0 %   Basophils  Absolute 0.0  0.0 - 0.1 K/uL   Immature Granulocytes 1 %   Abs Immature Granulocytes 0.06 0.00 - 0.07 K/uL    Comment: Performed at [facility], [address], [place], [place] [address]  Troponin I (High Sensitivity)     Status: Abnormal   Collection Time: 02/06/21  9:17 PM  Result Value Ref Range   Troponin I (High Sensitivity) 150 (HH) <18 ng/L    Comment: CRITICAL VALUE NOTED.  VALUE IS CONSISTENT WITH PREVIOUSLY REPORTED AND CALLED VALUE. (NOTE) Elevated high sensitivity troponin I (hsTnI) values and significant  changes across serial measurements may suggest ACS but many other  chronic and acute conditions are known to elevate hsTnI results.  Refer to the Links section for chest pain algorithms and additional  guidance. Performed at [facility], [address], [place], [place] [address]      ROS:  A comprehensive review of systems was negative except for: Constitutional: positive for fatigue and malaise Gastrointestinal: positive for nausea and vomiting Genitourinary: positive for initially dark urine   LE edema   Physical Exam: Vitals:   02/06/21 2245 02/06/21 2300  BP: (!) 174/102 (!) 168/135  Pulse: 99 (!) 103  Resp: (!) 24 18  Temp:    SpO2: 99% 97%     General: fatigued BM lying in bed-  very weak-  rapid RR HEENT: PERRLA, EOMI, mucous membranes moist  Neck: positive for JVD Heart: tachy Lungs:  mostly clear Abdomen: non distended-  slightly tender diffusely to palpation Extremities:  mild pitting edema -  tender to palpation Skin: warm and dry Neuro: alert, anxious   Assessment/Plan: 35 year old male with presumed AKI 1.Renal- presumed AKI but do not have renal function data for the last 2 years-  the fact that his urine showed some blood and protein 2 years ago and that he has had high BP is concerning for possibly something underlying.  No crush injury or heavy exertion-  almost seems like rhabdo was after the  fact once he was uremic and not able to do anything-  something like a myositis is also on the differential.  High BP is maybe not what I would expect from rhabdo-  neuro has been consulted to look for a myositis type picture. He is volume overloaded so will stop the IVF.  I am concerned about a primary GN -  if that were the case I would be concerned about the prospect of recovery, big kidneys is possibly encouraging-  will send off serologies and consider renal biopsy if no improvement with fluids.  He is feeling so terrible I think we need to do dialysis sooner rather than later to get him better while we investigate the cause.  If considering biopsy will need to have better BUN to limit complication of bleeding.  Lots of things on the differential at this point  2. Hypertension/volume  - hypertensive with possible history of such-  as above concern for primary GN-  PRN meds per primary-  obviously to avoid ace/arb 3.  Elytes-  hypocalcemia that appears to be symptomatic-  getting repletion and follow  4. Anemia  - is anemic, indicative of something chronic although having nosebleeds as well-  when giving volume hgb will go down further -  no low platelets-  may want to look at smear and check LDH to look for hemolytic anemia ? Wondering about TMA or aHUS ?     Cecille Aver 02/06/2021, 11:17 PM

## 2021-02-06 NOTE — ED Provider Notes (Signed)
[facility] EMERGENCY DEPT Provider Note   CSN: [id number] Arrival date & time: 02/06/21  1903     History Chief Complaint  Patient presents with   Chest Pain   Nausea    Erik Griffith is a 35 y.o. male.   Chest Pain Associated symptoms: abdominal pain, fatigue and weakness   Associated symptoms: no back pain, no numbness and no shortness of breath   Patient presents with chest pain generalized weakness.  Has been going for around the last week and a half.  Started out as feeling a little tingly all over.  Few days later developed some nausea vomiting diarrhea.  States at the beginning he had some almost black urine.  States it is since cleared up and is normal.  She has had decreased oral intake.  States however he is feeling more weak.  More fatigued.  States he can hardly get up and move around.  States he feels weak particularly in his legs but it is more of an effort.  States he just feels worn out after any activity.  States he has to sit up from laying down and wait a few minutes before he has the strength to stand up.  Came in with crutches and help from family members.  Had to take a wheelchair to wheeled to the back.  No fevers. May patient also is reportedly been having nosebleeds.  States that has been going over the last few days also.  No other bleeding.    History reviewed. No pertinent past medical history.  Patient Active Problem List   Diagnosis Date Noted   Neck pain 01/13/2019   Neck strain, initial encounter 01/13/2019   Back spasm 01/13/2019   Lymphadenitis 01/13/2019    Past Surgical History:  Procedure Laterality Date   APPENDECTOMY     FLEXOR TENDON REPAIR Left 03/07/2019   Procedure: LEFT THUMB FLEXOR TENDON REPAIR;  Surgeon: Mack Hook, MD;  Location: [facility];  Service: Orthopedics;  Laterality: Left;   NERVE REPAIR Left 03/07/2019   Procedure: LEFT HAND NERVE REPAIR(S);  Surgeon: Mack Hook, MD;  Location:  [facility];  Service: Orthopedics;  Laterality: Left;       History reviewed. No pertinent family history.  Social History   Tobacco Use   Smoking status: Every Day    Packs/day: 0.25    Types: Cigarettes   Smokeless tobacco: Never  Substance Use Topics   Alcohol use: Yes    Comment: occ   Drug use: Yes    Types: Marijuana    Home Medications Prior to Admission medications   Medication Sig Start Date End Date Taking? Authorizing Provider  acetaminophen (TYLENOL) 325 MG tablet Take 2 tablets (650 mg total) by mouth every 6 (six) hours. Patient not taking: Reported on 05/02/2019 03/07/19   Mack Hook, MD  ibuprofen (ADVIL) 600 MG tablet Take 1 tablet (600 mg total) by mouth every 6 (six) hours. Patient not taking: Reported on 05/02/2019 03/07/19   Mack Hook, MD  oxyCODONE (OXY IR/ROXICODONE) 5 MG immediate release tablet Take 1 tablet (5 mg total) by mouth every 6 (six) hours as needed (severe breakthru postop pain). Patient not taking: Reported on 05/02/2019 03/07/19   Mack Hook, MD    Allergies    Patient has no known allergies.  Review of Systems   Review of Systems  Constitutional:  Positive for appetite change, chills and fatigue.  HENT:  Negative for congestion.   Respiratory:  Negative  for shortness of breath.   Cardiovascular:  Positive for chest pain.  Gastrointestinal:  Positive for abdominal pain.  Genitourinary:  Negative for flank pain.  Musculoskeletal:  Negative for back pain.  Neurological:  Positive for weakness. Negative for numbness.   Physical Exam Updated Vital Signs BP (!) 189/112    Pulse 98    Temp 99.1 F (37.3 C)    Resp (!) 21    Ht 5\' 11"  (1.803 m)    Wt 101.6 kg    SpO2 99%    BMI 31.24 kg/m   Physical Exam Vitals and nursing note reviewed.  HENT:     Head: Atraumatic.  Cardiovascular:     Rate and Rhythm: Normal rate and regular rhythm.  Pulmonary:     Breath sounds: No wheezing or rhonchi.  Chest:      Chest wall: No tenderness.  Abdominal:     Comments: Some left-sided tenderness.  No rebound or guarding.  Musculoskeletal:     Right lower leg: No edema.     Left lower leg: No edema.  Skin:    General: Skin is warm.     Capillary Refill: Capillary refill takes less than 2 seconds.  Neurological:     Mental Status: He is alert.    ED Results / Procedures / Treatments   Labs (all labs ordered are listed, but only abnormal results are displayed) Labs Reviewed  BASIC METABOLIC PANEL - Abnormal; Notable for the following components:      Result Value   Sodium 127 (*)    Chloride 89 (*)    CO2 10 (*)    Glucose, Bld 132 (*)    BUN 199 (*)    Creatinine, Ser 23.58 (*)    Calcium 6.3 (*)    GFR, Estimated 2 (*)    Anion gap 28 (*)    All other components within normal limits  CBC - Abnormal; Notable for the following components:   RBC 3.86 (*)    Hemoglobin 11.4 (*)    HCT 31.6 (*)    MCHC 36.1 (*)    All other components within normal limits  CK - Abnormal; Notable for the following components:   Total CK 12,351 (*)    All other components within normal limits  HEPATIC FUNCTION PANEL - Abnormal; Notable for the following components:   Total Protein 6.4 (*)    AST 42 (*)    ALT 140 (*)    All other components within normal limits  DIFFERENTIAL - Abnormal; Notable for the following components:   Neutro Abs 7.9 (*)    All other components within normal limits  TROPONIN I (HIGH SENSITIVITY) - Abnormal; Notable for the following components:   Troponin I (High Sensitivity) 155 (*)    All other components within normal limits  RESP PANEL BY RT-PCR (FLU A&B, COVID) ARPGX2  MAGNESIUM  TSH  URINALYSIS, ROUTINE W REFLEX MICROSCOPIC  TROPONIN I (HIGH SENSITIVITY)    EKG EKG Interpretation  Date/Time:  Wednesday February 06 2021 19:12:41 EST Ventricular Rate:  91 PR Interval:  142 QRS Duration: 80 QT Interval:  374 QTC Calculation: 460 R Axis:   94 Text  Interpretation: Normal sinus rhythm Rightward axis Borderline ECG No previous ECGs available Confirmed by [id number] [phone]) on 02/06/2021 8:06:39 PM  Radiology DG Chest 2 View  Result Date: 02/06/2021 CLINICAL DATA:  Shortness of breath. Ten day history of nausea, vomiting, diarrhea, chest pain, and shortness of breath. EXAM: CHEST -  2 VIEW COMPARISON:  None. FINDINGS: The heart size and mediastinal contours are within normal limits. Both lungs are clear. The visualized skeletal structures are unremarkable. IMPRESSION: No active cardiopulmonary disease. Electronically Signed   By: Burman Nieves M.D.   On: 02/06/2021 20:02    Procedures Procedures   Medications Ordered in ED Medications  sodium bicarbonate 150 mEq in sterile water 1,150 mL infusion (has no administration in time range)  sodium chloride 0.9 % bolus 1,000 mL (1,000 mLs Intravenous New Bag/Given 02/06/21 2020)    ED Course  I have reviewed the triage vital signs and the nursing notes.  Pertinent labs & imaging results that were available during my care of the patient were reviewed by me and considered in my medical decision making (see chart for details).    MDM Rules/Calculators/A&P                         Patient presents with generalized weakness.  Nausea vomiting diarrhea few days ago.  Now worsening weakness and inability to walk due to generalized weakness.  Found to be hypotensive.  Also has had some nosebleeds.  Hemoglobin reassuring but unfortunate appears to be in a renal failure.  Creatinine up to 23.  BUN of 200.  Bicarb of 10.  Also hypocalcemia with a calcium level of 6.3.  Troponin mildly elevated.  Likely due to renal failure.  Acute cardiac ischemia felt less likely.  EKG reassuring.  Although does have a hypertension.  Discussed with Dr. Kathrene Bongo.  Has started bicarb drip.  Total CK is 25,000.  Did have some dark urine at the beginning of this around 10 days ago but that is cleared up.  He is  currently at renal ultrasound.  She will round on him tomorrow but in the reached further input is needed.  Will discuss with hospitalist for admission.   CRITICAL CARE Performed by: Benjiman Core Total critical care time: 30 minutes Critical care time was exclusive of separately billable procedures and treating other patients. Critical care was necessary to treat or prevent imminent or life-threatening deterioration. Critical care was time spent personally by me on the following activities: development of treatment plan with patient and/or surrogate as well as nursing, discussions with consultants, evaluation of patient's response to treatment, examination of patient, obtaining history from patient or surrogate, ordering and performing treatments and interventions, ordering and review of laboratory studies, ordering and review of radiographic studies, pulse oximetry and re-evaluation of patient's condition.     Final Clinical Impression(s) / ED Diagnoses Final diagnoses:  Acute renal failure, unspecified acute renal failure type (HCC)  Hypocalcemia  Hypertension, unspecified type  Non-traumatic rhabdomyolysis    Rx / DC Orders ED Discharge Orders     None        Benjiman Core, MD 02/06/21 2130

## 2021-02-06 NOTE — ED Triage Notes (Signed)
°  Patient comes in with 10 day history of N/V/D and chest pain/SOB.  Wife states patient has been taking tylenol for pain, drinking powerade/gingerale, and using primatene inhaler.  Patient states legs and arms have felt numb at times, mostly legs the last few days.  Pain 9/10, numbness in legs.

## 2021-02-06 NOTE — ED Notes (Signed)
Attempted to give report 

## 2021-02-07 ENCOUNTER — Inpatient Hospital Stay (HOSPITAL_COMMUNITY): Payer: Medicaid Other

## 2021-02-07 ENCOUNTER — Encounter (HOSPITAL_COMMUNITY): Payer: Self-pay | Admitting: Internal Medicine

## 2021-02-07 DIAGNOSIS — Z20822 Contact with and (suspected) exposure to covid-19: Secondary | ICD-10-CM | POA: Diagnosis present

## 2021-02-07 DIAGNOSIS — R0602 Shortness of breath: Secondary | ICD-10-CM | POA: Diagnosis present

## 2021-02-07 DIAGNOSIS — E669 Obesity, unspecified: Secondary | ICD-10-CM | POA: Diagnosis present

## 2021-02-07 DIAGNOSIS — R0981 Nasal congestion: Secondary | ICD-10-CM | POA: Diagnosis present

## 2021-02-07 DIAGNOSIS — M609 Myositis, unspecified: Secondary | ICD-10-CM

## 2021-02-07 DIAGNOSIS — N179 Acute kidney failure, unspecified: Secondary | ICD-10-CM | POA: Insufficient documentation

## 2021-02-07 DIAGNOSIS — M79605 Pain in left leg: Secondary | ICD-10-CM

## 2021-02-07 DIAGNOSIS — E871 Hypo-osmolality and hyponatremia: Secondary | ICD-10-CM | POA: Diagnosis present

## 2021-02-07 DIAGNOSIS — E8770 Fluid overload, unspecified: Secondary | ICD-10-CM | POA: Diagnosis present

## 2021-02-07 DIAGNOSIS — Z7151 Drug abuse counseling and surveillance of drug abuser: Secondary | ICD-10-CM | POA: Diagnosis not present

## 2021-02-07 DIAGNOSIS — D649 Anemia, unspecified: Secondary | ICD-10-CM | POA: Diagnosis present

## 2021-02-07 DIAGNOSIS — I10 Essential (primary) hypertension: Secondary | ICD-10-CM

## 2021-02-07 DIAGNOSIS — M6282 Rhabdomyolysis: Secondary | ICD-10-CM | POA: Diagnosis present

## 2021-02-07 DIAGNOSIS — R531 Weakness: Secondary | ICD-10-CM

## 2021-02-07 DIAGNOSIS — R778 Other specified abnormalities of plasma proteins: Secondary | ICD-10-CM | POA: Diagnosis present

## 2021-02-07 DIAGNOSIS — F17210 Nicotine dependence, cigarettes, uncomplicated: Secondary | ICD-10-CM | POA: Diagnosis present

## 2021-02-07 DIAGNOSIS — F1490 Cocaine use, unspecified, uncomplicated: Secondary | ICD-10-CM | POA: Diagnosis present

## 2021-02-07 DIAGNOSIS — Z5329 Procedure and treatment not carried out because of patient's decision for other reasons: Secondary | ICD-10-CM | POA: Diagnosis not present

## 2021-02-07 DIAGNOSIS — E872 Acidosis, unspecified: Secondary | ICD-10-CM | POA: Diagnosis present

## 2021-02-07 DIAGNOSIS — N250 Renal osteodystrophy: Secondary | ICD-10-CM | POA: Diagnosis present

## 2021-02-07 DIAGNOSIS — M79604 Pain in right leg: Secondary | ICD-10-CM

## 2021-02-07 DIAGNOSIS — N170 Acute kidney failure with tubular necrosis: Secondary | ICD-10-CM | POA: Diagnosis present

## 2021-02-07 DIAGNOSIS — R202 Paresthesia of skin: Secondary | ICD-10-CM

## 2021-02-07 DIAGNOSIS — Z6832 Body mass index (BMI) 32.0-32.9, adult: Secondary | ICD-10-CM | POA: Diagnosis not present

## 2021-02-07 HISTORY — PX: IR US GUIDE VASC ACCESS RIGHT: IMG2390

## 2021-02-07 HISTORY — PX: IR FLUORO GUIDE CV LINE RIGHT: IMG2283

## 2021-02-07 LAB — COMPREHENSIVE METABOLIC PANEL
ALT: 113 U/L — ABNORMAL HIGH (ref 0–44)
AST: 35 U/L (ref 15–41)
Albumin: 2.7 g/dL — ABNORMAL LOW (ref 3.5–5.0)
Alkaline Phosphatase: 48 U/L (ref 38–126)
Anion gap: 25 — ABNORMAL HIGH (ref 5–15)
BUN: 209 mg/dL — ABNORMAL HIGH (ref 6–20)
CO2: 11 mmol/L — ABNORMAL LOW (ref 22–32)
Calcium: 5.8 mg/dL — CL (ref 8.9–10.3)
Chloride: 91 mmol/L — ABNORMAL LOW (ref 98–111)
Creatinine, Ser: 23.31 mg/dL — ABNORMAL HIGH (ref 0.61–1.24)
GFR, Estimated: 2 mL/min — ABNORMAL LOW (ref >60)
Glucose, Bld: 105 mg/dL — ABNORMAL HIGH (ref 70–99)
Potassium: 3.7 mmol/L (ref 3.5–5.1)
Sodium: 127 mmol/L — ABNORMAL LOW (ref 135–145)
Total Bilirubin: 0.6 mg/dL (ref 0.3–1.2)
Total Protein: 5.5 g/dL — ABNORMAL LOW (ref 6.5–8.1)

## 2021-02-07 LAB — CK: Total CK: 9271 U/L — ABNORMAL HIGH (ref 49–397)

## 2021-02-07 LAB — CBC
HCT: 29.7 % — ABNORMAL LOW (ref 39.0–52.0)
Hemoglobin: 10.8 g/dL — ABNORMAL LOW (ref 13.0–17.0)
MCH: 29.7 pg (ref 26.0–34.0)
MCHC: 36.4 g/dL — ABNORMAL HIGH (ref 30.0–36.0)
MCV: 81.6 fL (ref 80.0–100.0)
Platelets: 396 10*3/uL (ref 150–400)
RBC: 3.64 MIL/uL — ABNORMAL LOW (ref 4.22–5.81)
RDW: 13 % (ref 11.5–15.5)
WBC: 10 10*3/uL (ref 4.0–10.5)
nRBC: 0 % (ref 0.0–0.2)

## 2021-02-07 LAB — HEPATITIS B SURFACE ANTIGEN: Hepatitis B Surface Ag: NONREACTIVE

## 2021-02-07 LAB — LACTATE DEHYDROGENASE: LDH: 550 U/L — ABNORMAL HIGH (ref 98–192)

## 2021-02-07 LAB — HEPATITIS PANEL, ACUTE
HCV Ab: NONREACTIVE
Hep A IgM: NONREACTIVE
Hep B C IgM: NONREACTIVE
Hepatitis B Surface Ag: NONREACTIVE

## 2021-02-07 LAB — HEPARIN LEVEL (UNFRACTIONATED): Heparin Unfractionated: 0.35 IU/mL (ref 0.30–0.70)

## 2021-02-07 LAB — URINALYSIS, ROUTINE W REFLEX MICROSCOPIC
Bacteria, UA: NONE SEEN
Bilirubin Urine: NEGATIVE
Glucose, UA: NEGATIVE mg/dL
Ketones, ur: NEGATIVE mg/dL
Leukocytes,Ua: NEGATIVE
Nitrite: NEGATIVE
Protein, ur: 30 mg/dL — AB
Specific Gravity, Urine: 1.011 (ref 1.005–1.030)
pH: 5 (ref 5.0–8.0)

## 2021-02-07 LAB — HEMOGLOBIN A1C
Hgb A1c MFr Bld: 5.9 % — ABNORMAL HIGH (ref 4.8–5.6)
Mean Plasma Glucose: 122.63 mg/dL

## 2021-02-07 LAB — RPR: RPR Ser Ql: NONREACTIVE

## 2021-02-07 LAB — SEDIMENTATION RATE: Sed Rate: 80 mm/hr — ABNORMAL HIGH (ref 0–16)

## 2021-02-07 LAB — HIV ANTIBODY (ROUTINE TESTING W REFLEX): HIV Screen 4th Generation wRfx: NONREACTIVE

## 2021-02-07 LAB — HEPATITIS B SURFACE ANTIBODY,QUALITATIVE: Hep B S Ab: REACTIVE — AB

## 2021-02-07 IMAGING — XA IR FLUORO GUIDE CV LINE*R*
3 series · 6 of 6 positions shown · non-contrast
Comparison: none

INDICATION: ESRD requiring HD.

[Series 1: fl (-) angio · 1 of 1 slices shown (1 of 2)]
[im 1/1]
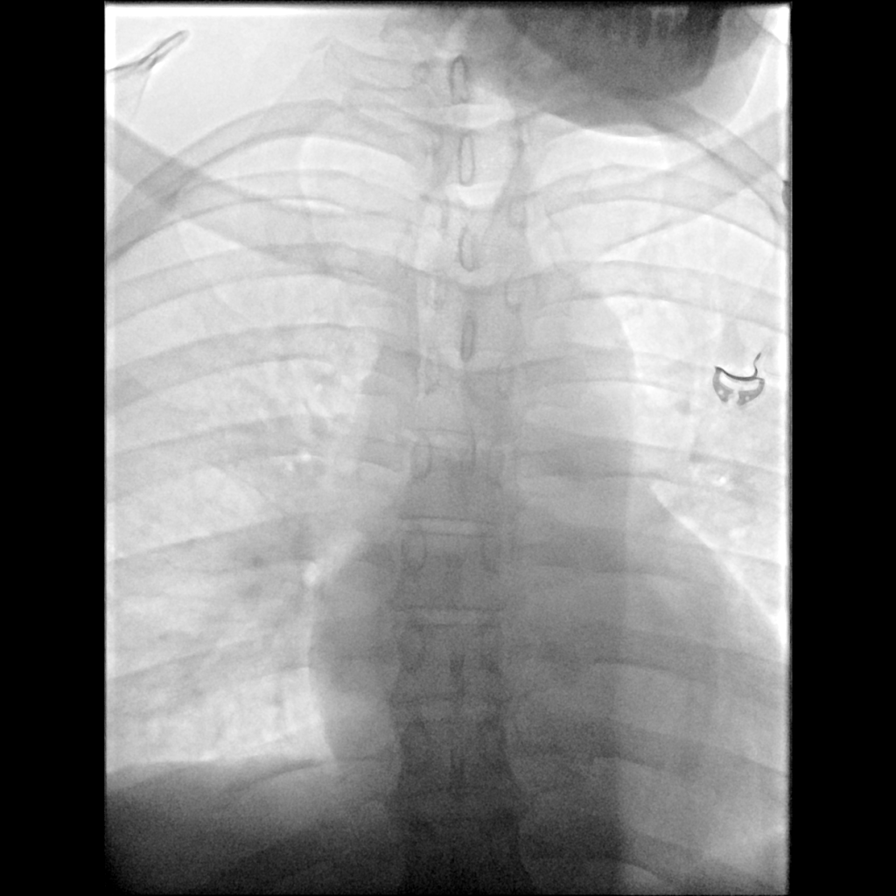

[Series 1: ir fluoro guide cv line*right* · 2 of 2 slices shown]
[im 1/2]
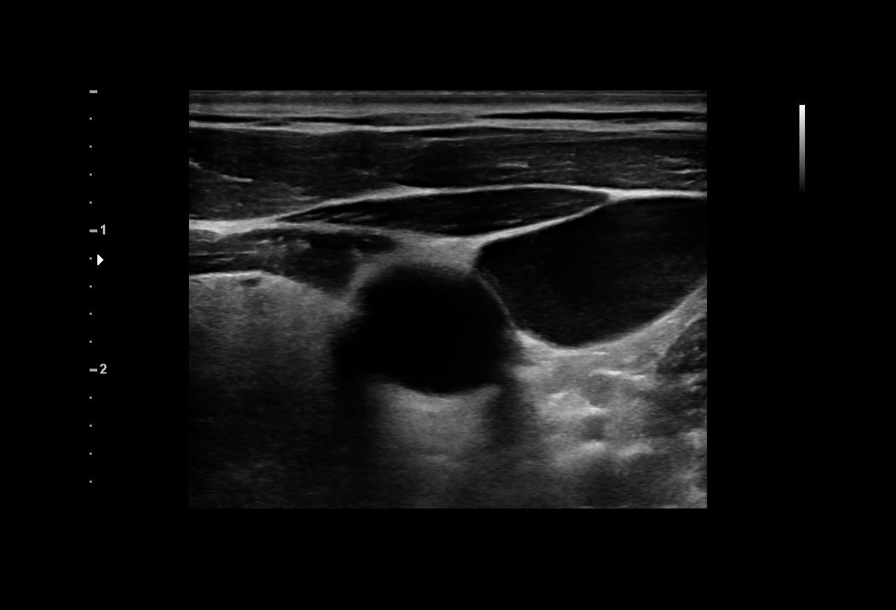
[im 2/2]
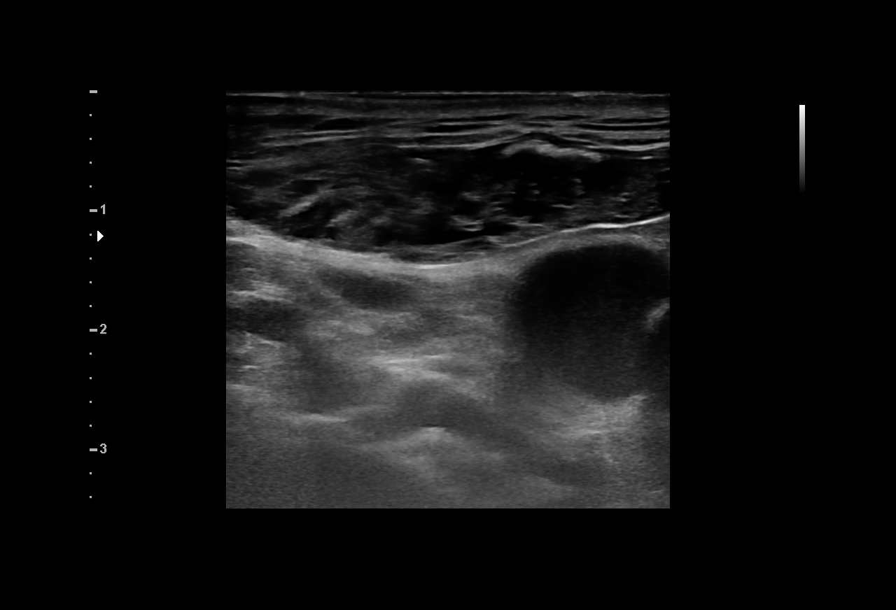

[Series 2: fl (-) angio · 3 of 3 slices shown (2 of 2)]
[im 1/3]
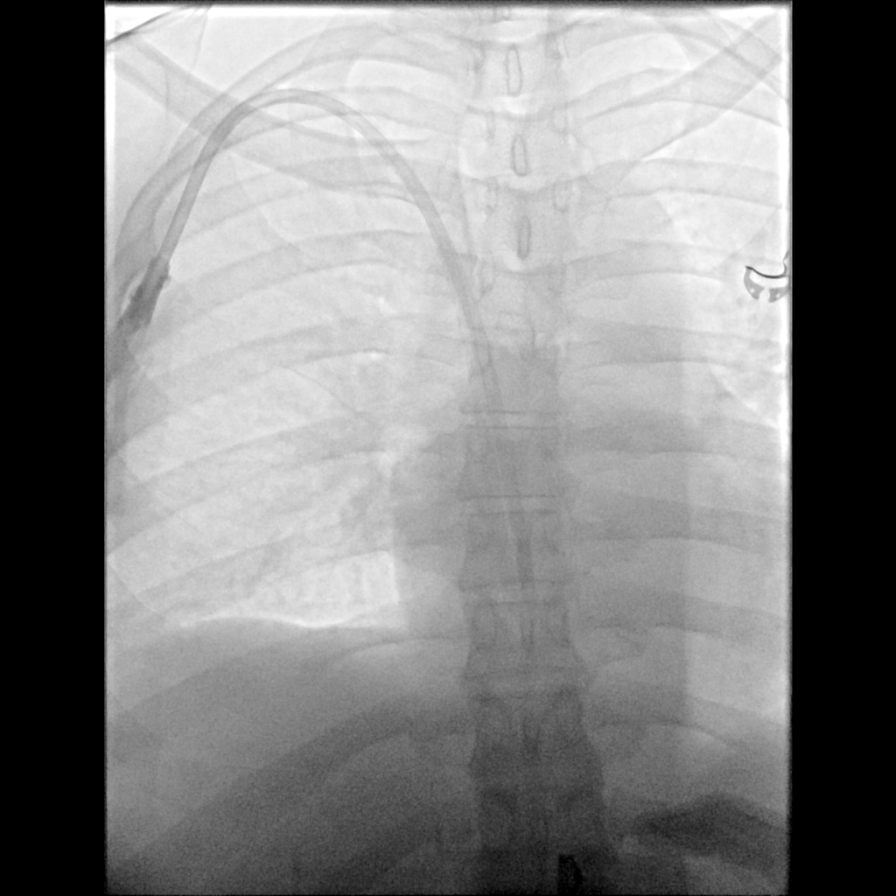
[im 2/3]
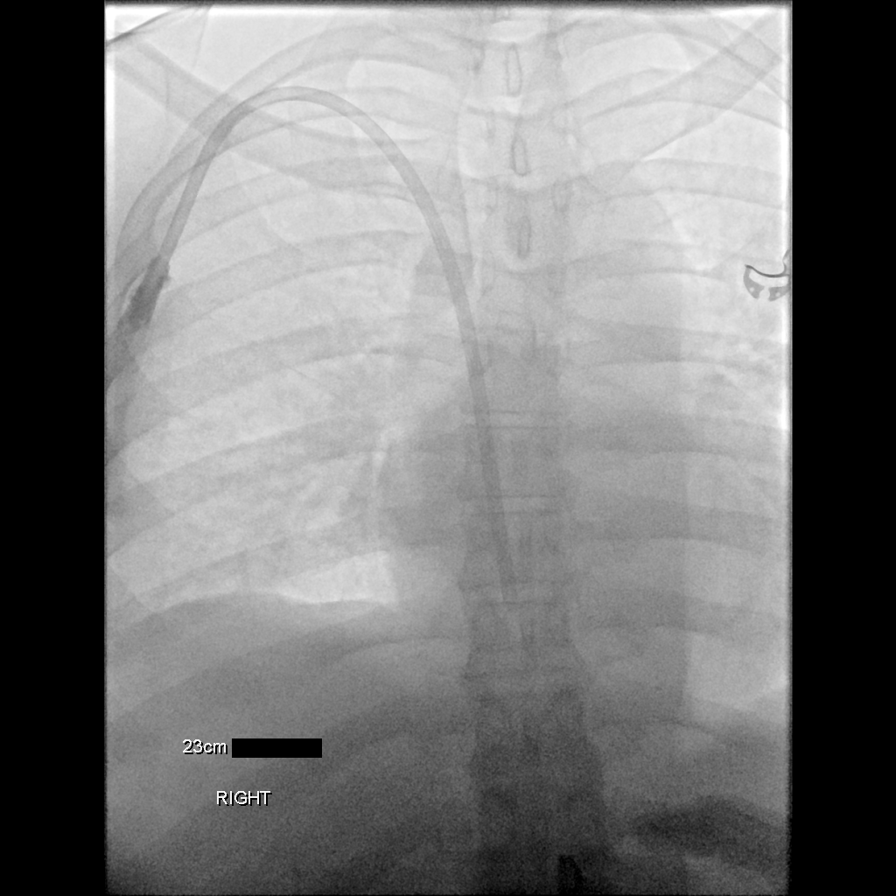
[im 3/3]
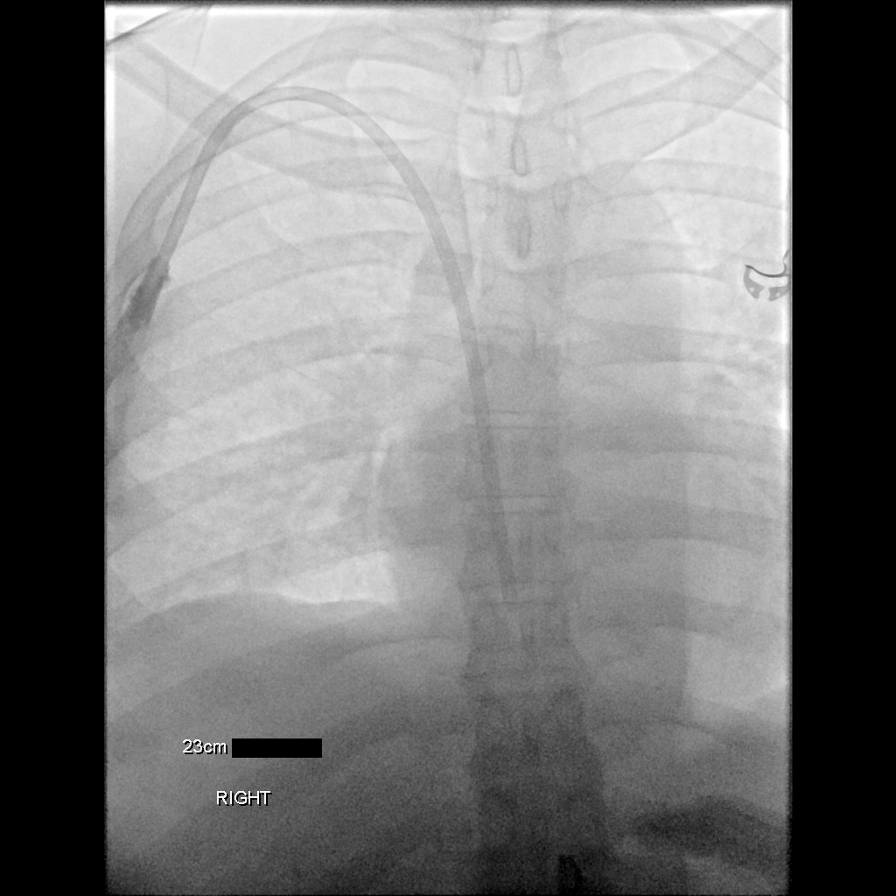

[6 of 6 positions shown; findings below may reference images not displayed]

EXAM:
TUNNELED CENTRAL VENOUS HEMODIALYSIS CATHETER PLACEMENT WITH
ULTRASOUND AND FLUOROSCOPIC GUIDANCE

MEDICATIONS:
Ancef 2 gm IV . The antibiotic was given in an appropriate time
interval prior to skin puncture.

ANESTHESIA/SEDATION:
Moderate (conscious) sedation was employed during this procedure. A
total of Versed 1 mg and Fentanyl 25 mcg was administered
intravenously.

Moderate Sedation Time: 16 minutes. The patient's level of
consciousness and vital signs were monitored continuously by
radiology nursing throughout the procedure under my direct
supervision.

FLUOROSCOPY TIME:  0 minutes 30 seconds (4 mGy).

COMPLICATIONS:
None immediate.

PROCEDURE:
Informed written consent was obtained from the the patient and/or
patient's representative after a discussion of the risks, benefits,
and alternatives to treatment. Questions regarding the procedure
were encouraged and answered. The RIGHT neck and chest were prepped
with chlorhexidine in a sterile fashion, and a sterile drape was
applied covering the operative field. Maximum barrier sterile
technique with sterile gowns and gloves were used for the procedure.
A timeout was performed prior to the initiation of the procedure.

After creating a small venotomy incision, a micropuncture kit was
utilized to access the internal jugular vein. Real-time ultrasound
guidance was utilized for vascular access including the acquisition
of a permanent ultrasound image documenting patency of the accessed
vessel. The microwire was utilized to measure appropriate catheter
length.

A stiff Glidewire was advanced to the level of the IVC and the
micropuncture sheath was exchanged for a peel-away sheath. A
tunneled hemodialysis catheter measuring 23 cm from tip to cuff was
tunneled in a retrograde fashion from the anterior chest wall to the
venotomy incision.

The catheter was then placed through the peel-away sheath with tips
ultimately positioned within the superior aspect of the right
atrium. Final catheter positioning was confirmed and documented with
a spot radiographic image. The catheter aspirates and flushes
normally. The catheter was flushed with appropriate volume heparin
dwells.

The catheter exit site was secured with a 0-Silk retention suture.
The venotomy incision was closed with Dermabond. Dressings were
applied. The patient tolerated the procedure well without immediate
post procedural complication.
IMPRESSION: Successful placement of 23 cm tunneled hemodialysis catheter via the
RIGHT internal jugular vein, as above.

The catheter is ready for immediate use.

## 2021-02-07 IMAGING — MR MR HEAD WO/W CM
10 of 16 series · 28 of 48 positions shown · IV contrast (gadavist)
Comparison: None.

CLINICAL DATA: Motor neuron disease suspected

EXAM:
MRI HEAD WITHOUT AND WITH CONTRAST
TECHNIQUE: Multiplanar, multiecho pulse sequences of the brain and surrounding
structures were obtained without and with intravenous contrast.
CONTRAST:  10mL GADAVIST GADOBUTROL 1 MMOL/ML IV SOLN

[Series 3: DWI · axial · 3.0mm · 1.09mm/px · z∈[-57,+81]mm · 6 of 94 slices shown (1 of 4)]
[im 1/94]
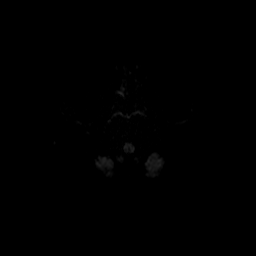
[im 19/94]
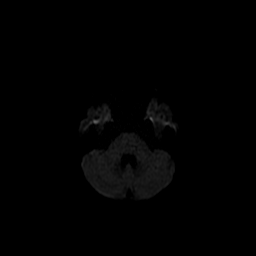
[im 38/94]
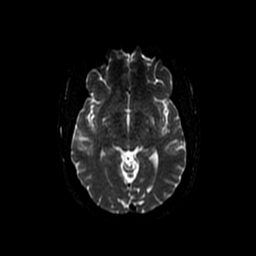
[im 56/94]
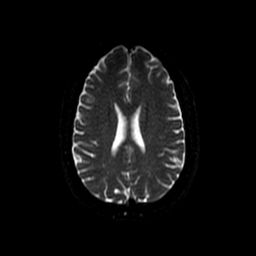
[im 75/94]
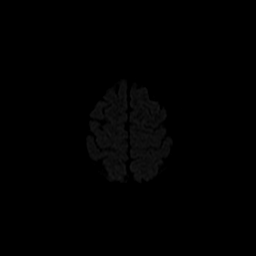
[im 94/94]
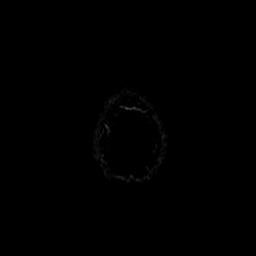

[Series 4: DWI · coronal · 5.0mm · 1.09mm/px · 3 of 68 slices shown (2 of 4)]
[im 1/68]
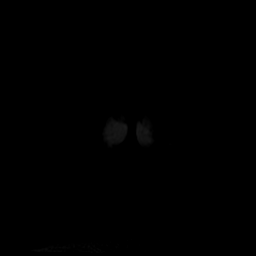
[im 34/68]
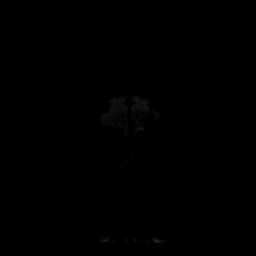
[im 68/68]
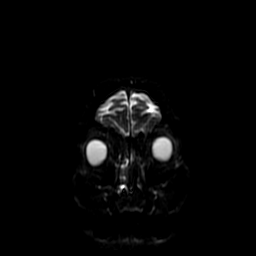

[Series 6: T2 · axial · 5.0mm · 0.45mm/px · 1 of 26 slices shown]
[im 1/26]
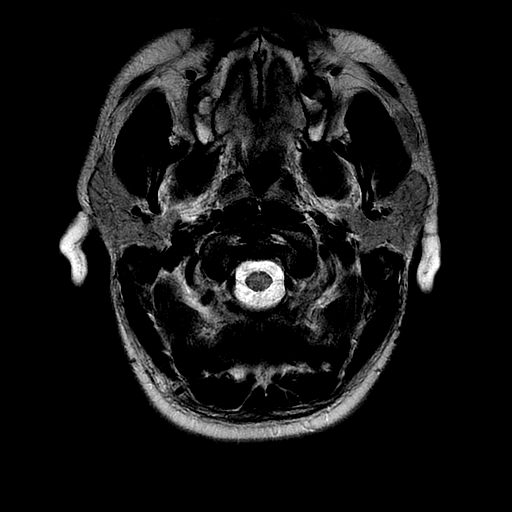

[Series 7: FLAIR · sagittal · 1.2mm · 0.49mm/px · 8 of 272 slices shown (1 of 2)]
[im 1/272]
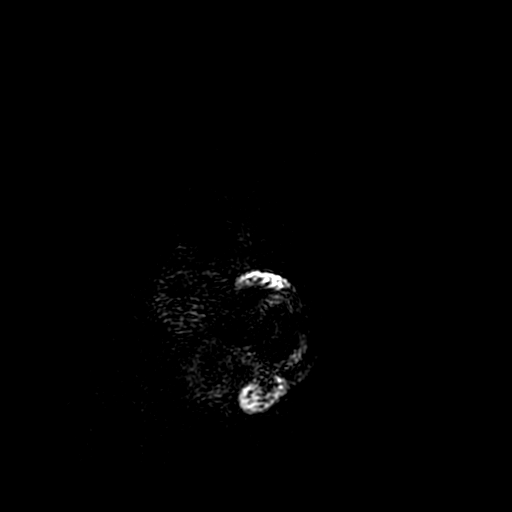
[im 42/272]
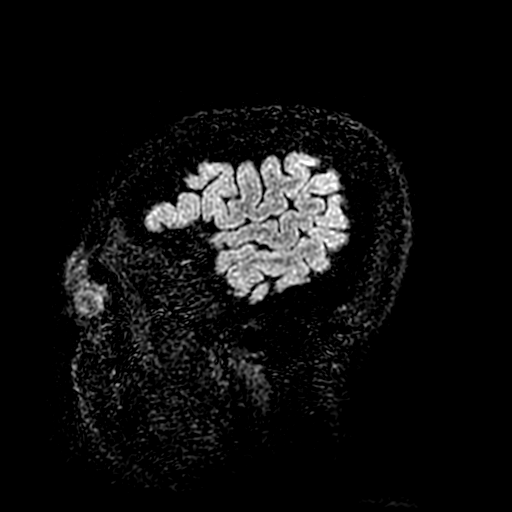
[im 84/272]
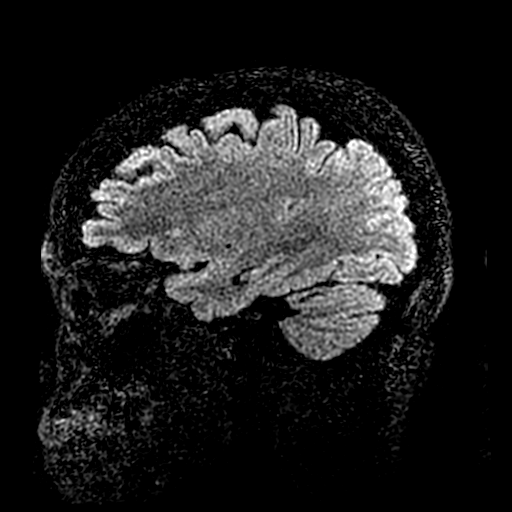
[im 126/272]
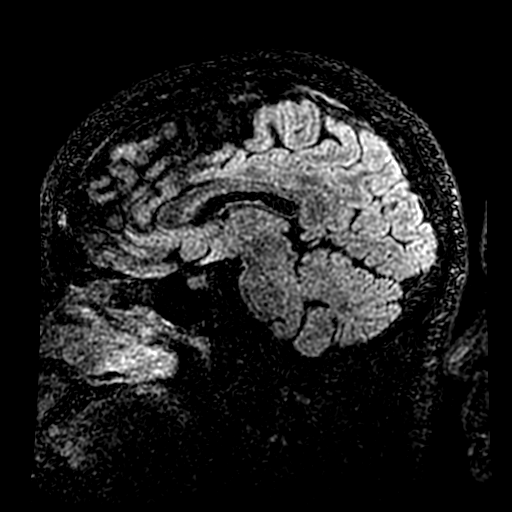
[im 146/272]
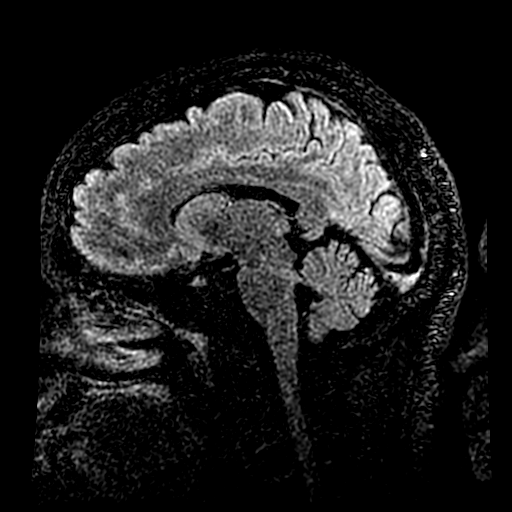
[im 188/272]
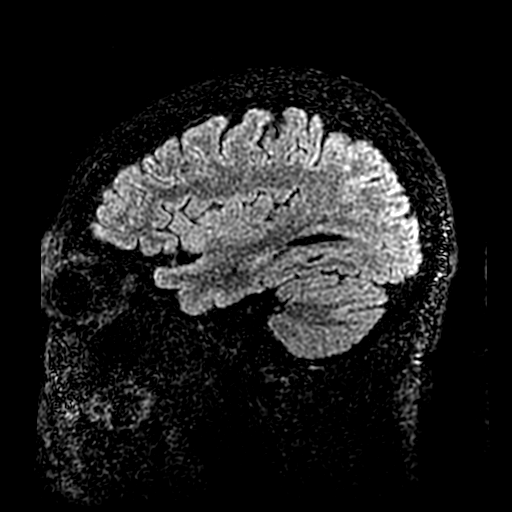
[im 230/272]
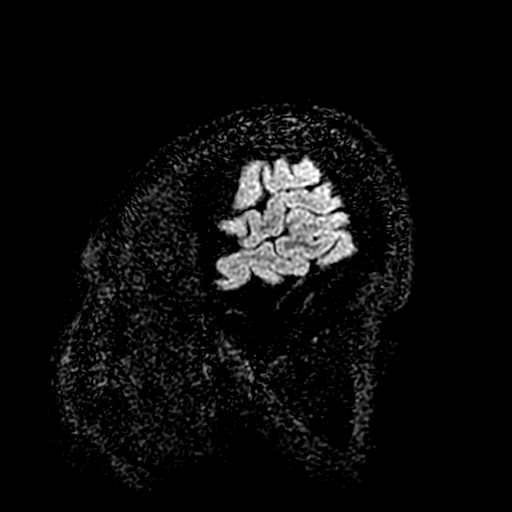
[im 272/272]
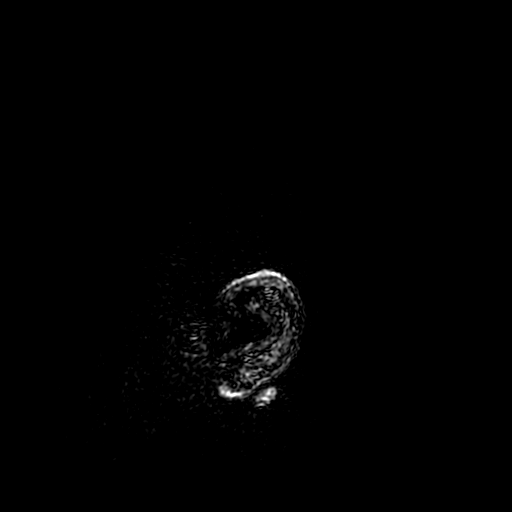

[Series 8: FLAIR · axial · 3.0mm · 0.45mm/px · 1 of 26 slices shown (2 of 2)]
[im 1/26]
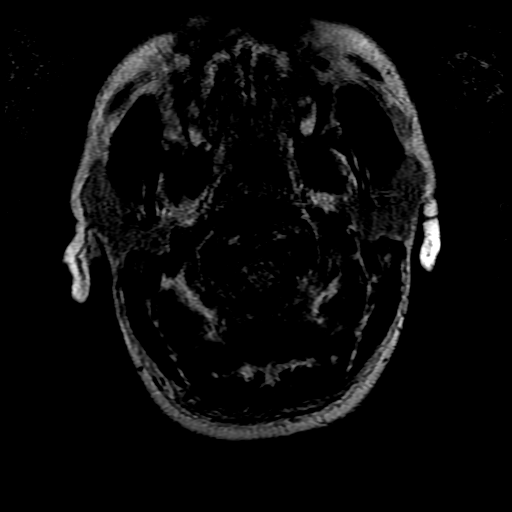

[Series 11: T2 post-contrast · coronal · 5.0mm · 0.39mm/px · 1 of 27 slices shown]
[im 1/27]
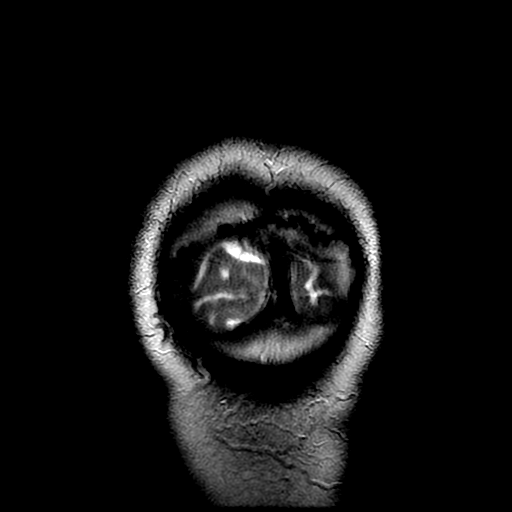

[Series 12: T1 post-contrast · axial · 3.0mm · 0.47mm/px · z∈[-70,+76]mm · 3 of 50 slices shown (1 of 2)]
[im 1/50]
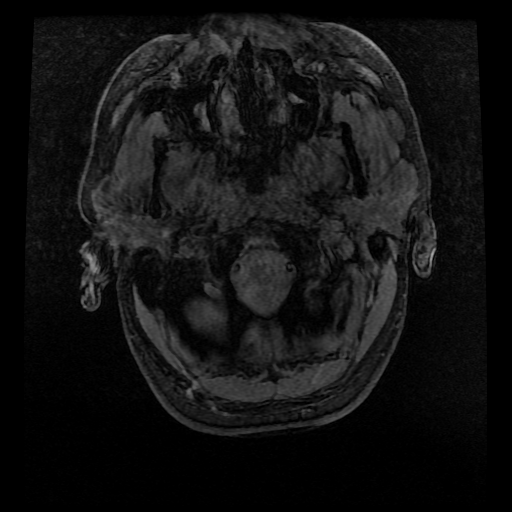
[im 25/50]
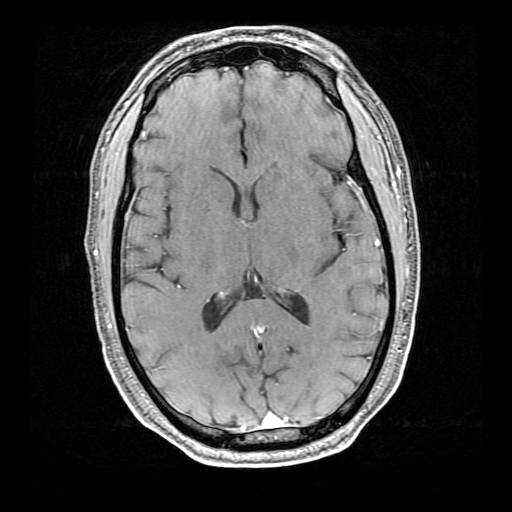
[im 50/50]
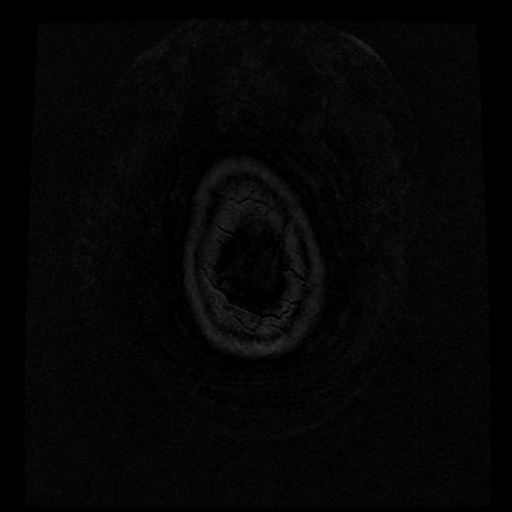

[Series 13: T1 post-contrast · coronal · 5.0mm · 0.39mm/px · 1 of 27 slices shown (2 of 2)]
[im 1/27]
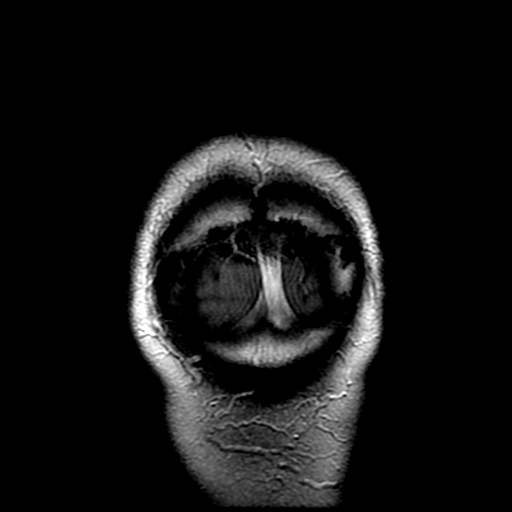

[Series 300: DWI · axial · 3.0mm · 1.09mm/px · z∈[-57,+81]mm · 2 of 47 slices shown (3 of 4)]
[im 1/47]
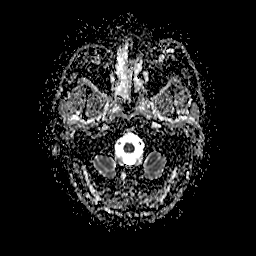
[im 47/47]
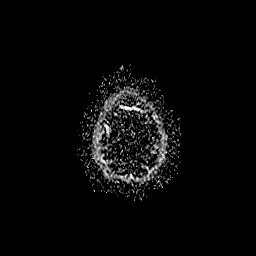

[Series 400: DWI · coronal · 5.0mm · 1.09mm/px · 2 of 34 slices shown (4 of 4)]
[im 1/34]
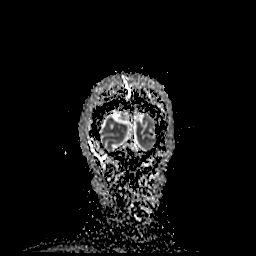
[im 34/34]
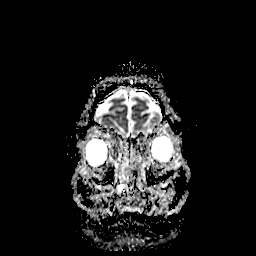

[28 of 48 positions shown; findings below may reference images not displayed]

FINDINGS: Motion limited exam.

Brain: No acute infarction, hemorrhage, hydrocephalus, extra-axial
collection or mass lesion. Mild scattered scattered T2/FLAIR
hyperintensities in the white matter. No abnormal enhancement.

Vascular: Major arterial flow voids are maintained at the skull
base.

Skull and upper cervical spine: Normal marrow signal.

Sinuses/Orbits: Largely clear sinuses.  Unremarkable orbits.

Other: Mastoid effusions
IMPRESSION: 1. No evidence of acute abnormality on this motion limited exam.
2. Mild scattered T2/FLAIR hyperintensities within the white matter,
which are nonspecific and not particularly advanced in number for
age.

## 2021-02-07 MED ORDER — TRAZODONE HCL 50 MG PO TABS
50.0000 mg | ORAL_TABLET | Freq: Every evening | ORAL | Status: DC | PRN
  Administered 2021-02-07 – 2021-02-16 (×9): 50 mg via ORAL
  Filled 2021-02-07 (×9): qty 1

## 2021-02-07 MED ORDER — ALBUTEROL SULFATE (2.5 MG/3ML) 0.083% IN NEBU: 2.5000 mg | INHALATION_SOLUTION | RESPIRATORY_TRACT | Status: DC | PRN

## 2021-02-07 MED ORDER — HYDROMORPHONE HCL 1 MG/ML IJ SOLN
1.0000 mg | Freq: Once | INTRAMUSCULAR | Status: AC
  Administered 2021-02-07: 02:00:00 1 mg via INTRAVENOUS
  Filled 2021-02-07: qty 1

## 2021-02-07 MED ORDER — HYDRALAZINE HCL 20 MG/ML IJ SOLN
5.0000 mg | Freq: Four times a day (QID) | INTRAMUSCULAR | Status: DC | PRN
  Administered 2021-02-07 – 2021-02-16 (×5): 5 mg via INTRAVENOUS
  Filled 2021-02-07 (×7): qty 1

## 2021-02-07 MED ORDER — HEPARIN SODIUM (PORCINE) 1000 UNIT/ML IJ SOLN
INTRAMUSCULAR | Status: AC
  Filled 2021-02-07: qty 10

## 2021-02-07 MED ORDER — CALCIUM GLUCONATE-NACL 2-0.675 GM/100ML-% IV SOLN
2.0000 g | Freq: Once | INTRAVENOUS | Status: AC
  Administered 2021-02-07: 14:00:00 2000 mg via INTRAVENOUS
  Filled 2021-02-07: qty 100

## 2021-02-07 MED ORDER — CLONIDINE HCL 0.1 MG PO TABS
0.1000 mg | ORAL_TABLET | Freq: Three times a day (TID) | ORAL | Status: DC
Start: 2021-02-07 — End: 2021-02-10
  Administered 2021-02-07 – 2021-02-09 (×8): 0.1 mg via ORAL
  Filled 2021-02-07 (×8): qty 1

## 2021-02-07 MED ORDER — SODIUM CHLORIDE 0.9 % IV SOLN: 100.0000 mL | INTRAVENOUS | Status: DC | PRN

## 2021-02-07 MED ORDER — HEPARIN SODIUM (PORCINE) 1000 UNIT/ML DIALYSIS
1000.0000 [IU] | INTRAMUSCULAR | Status: DC | PRN
  Administered 2021-02-07: 1000 [IU] via INTRAVENOUS_CENTRAL
  Filled 2021-02-07: qty 1

## 2021-02-07 MED ORDER — ONDANSETRON HCL 4 MG PO TABS: 4.0000 mg | ORAL_TABLET | Freq: Four times a day (QID) | ORAL | Status: DC | PRN

## 2021-02-07 MED ORDER — HEPARIN SODIUM (PORCINE) 5000 UNIT/ML IJ SOLN
5000.0000 [IU] | Freq: Three times a day (TID) | INTRAMUSCULAR | Status: DC
  Filled 2021-02-07 (×5): qty 1

## 2021-02-07 MED ORDER — OXYCODONE HCL 5 MG PO TABS
5.0000 mg | ORAL_TABLET | Freq: Four times a day (QID) | ORAL | Status: AC | PRN
  Administered 2021-02-07 – 2021-02-09 (×6): 5 mg via ORAL
  Filled 2021-02-07 (×6): qty 1

## 2021-02-07 MED ORDER — METOPROLOL TARTRATE 5 MG/5ML IV SOLN
5.0000 mg | Freq: Four times a day (QID) | INTRAVENOUS | Status: DC | PRN
  Administered 2021-02-07 – 2021-02-15 (×9): 5 mg via INTRAVENOUS
  Filled 2021-02-07 (×10): qty 5

## 2021-02-07 MED ORDER — ACETAMINOPHEN 650 MG RE SUPP: 650.0000 mg | Freq: Four times a day (QID) | RECTAL | Status: DC | PRN

## 2021-02-07 MED ORDER — FENTANYL CITRATE (PF) 100 MCG/2ML IJ SOLN
INTRAMUSCULAR | Status: AC
  Filled 2021-02-07: qty 2

## 2021-02-07 MED ORDER — SENNOSIDES-DOCUSATE SODIUM 8.6-50 MG PO TABS: 1.0000 | ORAL_TABLET | Freq: Every evening | ORAL | Status: DC | PRN

## 2021-02-07 MED ORDER — PENTAFLUOROPROP-TETRAFLUOROETH EX AERO: 1.0000 "application " | INHALATION_SPRAY | CUTANEOUS | Status: DC | PRN

## 2021-02-07 MED ORDER — MIDAZOLAM HCL 2 MG/2ML IJ SOLN
INTRAMUSCULAR | Status: AC
  Filled 2021-02-07: qty 2

## 2021-02-07 MED ORDER — LIDOCAINE HCL (PF) 1 % IJ SOLN
5.0000 mL | INTRAMUSCULAR | Status: DC | PRN
  Filled 2021-02-07: qty 5

## 2021-02-07 MED ORDER — CHLORHEXIDINE GLUCONATE CLOTH 2 % EX PADS
6.0000 | MEDICATED_PAD | Freq: Every day | CUTANEOUS | Status: DC
  Administered 2021-02-08 – 2021-02-17 (×6): 6 via TOPICAL

## 2021-02-07 MED ORDER — CEFAZOLIN SODIUM-DEXTROSE 2-4 GM/100ML-% IV SOLN: 2.0000 g | Freq: Once | INTRAVENOUS | Status: AC

## 2021-02-07 MED ORDER — ONDANSETRON HCL 4 MG/2ML IJ SOLN: 4.0000 mg | Freq: Four times a day (QID) | INTRAMUSCULAR | Status: DC | PRN

## 2021-02-07 MED ORDER — HEPARIN SODIUM (PORCINE) 5000 UNIT/ML IJ SOLN
INTRAMUSCULAR | Status: AC
  Filled 2021-02-07: qty 1

## 2021-02-07 MED ORDER — GADOBUTROL 1 MMOL/ML IV SOLN
10.0000 mL | Freq: Once | INTRAVENOUS | Status: AC | PRN
  Administered 2021-02-07: 10:00:00 10 mL via INTRAVENOUS

## 2021-02-07 MED ORDER — LIDOCAINE-PRILOCAINE 2.5-2.5 % EX CREA
1.0000 "application " | TOPICAL_CREAM | CUTANEOUS | Status: DC | PRN
  Filled 2021-02-07: qty 5

## 2021-02-07 MED ORDER — LIDOCAINE-EPINEPHRINE 1 %-1:100000 IJ SOLN
INTRAMUSCULAR | Status: AC
  Administered 2021-02-07: 13:00:00 10 mL
  Filled 2021-02-07: qty 1

## 2021-02-07 MED ORDER — CEFAZOLIN SODIUM-DEXTROSE 2-4 GM/100ML-% IV SOLN
INTRAVENOUS | Status: AC
  Administered 2021-02-07: 12:00:00 2 g via INTRAVENOUS
  Filled 2021-02-07: qty 100

## 2021-02-07 MED ORDER — ALTEPLASE 2 MG IJ SOLR: 2.0000 mg | Freq: Once | INTRAMUSCULAR | Status: DC | PRN

## 2021-02-07 MED ORDER — HEPARIN BOLUS VIA INFUSION
5000.0000 [IU] | Freq: Once | INTRAVENOUS | Status: AC
  Administered 2021-02-07: 04:00:00 5000 [IU] via INTRAVENOUS
  Filled 2021-02-07: qty 5000

## 2021-02-07 MED ORDER — ACETAMINOPHEN 325 MG PO TABS: 650.0000 mg | ORAL_TABLET | Freq: Four times a day (QID) | ORAL | Status: DC | PRN

## 2021-02-07 MED ORDER — CEFAZOLIN SODIUM-DEXTROSE 2-4 GM/100ML-% IV SOLN: 2.0000 g | Freq: Three times a day (TID) | INTRAVENOUS | Status: DC

## 2021-02-07 MED ORDER — FENTANYL CITRATE (PF) 100 MCG/2ML IJ SOLN
INTRAMUSCULAR | Status: AC | PRN
  Administered 2021-02-07: 25 ug via INTRAVENOUS

## 2021-02-07 MED ORDER — MIDAZOLAM HCL 2 MG/2ML IJ SOLN
INTRAMUSCULAR | Status: AC | PRN
  Administered 2021-02-07 (×2): .5 mg via INTRAVENOUS

## 2021-02-07 MED ORDER — HEPARIN SODIUM (PORCINE) 1000 UNIT/ML IJ SOLN
INTRAMUSCULAR | Status: AC | PRN
  Administered 2021-02-07: 3780 [IU] via INTRAVENOUS

## 2021-02-07 MED ORDER — HEPARIN (PORCINE) 25000 UT/250ML-% IV SOLN
1400.0000 [IU]/h | INTRAVENOUS | Status: DC
  Administered 2021-02-07: 03:00:00 1400 [IU]/h via INTRAVENOUS
  Filled 2021-02-07: qty 250

## 2021-02-07 NOTE — Progress Notes (Signed)
Lower extremity venous bilateral attempted x3. Patient in MRI, then patient in IR, now with MD. Will attempt again as schedule and patient availability permits.   Jean Rosenthal, RDMS, RVT

## 2021-02-07 NOTE — Plan of Care (Signed)

## 2021-02-07 NOTE — Progress Notes (Signed)
PROGRESS NOTE    Vernal Paganini  [id number] DOB: 1985/12/03 DOA: 02/06/2021 PCP: Patient, No Pcp Per (Inactive)    Chief Complaint  Patient presents with   Chest Pain   Nausea    Brief Narrative:   35 year old male without significant past medical history, possible hypertension, admitted for generalized weakness and fatigue, his work-up was significant for severe renal failure, with a creatinine of 23, he is admitted for further work-up.  Assessment & Plan:   Principal Problem:   Acute kidney injury (AKI) with acute tubular necrosis (ATN) (HCC) Active Problems:   Hypocalcemia   Non-traumatic rhabdomyolysis   Hypertension   Weakness generalized   Paresthesia  Severe acute kidney injury -No labs for last 2 years, cannot rule out progressive CKD, but this is unlikely. -This is most likely related to glomerulonephritis, patient will have renal biopsy done tomorrow as discussed with renal. -Plan for HD today via TDC, was placed by IR. -Continue to follow autoimmune work-up,  -PR, hepatitis panel and HIV is negative, hemoglobin A1c is 5.9.  Elevated total CK -Rhabdomyolysis versus myositis -Follow on how to immune work-up -Neurology input greatly appreciated -Follow Neff and vital capacity every 6 hours per neurology recommendation  Hypertension-uncontrolled, started on clonidine, will uptitrate as needed, but would expect improving blood pressure with dialysis, will add as needed hydralazine  Hyponatremia -Management with HD  Anion gap metabolic acidosis -Due to AKI, should correct with HD  Hypocalcemia -Corrected around 6.7, repleted by renal.  Will receive high calcium bath with dialysis  Will DC heparin GTT as venous Dopplers negative.    DVT prophylaxis: \[place] heparin  Code Status: Full Family Communication: D/W wife at bedside Disposition:   Status is: Inpatient  Remains inpatient appropriate because: will start HD       Consultants:   Renal Neurology IR  Procedures:  - [place] placment by IR 12/29   Subjective:  Patient reports generalized weakness, fatigue Objective: Vitals:   02/07/21 1250 02/07/21 1255 02/07/21 1305 02/07/21 1331  BP: (!) 176/101 (!) 173/105  (!) 175/106  Pulse: 98 98 95 96  Resp: (!) 22 19 16  (!) 22  Temp:    98 F (36.7 C)  TempSrc:    Oral  SpO2: 100% 100% 97% 97%  Weight:      Height:        Intake/Output Summary (Last 24 hours) at 02/07/2021 1501 Last data filed at 02/07/2021 1426 Gross per 24 hour  Intake 1100 ml  Output 300 ml  Net 800 ml   Filed Weights   02/06/21 1917  Weight: 101.6 kg    Examination:  Awake Alert, Oriented X 3, ill-appearing Symmetrical Chest wall movement, Good air movement bilaterally, some minimal increased RRR,No Gallops,Rubs or new Murmurs, No Parasternal Heave +ve B.Sounds, Abd Soft, No tenderness, No rebound - guarding or rigidity. No Cyanosis, Clubbing ,+ edema, No new Rash or bruise       Data Reviewed: I have personally reviewed following labs and imaging studies  CBC: Recent Labs  Lab 02/06/21 1932 02/06/21 2005 02/07/21 0705  WBC 10.3  --  10.0  NEUTROABS  --  7.9*  --   HGB 11.4*  --  10.8*  HCT 31.6*  --  29.7*  MCV 81.9  --  81.6  PLT 397  --  [id number]    Basic Metabolic Panel: Recent Labs  Lab 02/06/21 1932 02/06/21 2005 02/07/21 0705  NA 127*  --  127*  K 4.2  --  3.7  CL 89*  --  91*  CO2 10*  --  11*  GLUCOSE 132*  --  105*  BUN 199*  --  209*  CREATININE 23.58*  --  23.31*  CALCIUM 6.3*  --  5.8*  MG  --  2.2  --     GFR: Estimated Creatinine Clearance: 5.4 mL/min (A) (by C-G formula based on SCr of 23.31 mg/dL (H)).  Liver Function Tests: Recent Labs  Lab 02/06/21 2005 02/07/21 0705  AST 42* 35  ALT 140* 113*  ALKPHOS 47 48  BILITOT 0.3 0.6  PROT 6.4* 5.5*  ALBUMIN 3.5 2.7*    CBG: No results for input(s): GLUCAP in the last 168 hours.   Recent Results (from the past 240 hour(s))  Resp  Panel by RT-PCR (Flu A&B, Covid) Nasopharyngeal Swab     Status: None   Collection Time: 02/06/21  7:33 PM   Specimen: Nasopharyngeal Swab; Nasopharyngeal(NP) swabs in vial transport medium  Result Value Ref Range Status   SARS Coronavirus 2 by RT PCR NEGATIVE NEGATIVE Final    Comment: (NOTE) SARS-CoV-2 target nucleic acids are NOT DETECTED.  The SARS-CoV-2 RNA is generally detectable in upper respiratory specimens during the acute phase of infection. The lowest concentration of SARS-CoV-2 viral copies this assay can detect is 138 copies/mL. A negative result does not preclude SARS-Cov-2 infection and should not be used as the sole basis for treatment or other patient management decisions. A negative result may occur with  improper specimen collection/handling, submission of specimen other than nasopharyngeal swab, presence of viral mutation(s) within the areas targeted by this assay, and inadequate number of viral copies(<138 copies/mL). A negative result must be combined with clinical observations, patient history, and epidemiological information. The expected result is Negative.  Fact Sheet for Patients:  BloggerCourse.com  Fact Sheet for Healthcare Providers:  SeriousBroker.it  This test is no t yet approved or cleared by the [place] FDA and  has been authorized for detection and/or diagnosis of SARS-CoV-2 by FDA under an Emergency Use Authorization (EUA). This EUA will remain  in effect (meaning this test can be used) for the duration of the COVID-19 declaration under Section 564(b)(1) of the Act, 21 U.S.C.section 360bbb-3(b)(1), unless the authorization is terminated  or revoked sooner.       Influenza A by PCR NEGATIVE NEGATIVE Final   Influenza B by PCR NEGATIVE NEGATIVE Final    Comment: (NOTE) The Xpert Xpress SARS-CoV-2/FLU/RSV plus assay is intended as an aid in the diagnosis of influenza from Nasopharyngeal  swab specimens and should not be used as a sole basis for treatment. Nasal washings and aspirates are unacceptable for Xpert Xpress SARS-CoV-2/FLU/RSV testing.  Fact Sheet for Patients: BloggerCourse.com  Fact Sheet for Healthcare Providers: SeriousBroker.it  This test is not yet approved or cleared by the [place] FDA and has been authorized for detection and/or diagnosis of SARS-CoV-2 by FDA under an Emergency Use Authorization (EUA). This EUA will remain in effect (meaning this test can be used) for the duration of the COVID-19 declaration under Section 564(b)(1) of the Act, 21 U.S.C. section 360bbb-3(b)(1), unless the authorization is terminated or revoked.  Performed at [facility], [address], [place], [place] [address]          Radiology Studies: DG Chest 2 View  Result Date: 02/06/2021 CLINICAL DATA:  Shortness of breath. Ten day history of nausea, vomiting, diarrhea, chest pain, and shortness of breath. EXAM: CHEST - 2 VIEW COMPARISON:  None. FINDINGS: The heart size and mediastinal contours are within normal limits. Both lungs are clear. The visualized skeletal structures are unremarkable. IMPRESSION: No active cardiopulmonary disease. Electronically Signed   By: Lucienne Capers M.D.   On: 02/06/2021 20:02   MR BRAIN W WO CONTRAST  Result Date: 02/07/2021 CLINICAL DATA:  Motor neuron disease suspected EXAM: MRI HEAD WITHOUT AND WITH CONTRAST TECHNIQUE: Multiplanar, multiecho pulse sequences of the brain and surrounding structures were obtained without and with intravenous contrast. CONTRAST:  63mL GADAVIST GADOBUTROL 1 MMOL/ML IV SOLN COMPARISON:  None. FINDINGS: Motion limited exam. Brain: No acute infarction, hemorrhage, hydrocephalus, extra-axial collection or mass lesion. Mild scattered scattered T2/FLAIR hyperintensities in the white matter. No abnormal enhancement. Vascular: Major  arterial flow voids are maintained at the skull base. Skull and upper cervical spine: Normal marrow signal. Sinuses/Orbits: Largely clear sinuses.  Unremarkable orbits. Other: Mastoid effusions IMPRESSION: 1. No evidence of acute abnormality on this motion limited exam. 2. Mild scattered T2/FLAIR hyperintensities within the white matter, which are nonspecific and not particularly advanced in number for age. Electronically Signed   By: Margaretha Sheffield M.D.   On: 02/07/2021 10:24   US Renal  Result Date: 02/06/2021 CLINICAL DATA:  Renal failure EXAM: RENAL / URINARY TRACT ULTRASOUND COMPLETE COMPARISON:  None. FINDINGS: Right Kidney: Renal measurements: 13.9 x 7 x 7.5 cm = volume: 380 mL. Cortex appears slightly echogenic. No mass or hydronephrosis. Left Kidney: Renal measurements: 12.8 x 7.9 x 6.5 cm = volume: 343 mL. Cortex appears slightly echogenic. No hydronephrosis. Suspect focally prominent cortical tissue/possible column of Bertin at the mid kidney. Bladder: Appears normal for degree of bladder distention. Other: Incidental right pleural effusion. IMPRESSION: Kidneys prominent in size. Cortex slightly echogenic consistent with medical renal disease. No hydronephrosis. Electronically Signed   By: Donavan Foil M.D.   On: 02/06/2021 21:35        Scheduled Meds:  Chlorhexidine Gluconate Cloth  6 each Topical Q0600   cloNIDine  0.1 mg Oral TID   heparin       heparin sodium (porcine)       Continuous Infusions:  sodium chloride     sodium chloride       LOS: 0 days       Phillips Climes, MD [facility]   To contact the attending provider between 7A-7P or the covering provider during after hours 7P-7A, please log into the web site [email or web address] and access using universal [place] password for that web site. If you do not have the password, please call the hospital operator.  02/07/2021, 3:01 PM   Patient ID: Orvin Ratte, male   DOB: 02/02/1986, 35 y.o.   MRN:  [id number]

## 2021-02-07 NOTE — Consult Note (Signed)
NEURO HOSPITALIST CONSULT NOTE   Requestig physician: Dr. Randol Kern  Reason for Consult: Severe diffuse muscle weakness  History obtained from:   Patient and Chart     HPI:                                                                                                                                          Erik Griffith is an 35 y.o. male who presented to the [facility] ED on Wednesday evening with a 10-day history of diffuse muscle weakness. Symptoms began abruptly on Monday 10/19, while the patient was staying at his mother's  house for the Christmas holiday. He had been sleeping on the couch and on that day he noticed paresthesias of his feet in conjunction with new onset whole-body weakness. He notes that he was so weak that he was unable to get up off the couch on his own. His arms were also weak, but less so. He felt short of breath as well, starting on that Monday. For the next 10 days he has been bedbound, staying in his mother's bed at her house. He has had progressive worsening of the paresthesias, which have ascended from his feet to include the entirety of his legs, then involving his chest and then his arms. He states that sensation of touch and temperature were preserved. He has had severe muscle aches and tenderness to palpation. No facial rash or rash involving his fingers, but he does have a rash along the inferior aspects of his thighs along with some sores, although it is unclear if this is due to his prolonged immobility or an intrinsic rash.   Of note, his urine had turned "almost black" on the Monday that the above symptoms started. The urine discoloration has subsequently resolved, per patient.   He denies any recent infectious illnesses or vaccinations. No changes in diet or consumption of unusual or illicit substances prior to onset of the above symptoms.   EDP note from yesterday has been reviewed: "Patient presents with chest pain generalized weakness.  Has  been going for around the last week and a half.  Started out as feeling a little tingly all over.  Few days later developed some nausea vomiting diarrhea.  States at the beginning he had some almost black urine.  States it is since cleared up and is normal.  She has had decreased oral intake.  States however he is feeling more weak.  More fatigued.  States he can hardly get up and move around.  States he feels weak particularly in his legs but it is more of an effort.  States he just feels worn out after any activity.  States he has to sit up from laying down and wait a few minutes before he has the strength  to stand up.  Came in with crutches and help from family members.  Had to take a wheelchair to wheeled to the back.  No fevers. May patient also is reportedly been having nosebleeds.  States that has been going over the last few days also.  No other bleeding."  History reviewed. No pertinent past medical history.  Past Surgical History:  Procedure Laterality Date   APPENDECTOMY     FLEXOR TENDON REPAIR Left 03/07/2019   Procedure: LEFT THUMB FLEXOR TENDON REPAIR;  Surgeon: Milly Jakob, MD;  Location: [place];  Service: Orthopedics;  Laterality: Left;   NERVE REPAIR Left 03/07/2019   Procedure: LEFT HAND NERVE REPAIR(S);  Surgeon: Milly Jakob, MD;  Location: [place];  Service: Orthopedics;  Laterality: Left;    History reviewed. No pertinent family history.            Social History:  reports that he has been smoking cigarettes. He has been smoking an average of .25 packs per day. He has never used smokeless tobacco. He reports current alcohol use. He reports current drug use. Drug: Marijuana.  No Known Allergies  MEDICATIONS:                                                                                                                     Prior to Admission:  Medications Prior to Admission  Medication Sig Dispense Refill Last Dose   acetaminophen  (TYLENOL) 325 MG tablet Take 2 tablets (650 mg total) by mouth every 6 (six) hours. (Patient not taking: Reported on 05/02/2019)      ibuprofen (ADVIL) 600 MG tablet Take 1 tablet (600 mg total) by mouth every 6 (six) hours. (Patient not taking: Reported on 05/02/2019)      oxyCODONE (OXY IR/ROXICODONE) 5 MG immediate release tablet Take 1 tablet (5 mg total) by mouth every 6 (six) hours as needed (severe breakthru postop pain). (Patient not taking: Reported on 05/02/2019) 25 tablet 0    Scheduled:  Chlorhexidine Gluconate Cloth  6 each Topical Q0600   heparin       Continuous:  sodium chloride     sodium chloride     heparin 1,400 Units/hr (02/07/21 0317)     ROS:  As per HPI.    Blood pressure (!) 190/108, pulse 98, temperature 98.2 F (36.8 C), temperature source Oral, resp. rate (!) 29, height 5\' 11"  (1.803 m), weight 101.6 kg, SpO2 97 %.   General Examination:                                                                                                       Physical Exam  HEENT-  [place]/AT    Lungs- Tachypneic Extremities- No edema. Muscle beds with significant tenderness to palpation.   Neurological Examination Mental Status: Awake and alert. Fully oriented. Speech fluent without evidence of aphasia.  Able to follow all commands without difficulty. Good memory and insight.  Cranial Nerves: II: Temporal visual fields intact bilaterally with no extinction to DSS.  PERRL.  III,IV, VI: EOMI without double vision or nystagmus. No ptosis.  V: Temp sensation intact bilaterally VII: Smile symmetric VIII: Hearing intact to voice IX,X: No hypophonia or hoarseness XI: Symmetric shoulder shrug XII: Midline tongue extension Motor: RUE 4/5 proximally and distally LUE 4/5 proximally and distally RLE 2/5 hip flexion, knee extension, knee flexion,  ADF/APF LLE 2/5 hip flexion, knee extension, knee flexion, ADF/APF Sensory: Temp and light touch intact throughout, bilaterally, including feet and hands.  Deep Tendon Reflexes: 2+ bilateral brachioradialis, biceps and triceps. 0 bilateral patellae. 1+ bilateral achilles Plantars: Right: downgoing   Left: downgoing Cerebellar: No ataxia with FNF bilaterally  Gait: Unable to assess   Lab Results: Basic Metabolic Panel: Recent Labs  Lab 02/06/21 1932 02/06/21 2005  NA 127*  --   K 4.2  --   CL 89*  --   CO2 10*  --   GLUCOSE 132*  --   BUN 199*  --   CREATININE 23.58*  --   CALCIUM 6.3*  --   MG  --  2.2    CBC: Recent Labs  Lab 02/06/21 1932 02/06/21 2005  WBC 10.3  --   NEUTROABS  --  7.9*  HGB 11.4*  --   HCT 31.6*  --   MCV 81.9  --   PLT 397  --     Cardiac Enzymes: Recent Labs  Lab 02/06/21 2005  CKTOTAL 12,351*    Lipid Panel: No results for input(s): CHOL, TRIG, HDL, CHOLHDL, VLDL, LDLCALC in the last 168 hours.  Imaging: DG Chest 2 View  Result Date: 02/06/2021 CLINICAL DATA:  Shortness of breath. Ten day history of nausea, vomiting, diarrhea, chest pain, and shortness of breath. EXAM: CHEST - 2 VIEW COMPARISON:  None. FINDINGS: The heart size and mediastinal contours are within normal limits. Both lungs are clear. The visualized skeletal structures are unremarkable. IMPRESSION: No active cardiopulmonary disease. Electronically Signed   By: Lucienne Capers M.D.   On: 02/06/2021 20:02   US Renal  Result Date: 02/06/2021 CLINICAL DATA:  Renal failure EXAM: RENAL / URINARY TRACT ULTRASOUND COMPLETE COMPARISON:  None. FINDINGS: Right Kidney: Renal measurements: 13.9 x 7 x 7.5 cm = volume: 380 mL. Cortex appears slightly echogenic. No mass or hydronephrosis. Left Kidney: Renal measurements: 12.8  x 7.9 x 6.5 cm = volume: 343 mL. Cortex appears slightly echogenic. No hydronephrosis. Suspect focally prominent cortical tissue/possible column of Bertin at the  mid kidney. Bladder: Appears normal for degree of bladder distention. Other: Incidental right pleural effusion. IMPRESSION: Kidneys prominent in size. Cortex slightly echogenic consistent with medical renal disease. No hydronephrosis. Electronically Signed   By: Donavan Foil M.D.   On: 02/06/2021 21:35     Assessment: 35 year old male presenting with a 10 day history of weakness and tingling, preceded by dark, almost black urine. Found to be in renal failure at the [facility] ED with a Cr of 23, BUN of 200 and electrolyte disturbances. Also with probable rhabdomyolysis given CK of 25,000. He was transferred to [facility] for further evaluation. Neurology has been consulted to assess for a possible primary neurological etiology for the patient's weakness.  1. Exam reveals diffuse muscle weakness, severe in BLE (2/5) and mild to moderate in BUE (4/5), as well as neck flexion weakness rated at 4/5. Severe tenderness to palpation of select muscle beds. No sensory loss. All reflexes except for patellars are present.  2. DDx:  - Overall presentation is most consistent with an inflammatory myositis. One possibility would be initial rhabdomyolysis episode precipitating renal failure. Renal failure in turn could result in acidosis with compensatory tachypnea. Electrolyte derangement from renal failure may also explain his paresthesias.  - GBS is felt to be unlikely based on overall clinical features and intact upper extremity and achilles tendon reflexes  Recommendations: 1. Respiratory therapy consult for FVC and NIF q6h (ordered) 2. Continuous pulse ox 3. May need to be transferred to a higher acuity bed for closer respiratory monitoring.  4. MRI of right thigh to assess for specific myositis pattern (ordered) 5. ANA is pending 6. Autoimmune myositis antibodies as send out to [facility] (ordered):  - Antisynthetase antibodies (anti-Jo-1, anti-PL-7 and anti-PL-12),  - Anti-signal recognition particle (SRP),  -  Anti-TIF1-? antibody (human transcriptional intermediary factor),  - Anti-NXP2 (nuclear matrix protein 2),  - Anti-SAE (anti-small ubiquitin like modifier activating enzyme heterodimer) antibody,  - Anti-PM/Scl (associated with lung problems and an overlap of polymyositis and scleroderma),  - anti-HMGCR antibody,  - Anti-Ro/SSA .Marland Kitchen 7. PT/OT 8. Speech/swallow evaluation for complaint of dysphagia   Electronically signed: Dr. Kerney Elbe 02/07/2021, 2:30 AM

## 2021-02-07 NOTE — Progress Notes (Signed)
NIF -40cm H2O I.S. 2L  Good effort.

## 2021-02-07 NOTE — Progress Notes (Signed)
Nephrology Follow-Up Consult note   Assessment/Recommendations: Erik Griffith is a/an 35 y.o. male with a past medical history significant for possible htn, admitted for severe renal failure.     Presumptive Severe Acute Kidney Injury; cannot rule out progressive CKD: normal Crt about 2 years ago but no data since that time.  Some of the symptoms preceded by possible viral illness.  Now w/ severe renal failure w/ some uremia and decreased UOP. Also with severe weakness and nose bleeds. History of UA w/ protein and hematuria. Awaiting further labs at this time but big concern for GN. Has acute need for HD but is urinating some. -Plan for HD today via [facility] -> HD again on 12/30 -appreciate [facility] placement by IR -RPR, hepatitis panel, and HIV negative. Hgb a1c 5.9 -RUS with slightly large kidneys (~13cm) and increase cortical echogenicity -Consult IR also for kidney biopsy on 12/30 if possible (need to control BP before proceeding) -Continue to monitor daily Cr, Dose meds for GFR -Monitor Daily I/Os, Daily weight  -Maintain MAP>65 for optimal renal perfusion.  -Avoid nephrotoxic medications including NSAIDs -F/u further GN and urine studies -LDH was slightly elevated as well as CK.  Team is looking into myositis.  Unlikely the main contributor for his AKI.  Given platelets are normal TMA is less likely but remains possible.  Consider further work-up based on kidney biopsy results  Hypertension: history of such, unclear control.  Higher at this time.  Need to get systolic blood pressure under 646 prior to kidney biopsy.  Start clonidine 0.1 mg 3 times daily and uptitrate as needed.  Ultrafiltration with dialysis today.  Consider further ultrafiltration tomorrow based on response.  Weakness: Work-up per nephrology.  Likely multifactorial.  Profound uremia could definitely be a contributing factor  Hyponatremia: Likely related to free water retention in the setting of severe AKI.  Dialysis as  above  Anion gap metabolic acidosis: Anion gap largely driven by retention of anions in the setting of severe AKI.  Dialysis as above  Hypocalcemia: Corrects to around 6.7.  Repeat calcium gluconate 2 g now.  High calcium bath with dialysis.   Recommendations conveyed to primary service.    Darnell Level [facility] 02/07/2021 2:13 PM  ___________________________________________________________  CC: Weakness  Interval History/Subjective: Patient has been stable. [facility] placement with IR awaiting hemodialysis.  Patient continues to be very tired and weak.  Urinating more clearly today.  Good appetite.  No significant nausea.   Medications:  Current Facility-Administered Medications  Medication Dose Route Frequency Provider Last Rate Last Admin   0.9 %  sodium chloride infusion  100 mL Intravenous PRN Annie Sable, MD       0.9 %  sodium chloride infusion  100 mL Intravenous PRN Annie Sable, MD       acetaminophen (TYLENOL) tablet 650 mg  650 mg Oral Q6H PRN Chotiner, Claudean Severance, MD       Or   acetaminophen (TYLENOL) suppository 650 mg  650 mg Rectal Q6H PRN Chotiner, Claudean Severance, MD       albuterol (PROVENTIL) (2.5 MG/3ML) 0.083% nebulizer solution 2.5 mg  2.5 mg Nebulization Q4H PRN Chotiner, Claudean Severance, MD       alteplase (CATHFLO ACTIVASE) injection 2 mg  2 mg Intracatheter Once PRN Annie Sable, MD       calcium gluconate 2 g/ 100 mL sodium chloride IVPB  2 g Intravenous Once Darnell Level, MD 100 mL/hr at 02/07/21 1342 2,000 mg at  02/07/21 1342   Chlorhexidine Gluconate Cloth 2 % PADS 6 each  6 each Topical Q0600 Corliss Parish, MD       heparin 5000 UNIT/ML injection            heparin ADULT infusion 100 units/mL (25000 units/251mL)  1,400 Units/hr Intravenous Continuous Chotiner, Yevonne Aline, MD 14 mL/hr at 02/07/21 0317 1,400 Units/hr at 02/07/21 0317   heparin injection 1,000 Units  1,000 Units Dialysis PRN  Corliss Parish, MD       heparin sodium (porcine) 1000 UNIT/ML injection            lidocaine (PF) (XYLOCAINE) 1 % injection 5 mL  5 mL Intradermal PRN Corliss Parish, MD       lidocaine-prilocaine (EMLA) cream 1 application  1 application Topical PRN Corliss Parish, MD       metoprolol tartrate (LOPRESSOR) injection 5 mg  5 mg Intravenous Q6H PRN Chotiner, Yevonne Aline, MD   5 mg at 02/07/21 0650   ondansetron (ZOFRAN) tablet 4 mg  4 mg Oral Q6H PRN Chotiner, Yevonne Aline, MD       Or   ondansetron (ZOFRAN) injection 4 mg  4 mg Intravenous Q6H PRN Chotiner, Yevonne Aline, MD       oxyCODONE (Oxy IR/ROXICODONE) immediate release tablet 5 mg  5 mg Oral Q6H PRN Chotiner, Yevonne Aline, MD       pentafluoroprop-tetrafluoroeth (GEBAUERS) aerosol 1 application  1 application Topical PRN Corliss Parish, MD       senna-docusate (Senokot-S) tablet 1 tablet  1 tablet Oral QHS PRN Chotiner, Yevonne Aline, MD       traZODone (DESYREL) tablet 50 mg  50 mg Oral QHS PRN Chotiner, Yevonne Aline, MD   50 mg at 02/07/21 [id number]      Review of Systems: 10 systems reviewed and negative except per interval history/subjective  Physical Exam: Vitals:   02/07/21 1305 02/07/21 1331  BP:  (!) 175/106  Pulse: 95 96  Resp: 16 (!) 22  Temp:  98 F (36.7 C)  SpO2: 97% 97%   Total I/O In: 100 [IV Piggyback:100] Out: 175 [Urine:175]  Intake/Output Summary (Last 24 hours) at 02/07/2021 1413 Last data filed at 02/07/2021 1250 Gross per 24 hour  Intake 1100 ml  Output 175 ml  Net 925 ml   Constitutional: Ill-appearing, lying in bed, no distress ENMT: ears and nose without scars or lesions, MMM CV: normal rate, trace edema in the bilateral lower extremities Respiratory: clear to auscultation, bilateral chest rise with increased work of breathing Gastrointestinal: soft, non-tender, no palpable masses or hernias Skin: no visible lesions or rashes Psych: alert, judgement/insight appropriate, appropriate mood  and affect   Test Results I personally reviewed new and old clinical labs and radiology tests Lab Results  Component Value Date   NA 127 (L) 02/07/2021   K 3.7 02/07/2021   CL 91 (L) 02/07/2021   CO2 11 (L) 02/07/2021   BUN 209 (H) 02/07/2021   CREATININE 23.31 (H) 02/07/2021   CALCIUM 5.8 (LL) 02/07/2021   ALBUMIN 2.7 (L) 02/07/2021

## 2021-02-07 NOTE — Consult Note (Signed)
Chief Complaint: Patient was seen in consultation today for  Chief Complaint  Patient presents with   Chest Pain   Nausea, acute renal failure     Referring Physician(s): Dr. Joylene Grapes  Supervising Physician: Jacqulynn Cadet  Patient Status: [facility] - In-pt  History of Present Illness: Erik Griffith is a 35 y.o. male with no significant past medical history. He presented to the [facility] ED 02/06/21 with weakness, nausea/vomiting/diarrhea and dark urine for 10 days. He was found to be in acute, possibly end stage renal failure with a creatinine level of 23 and BUN of 199. No known inciting event per patient report. IR was consulted for placement of a tunneled dialysis catheter and this was done 02/07/21. The patient has undergone two sessions of hemodialysis.  Interventional Radiology has been asked by the nephrology team to evaluate this patient for an image-guided non-focal renal biopsy for further work up.   History reviewed. No pertinent past medical history.  Past Surgical History:  Procedure Laterality Date   APPENDECTOMY     FLEXOR TENDON REPAIR Left 03/07/2019   Procedure: LEFT THUMB FLEXOR TENDON REPAIR;  Surgeon: Milly Jakob, MD;  Location: [place];  Service: Orthopedics;  Laterality: Left;   IR FLUORO GUIDE CV LINE RIGHT  02/07/2021   IR US GUIDE VASC ACCESS RIGHT  02/07/2021   NERVE REPAIR Left 03/07/2019   Procedure: LEFT HAND NERVE REPAIR(S);  Surgeon: Milly Jakob, MD;  Location: [place];  Service: Orthopedics;  Laterality: Left;    Allergies: Patient has no known allergies.  Medications: Prior to Admission medications   Medication Sig Start Date End Date Taking? Authorizing Provider  acetaminophen (TYLENOL) 325 MG tablet Take 2 tablets (650 mg total) by mouth every 6 (six) hours. Patient not taking: Reported on 05/02/2019 03/07/19   Milly Jakob, MD  ibuprofen (ADVIL) 600 MG tablet Take 1 tablet (600 mg total) by mouth every  6 (six) hours. Patient not taking: Reported on 05/02/2019 03/07/19   Milly Jakob, MD  oxyCODONE (OXY IR/ROXICODONE) 5 MG immediate release tablet Take 1 tablet (5 mg total) by mouth every 6 (six) hours as needed (severe breakthru postop pain). Patient not taking: Reported on 05/02/2019 03/07/19   Milly Jakob, MD     History reviewed. No pertinent family history.  Social History   Socioeconomic History   Marital status: Single    Spouse name: Not on file   Number of children: Not on file   Years of education: Not on file   Highest education level: Not on file  Occupational History   Not on file  Tobacco Use   Smoking status: Every Day    Packs/day: 0.25    Types: Cigarettes   Smokeless tobacco: Never  Substance and Sexual Activity   Alcohol use: Yes    Comment: occ   Drug use: Yes    Types: Marijuana   Sexual activity: Not on file  Other Topics Concern   Not on file  Social History Narrative   Not on file   Social Determinants of Health   Financial Resource Strain: Not on file  Food Insecurity: Not on file  Transportation Needs: Not on file  Physical Activity: Not on file  Stress: Not on file  Social Connections: Not on file    Review of Systems: A 12 point ROS discussed and pertinent positives are indicated in the HPI above.  All other systems are negative.  Review of Systems  Reason unable to perform  ROS: Patien asleep; information obtained from the patient's wife.  Constitutional:  Positive for fatigue.  Respiratory:  Positive for shortness of breath.   Cardiovascular:  Positive for leg swelling. Negative for chest pain.  Gastrointestinal:  Negative for abdominal pain, diarrhea, nausea and vomiting.   Vital Signs: BP (!) 184/110    Pulse 88    Temp 98.7 F (37.1 C) (Oral)    Resp 17    Ht $R'5\' 11"'iZ$  (1.803 m)    Wt 241 lb 2.9 oz (109.4 kg)    SpO2 99%    BMI 33.64 kg/m   Physical Exam Constitutional:      General: He is not in acute distress.     Appearance: He is not ill-appearing.  Cardiovascular:     Rate and Rhythm: Regular rhythm. Tachycardia present.     Pulses: Normal pulses.     Heart sounds: Normal heart sounds.  Pulmonary:     Effort: Pulmonary effort is normal.     Breath sounds: Normal breath sounds.  Abdominal:     General: Bowel sounds are normal.     Palpations: Abdomen is soft.    Imaging: DG Chest 2 View  Result Date: 02/06/2021 CLINICAL DATA:  Shortness of breath. Ten day history of nausea, vomiting, diarrhea, chest pain, and shortness of breath. EXAM: CHEST - 2 VIEW COMPARISON:  None. FINDINGS: The heart size and mediastinal contours are within normal limits. Both lungs are clear. The visualized skeletal structures are unremarkable. IMPRESSION: No active cardiopulmonary disease. Electronically Signed   By: Lucienne Capers M.D.   On: 02/06/2021 20:02   MR BRAIN W WO CONTRAST  Result Date: 02/07/2021 CLINICAL DATA:  Motor neuron disease suspected EXAM: MRI HEAD WITHOUT AND WITH CONTRAST TECHNIQUE: Multiplanar, multiecho pulse sequences of the brain and surrounding structures were obtained without and with intravenous contrast. CONTRAST:  22mL GADAVIST GADOBUTROL 1 MMOL/ML IV SOLN COMPARISON:  None. FINDINGS: Motion limited exam. Brain: No acute infarction, hemorrhage, hydrocephalus, extra-axial collection or mass lesion. Mild scattered scattered T2/FLAIR hyperintensities in the white matter. No abnormal enhancement. Vascular: Major arterial flow voids are maintained at the skull base. Skull and upper cervical spine: Normal marrow signal. Sinuses/Orbits: Largely clear sinuses.  Unremarkable orbits. Other: Mastoid effusions IMPRESSION: 1. No evidence of acute abnormality on this motion limited exam. 2. Mild scattered T2/FLAIR hyperintensities within the white matter, which are nonspecific and not particularly advanced in number for age. Electronically Signed   By: Margaretha Sheffield M.D.   On: 02/07/2021 10:24   US  Renal  Result Date: 02/06/2021 CLINICAL DATA:  Renal failure EXAM: RENAL / URINARY TRACT ULTRASOUND COMPLETE COMPARISON:  None. FINDINGS: Right Kidney: Renal measurements: 13.9 x 7 x 7.5 cm = volume: 380 mL. Cortex appears slightly echogenic. No mass or hydronephrosis. Left Kidney: Renal measurements: 12.8 x 7.9 x 6.5 cm = volume: 343 mL. Cortex appears slightly echogenic. No hydronephrosis. Suspect focally prominent cortical tissue/possible column of Bertin at the mid kidney. Bladder: Appears normal for degree of bladder distention. Other: Incidental right pleural effusion. IMPRESSION: Kidneys prominent in size. Cortex slightly echogenic consistent with medical renal disease. No hydronephrosis. Electronically Signed   By: Donavan Foil M.D.   On: 02/06/2021 21:35   IR Fluoro Guide CV Line Right  Result Date: 02/07/2021 INDICATION: ESRD requiring HD. EXAM: TUNNELED CENTRAL VENOUS HEMODIALYSIS CATHETER PLACEMENT WITH ULTRASOUND AND FLUOROSCOPIC GUIDANCE MEDICATIONS: Ancef 2 gm IV . The antibiotic was given in an appropriate time interval prior to skin puncture. ANESTHESIA/SEDATION:  Moderate (conscious) sedation was employed during this procedure. A total of Versed 1 mg and Fentanyl 25 mcg was administered intravenously. Moderate Sedation Time: 16 minutes. The patient's level of consciousness and vital signs were monitored continuously by radiology nursing throughout the procedure under my direct supervision. FLUOROSCOPY TIME:  0 minutes 30 seconds (4 mGy). COMPLICATIONS: None immediate. PROCEDURE: Informed written consent was obtained from the the patient and/or patient's representative after a discussion of the risks, benefits, and alternatives to treatment. Questions regarding the procedure were encouraged and answered. The RIGHT neck and chest were prepped with chlorhexidine in a sterile fashion, and a sterile drape was applied covering the operative field. Maximum barrier sterile technique with sterile  gowns and gloves were used for the procedure. A timeout was performed prior to the initiation of the procedure. After creating a small venotomy incision, a micropuncture kit was utilized to access the internal jugular vein. Real-time ultrasound guidance was utilized for vascular access including the acquisition of a permanent ultrasound image documenting patency of the accessed vessel. The microwire was utilized to measure appropriate catheter length. A stiff Glidewire was advanced to the level of the IVC and the micropuncture sheath was exchanged for a peel-away sheath. A tunneled hemodialysis catheter measuring 23 cm from tip to cuff was tunneled in a retrograde fashion from the anterior chest wall to the venotomy incision. The catheter was then placed through the peel-away sheath with tips ultimately positioned within the superior aspect of the right atrium. Final catheter positioning was confirmed and documented with a spot radiographic image. The catheter aspirates and flushes normally. The catheter was flushed with appropriate volume heparin dwells. The catheter exit site was secured with a 0-Silk retention suture. The venotomy incision was closed with Dermabond. Dressings were applied. The patient tolerated the procedure well without immediate post procedural complication. IMPRESSION: Successful placement of 23 cm tunneled hemodialysis catheter via the RIGHT internal jugular vein, as above. The catheter is ready for immediate use. Michaelle Birks, MD [facility] Radiology Electronically Signed   By: Michaelle Birks M.D.   On: 02/07/2021 15:05   IR US Guide Vasc Access Right  Result Date: 02/07/2021 INDICATION: ESRD requiring HD. EXAM: TUNNELED CENTRAL VENOUS HEMODIALYSIS CATHETER PLACEMENT WITH ULTRASOUND AND FLUOROSCOPIC GUIDANCE MEDICATIONS: Ancef 2 gm IV . The antibiotic was given in an appropriate time interval prior to skin puncture. ANESTHESIA/SEDATION:  Moderate (conscious) sedation was employed during this procedure. A total of Versed 1 mg and Fentanyl 25 mcg was administered intravenously. Moderate Sedation Time: 16 minutes. The patient's level of consciousness and vital signs were monitored continuously by radiology nursing throughout the procedure under my direct supervision. FLUOROSCOPY TIME:  0 minutes 30 seconds (4 mGy). COMPLICATIONS: None immediate. PROCEDURE: Informed written consent was obtained from the the patient and/or patient's representative after a discussion of the risks, benefits, and alternatives to treatment. Questions regarding the procedure were encouraged and answered. The RIGHT neck and chest were prepped with chlorhexidine in a sterile fashion, and a sterile drape was applied covering the operative field. Maximum barrier sterile technique with sterile gowns and gloves were used for the procedure. A timeout was performed prior to the initiation of the procedure. After creating a small venotomy incision, a micropuncture kit was utilized to access the internal jugular vein. Real-time ultrasound guidance was utilized for vascular access including the acquisition of a permanent ultrasound image documenting patency of the accessed vessel. The microwire was utilized to measure appropriate catheter length. A  stiff Glidewire was advanced to the level of the IVC and the micropuncture sheath was exchanged for a peel-away sheath. A tunneled hemodialysis catheter measuring 23 cm from tip to cuff was tunneled in a retrograde fashion from the anterior chest wall to the venotomy incision. The catheter was then placed through the peel-away sheath with tips ultimately positioned within the superior aspect of the right atrium. Final catheter positioning was confirmed and documented with a spot radiographic image. The catheter aspirates and flushes normally. The catheter was flushed with appropriate volume heparin dwells. The catheter exit site was secured  with a 0-Silk retention suture. The venotomy incision was closed with Dermabond. Dressings were applied. The patient tolerated the procedure well without immediate post procedural complication. IMPRESSION: Successful placement of 23 cm tunneled hemodialysis catheter via the RIGHT internal jugular vein, as above. The catheter is ready for immediate use. Michaelle Birks, MD [facility] Radiology Electronically Signed   By: Michaelle Birks M.D.   On: 02/07/2021 15:05   VAS [place] LOWER EXTREMITY VENOUS (DVT)  Result Date: 02/07/2021  Lower Venous DVT Study Patient Name:  Erik Griffith  Date of Exam:   02/07/2021 Medical Rec #: [id number]     Accession #:    [id number] Date of Birth: 1985/08/07     Patient Gender: M Patient Age:   85 years Exam Location:  [facility] Procedure:      VAS [place] LOWER EXTREMITY VENOUS (DVT) Referring Phys: DAWOOD ELGERGAWY --------------------------------------------------------------------------------  Indications: Bilateral pain and weakness.  Comparison Study: No prior studies. Performing Technologist: Darlin Coco RDMS, RVT  Examination Guidelines: A complete evaluation includes B-mode imaging, spectral Doppler, color Doppler, and power Doppler as needed of all accessible portions of each vessel. Bilateral testing is considered an integral part of a complete examination. Limited examinations for reoccurring indications may be performed as noted. The reflux portion of the exam is performed with the patient in reverse Trendelenburg.  +---------+---------------+---------+-----------+----------+--------------+  RIGHT     Compressibility Phasicity Spontaneity Properties Thrombus Aging  +---------+---------------+---------+-----------+----------+--------------+  CFV       Full            Yes       Yes                                    +---------+---------------+---------+-----------+----------+--------------+  SFJ       Full                                                              +---------+---------------+---------+-----------+----------+--------------+  FV Prox   Full                                                             +---------+---------------+---------+-----------+----------+--------------+  FV Mid    Full                                                             +---------+---------------+---------+-----------+----------+--------------+  FV Distal Full                                                             +---------+---------------+---------+-----------+----------+--------------+  PFV       Full                                                             +---------+---------------+---------+-----------+----------+--------------+  POP       Full            Yes       Yes                                    +---------+---------------+---------+-----------+----------+--------------+  PTV       Full                                                             +---------+---------------+---------+-----------+----------+--------------+  PERO      Full                                                             +---------+---------------+---------+-----------+----------+--------------+   +---------+---------------+---------+-----------+----------+--------------+  LEFT      Compressibility Phasicity Spontaneity Properties Thrombus Aging  +---------+---------------+---------+-----------+----------+--------------+  CFV       Full            Yes       Yes                                    +---------+---------------+---------+-----------+----------+--------------+  SFJ       Full                                                             +---------+---------------+---------+-----------+----------+--------------+  FV Prox   Full                                                             +---------+---------------+---------+-----------+----------+--------------+  FV Mid    Full                                                              +---------+---------------+---------+-----------+----------+--------------+  FV Distal Full                                                             +---------+---------------+---------+-----------+----------+--------------+  PFV       Full                                                             +---------+---------------+---------+-----------+----------+--------------+  POP       Full            Yes       Yes                                    +---------+---------------+---------+-----------+----------+--------------+  PTV       Full                                                             +---------+---------------+---------+-----------+----------+--------------+  PERO      Full                                                             +---------+---------------+---------+-----------+----------+--------------+     Summary: RIGHT: - There is no evidence of deep vein thrombosis in the lower extremity.  - No cystic structure found in the popliteal fossa.  LEFT: - There is no evidence of deep vein thrombosis in the lower extremity.  - No cystic structure found in the popliteal fossa.  *See table(s) above for measurements and observations. Electronically signed by Harold Barban MD on 02/07/2021 at 8:03:23 PM.    Final     Labs:  CBC: Recent Labs    02/06/21 1932 02/07/21 0705 02/08/21 0112  WBC 10.3 10.0 9.0  HGB 11.4* 10.8* 10.1*  HCT 31.6* 29.7* 27.4*  PLT 397 396 328    COAGS: Recent Labs    02/08/21 0112  INR 1.2    BMP: Recent Labs    02/06/21 1932 02/07/21 0705 02/08/21 0112  NA 127* 127* 131*  K 4.2 3.7 3.7  CL 89* 91* 97*  CO2 10* 11* 18*  GLUCOSE 132* 105* 126*  BUN 199* 209* 148*  CALCIUM 6.3* 5.8* 6.9*  CREATININE 23.58* 23.31* 17.90*  GFRNONAA 2* 2* 3*    LIVER FUNCTION TESTS: Recent Labs    02/06/21 2005 02/07/21 0705 02/08/21 0112  BILITOT 0.3 0.6  --   AST 42* 35  --   ALT 140* 113*  --   ALKPHOS 47 48  --   PROT 6.4* 5.5*  --   ALBUMIN 3.5  2.7* 2.3*    TUMOR MARKERS: No results for input(s): AFPTM, CEA, CA199, CHROMGRNA in the last  8760 hours.  Assessment and Plan:  Acute renal failure of unknown etiology: Kaeo Jacome, 35 year old male, is tentatively scheduled Tuesday February 12, 2021 for an image-guided non-focal renal biopsy. He has been hypertensive with systolic pressures greater than 170 - Blood pressure would need to be below 160 to safely proceed. The procedure was discussed with the patient's wife at the bedside; consent was also signed by her. The patient has been asleep both times I have visited to explain procedure/obtain consent.  Risks and benefits of this procedure were discussed with the patient and/or patient's family including, but not limited to bleeding, infection, damage to adjacent structures or low yield requiring additional tests.  All of the questions were answered and there is agreement to proceed. He will be NPO at midnight the day of the procedure. Appropriate labs will be drawn that morning. He is not currently scheduled to receive any anticoagulation.   Consent signed and in chart.  Thank you for this interesting consult.  I greatly enjoyed meeting Subhan Hoopes and look forward to participating in their care.  A copy of this report was sent to the requesting provider on this date.  Electronically Signed: Soyla Dryer, AGACNP-BC [phone] 02/08/2021, 8:36 AM   I spent a total of 20 Minutes    in face to face in clinical consultation, greater than 50% of which was counseling/coordinating care for non-focal renal biopsy.

## 2021-02-07 NOTE — Progress Notes (Signed)
ANTICOAGULATION CONSULT NOTE - Initial Consult  Pharmacy Consult for Heparin  Indication:  Rule out DVT/PE  No Known Allergies  Patient Measurements: Height: 5\' 11"  (180.3 cm) Weight: 101.6 kg (224 lb) IBW/kg (Calculated) : 75.3  Vital Signs: Temp: 98.2 F (36.8 C) (12/29 0122) Temp Source: Oral (12/29 0122) BP: 190/108 (12/29 0122) Pulse Rate: 98 (12/29 0122)  Labs: Recent Labs    02/06/21 1932 02/06/21 2005 02/06/21 2117  HGB 11.4*  --   --   HCT 31.6*  --   --   PLT 397  --   --   CREATININE 23.58*  --   --   CKTOTAL  --  12,351*  --   TROPONINIHS 155*  --  150*    Estimated Creatinine Clearance: 5.3 mL/min (A) (by C-G formula based on SCr of 23.58 mg/dL (H)).   Medical History: History reviewed. No pertinent past medical history.   Assessment: 35 y/o M to begin heparin for rule out DVT/PE in setting of leg swelling and shortness of breath. Pt has markedly elevated Scr and rhabdo with unknown cause. Labs above.   Goal of Therapy:  Heparin level 0.3-0.7 units/ml Monitor platelets by anticoagulation protocol: Yes   Plan:  Heparin 5000 units BOLUS Start heparin drip at 1400 units/hr 1200 Heparin level Daily CBC/Heparin level Monitor for bleeding  31, PharmD, BCPS Clinical Pharmacist Phone: [phone]

## 2021-02-07 NOTE — Consult Note (Signed)
Chief Complaint: Patient was seen in consultation today for renal failure  Referring Physician(s): Dr. Moshe Cipro  Supervising Physician: Michaelle Birks  Patient Status: [facility] - In-pt  History of Present Illness: Erik Griffith is a 35 y.o. male with no significant past medical history who presents to [facility] ED with weakness, N/V/D, dark urine for the past 10 days.  Found to have acute, potentially end stage renal failure with SCr of 23, BUN 199. No known inciting event per history.  IR consulted for tunneled dialysis catheter placement at the request of Dr. Moshe Cipro.  Met with patient and wife at bedside.  Patient reports feeling poorly.  He is aware of plans to initiate dialysis. He and wife are agreeable to proceed with tunneled dialysis catheter placement.   He has been NPO this AM with only sips for meds.   History reviewed. No pertinent past medical history.  Past Surgical History:  Procedure Laterality Date   APPENDECTOMY     FLEXOR TENDON REPAIR Left 03/07/2019   Procedure: LEFT THUMB FLEXOR TENDON REPAIR;  Surgeon: Milly Jakob, MD;  Location: [place];  Service: Orthopedics;  Laterality: Left;   NERVE REPAIR Left 03/07/2019   Procedure: LEFT HAND NERVE REPAIR(S);  Surgeon: Milly Jakob, MD;  Location: [place];  Service: Orthopedics;  Laterality: Left;    Allergies: Patient has no known allergies.  Medications: Prior to Admission medications   Medication Sig Start Date End Date Taking? Authorizing Provider  acetaminophen (TYLENOL) 325 MG tablet Take 2 tablets (650 mg total) by mouth every 6 (six) hours. Patient not taking: Reported on 05/02/2019 03/07/19   Milly Jakob, MD  ibuprofen (ADVIL) 600 MG tablet Take 1 tablet (600 mg total) by mouth every 6 (six) hours. Patient not taking: Reported on 05/02/2019 03/07/19   Milly Jakob, MD  oxyCODONE (OXY IR/ROXICODONE) 5 MG immediate release tablet Take 1 tablet (5 mg total) by  mouth every 6 (six) hours as needed (severe breakthru postop pain). Patient not taking: Reported on 05/02/2019 03/07/19   Milly Jakob, MD     History reviewed. No pertinent family history.  Social History   Socioeconomic History   Marital status: Single    Spouse name: Not on file   Number of children: Not on file   Years of education: Not on file   Highest education level: Not on file  Occupational History   Not on file  Tobacco Use   Smoking status: Every Day    Packs/day: 0.25    Types: Cigarettes   Smokeless tobacco: Never  Substance and Sexual Activity   Alcohol use: Yes    Comment: occ   Drug use: Yes    Types: Marijuana   Sexual activity: Not on file  Other Topics Concern   Not on file  Social History Narrative   Not on file   Social Determinants of Health   Financial Resource Strain: Not on file  Food Insecurity: Not on file  Transportation Needs: Not on file  Physical Activity: Not on file  Stress: Not on file  Social Connections: Not on file     Review of Systems: A 12 point ROS discussed and pertinent positives are indicated in the HPI above.  All other systems are negative.  Review of Systems  Constitutional:  Positive for fatigue. Negative for fever.  Respiratory:  Negative for cough and shortness of breath.   Cardiovascular:  Negative for chest pain.  Gastrointestinal:  Positive for nausea and vomiting. Negative  for abdominal pain.  Genitourinary:  Positive for decreased urine volume.  Psychiatric/Behavioral:  Negative for behavioral problems and confusion.    Vital Signs: BP (!) 176/103 (BP Location: Left Arm)    Pulse 88    Temp 97.7 F (36.5 C) (Oral)    Resp 18    Ht $R'5\' 11"'oq$  (1.803 m)    Wt 224 lb (101.6 kg)    SpO2 96%    BMI 31.24 kg/m   Physical Exam Vitals and nursing note reviewed.  Constitutional:      General: He is not in acute distress.    Appearance: He is well-developed. He is not ill-appearing.  Cardiovascular:     Rate  and Rhythm: Normal rate and regular rhythm.  Pulmonary:     Breath sounds: Normal breath sounds.  Musculoskeletal:        General: Normal range of motion.  Skin:    General: Skin is warm and dry.  Neurological:     General: No focal deficit present.     Mental Status: He is disoriented.  Psychiatric:        Mood and Affect: Mood normal.        Behavior: Behavior normal.     MD Evaluation Airway: WNL Heart: WNL Abdomen: WNL Chest/ Lungs: WNL ASA  Classification: 3 Mallampati/Airway Score: Two   Imaging: DG Chest 2 View  Result Date: 02/06/2021 CLINICAL DATA:  Shortness of breath. Ten day history of nausea, vomiting, diarrhea, chest pain, and shortness of breath. EXAM: CHEST - 2 VIEW COMPARISON:  None. FINDINGS: The heart size and mediastinal contours are within normal limits. Both lungs are clear. The visualized skeletal structures are unremarkable. IMPRESSION: No active cardiopulmonary disease. Electronically Signed   By: Lucienne Capers M.D.   On: 02/06/2021 20:02   MR BRAIN W WO CONTRAST  Result Date: 02/07/2021 CLINICAL DATA:  Motor neuron disease suspected EXAM: MRI HEAD WITHOUT AND WITH CONTRAST TECHNIQUE: Multiplanar, multiecho pulse sequences of the brain and surrounding structures were obtained without and with intravenous contrast. CONTRAST:  27mL GADAVIST GADOBUTROL 1 MMOL/ML IV SOLN COMPARISON:  None. FINDINGS: Motion limited exam. Brain: No acute infarction, hemorrhage, hydrocephalus, extra-axial collection or mass lesion. Mild scattered scattered T2/FLAIR hyperintensities in the white matter. No abnormal enhancement. Vascular: Major arterial flow voids are maintained at the skull base. Skull and upper cervical spine: Normal marrow signal. Sinuses/Orbits: Largely clear sinuses.  Unremarkable orbits. Other: Mastoid effusions IMPRESSION: 1. No evidence of acute abnormality on this motion limited exam. 2. Mild scattered T2/FLAIR hyperintensities within the white matter,  which are nonspecific and not particularly advanced in number for age. Electronically Signed   By: Margaretha Sheffield M.D.   On: 02/07/2021 10:24   US Renal  Result Date: 02/06/2021 CLINICAL DATA:  Renal failure EXAM: RENAL / URINARY TRACT ULTRASOUND COMPLETE COMPARISON:  None. FINDINGS: Right Kidney: Renal measurements: 13.9 x 7 x 7.5 cm = volume: 380 mL. Cortex appears slightly echogenic. No mass or hydronephrosis. Left Kidney: Renal measurements: 12.8 x 7.9 x 6.5 cm = volume: 343 mL. Cortex appears slightly echogenic. No hydronephrosis. Suspect focally prominent cortical tissue/possible column of Bertin at the mid kidney. Bladder: Appears normal for degree of bladder distention. Other: Incidental right pleural effusion. IMPRESSION: Kidneys prominent in size. Cortex slightly echogenic consistent with medical renal disease. No hydronephrosis. Electronically Signed   By: Donavan Foil M.D.   On: 02/06/2021 21:35    Labs:  CBC: Recent Labs    02/06/21 1932 02/07/21 1638  WBC 10.3 10.0  HGB 11.4* 10.8*  HCT 31.6* 29.7*  PLT 397 396    COAGS: No results for input(s): INR, APTT in the last 8760 hours.  BMP: Recent Labs    02/06/21 1932 02/07/21 0705  NA 127* PENDING  K 4.2 3.7  CL 89* PENDING  CO2 10* PENDING  GLUCOSE 132* 105*  BUN 199* 209*  CALCIUM 6.3* 5.8*  CREATININE 23.58* 23.31*  GFRNONAA 2* 2*    LIVER FUNCTION TESTS: Recent Labs    02/06/21 2005 02/07/21 0705  BILITOT 0.3 0.6  AST 42* 35  ALT 140* 113*  ALKPHOS 47 48  PROT 6.4* 5.5*  ALBUMIN 3.5 2.7*    TUMOR MARKERS: No results for input(s): AFPTM, CEA, CA199, CHROMGRNA in the last 8760 hours.  Assessment and Plan: Acute renal failure Patient with acute onset renal failure of unknown cause.  SCr 23.  Plans to initiate dialysis today.  Request for tunneled dialysis catheter placement per Nephrology.  NPO this AM.   Patient and wife agreeable to proceed.   Thank you for this interesting consult.  I  greatly enjoyed meeting Erik Griffith and look forward to participating in their care.  A copy of this report was sent to the requesting provider on this date.  Electronically Signed: Docia Barrier, PA 02/07/2021, 11:30 AM   I spent a total of 40 Minutes    in face to face in clinical consultation, greater than 50% of which was counseling/coordinating care for acute renal failure.

## 2021-02-07 NOTE — Progress Notes (Signed)
PT Cancellation Note  Patient Details Name: Erik Griffith MRN: [id number] DOB: Oct 02, 1985   Cancelled Treatment:    Reason Eval/Treat Not Completed: Patient not medically ready  Noted +LE edema with dopplers ordered to r/o DVTs, MRI and neuro consults pending. Will proceed with PT evaluation as medically appropriate.   Jerolyn Center, PT Acute Rehabilitation Services  Pager [phone] Office [phone]    Zena Amos 02/07/2021, 8:44 AM

## 2021-02-07 NOTE — Progress Notes (Signed)
ANTICOAGULATION CONSULT NOTE - Initial Consult  Pharmacy Consult for Heparin  Indication:  Rule out DVT/PE  No Known Allergies  Patient Measurements: Height: 5\' 11"  (180.3 cm) Weight: 101.6 kg (224 lb) IBW/kg (Calculated) : 75.3  Vital Signs: Temp: 98 F (36.7 C) (12/29 1331) Temp Source: Oral (12/29 1331) BP: 175/106 (12/29 1331) Pulse Rate: 96 (12/29 1331)  Labs: Recent Labs    02/06/21 1932 02/06/21 2005 02/06/21 2117 02/07/21 0444 02/07/21 0705 02/07/21 0707  HGB 11.4*  --   --   --  10.8*  --   HCT 31.6*  --   --   --  29.7*  --   PLT 397  --   --   --  396  --   HEPARINUNFRC  --   --   --   --   --  0.35  CREATININE 23.58*  --   --   --  23.31*  --   CKTOTAL  --  12,351*  --  9,271*  --   --   TROPONINIHS 155*  --  150*  --   --   --      Estimated Creatinine Clearance: 5.4 mL/min (A) (by C-G formula based on SCr of 23.31 mg/dL (H)).   Medical History: History reviewed. No pertinent past medical history.   Assessment: 35 y/o M to begin heparin for rule out DVT/PE in setting of leg swelling and shortness of breath. Pt has markedly elevated Scr and rhabdo with unknown cause. Labs above. If dopplers are negative, discontinue heparin and relevant labs. Heparin level was ordered for 1200, however, lab collected at 0700. New heparin level ordered for 1200 early this morning and has not been collected yet. Heparin level was therapeutic at 0.35. CBC stable and no signs of bleeding. Continue current rate until dopplers result and/or dose per heparin level.  Goal of Therapy:  Heparin level 0.3-0.7 units/ml Monitor platelets by anticoagulation protocol: Yes   Plan:  Continue heparin drip at 1400 units/hr Follow-up on 1200 Heparin level Daily CBC/Heparin level Monitor for bleeding   31, PharmD PGY1 Pharmacy Resident  Please check AMION for all [facility] pharmacy phone numbers After 10:00 PM call main pharmacy [phone]

## 2021-02-07 NOTE — H&P (Addendum)
History and Physical    Erik Griffith [id number] DOB: Jun 27, 1985 DOA: 02/06/2021  PCP: Patient, No Pcp Per (Inactive)   Patient coming from: Home  Chief Complaint:    Weakness. Cannot ambulate  HPI: Erik Griffith is a 35 y.o. male with no significant past medical history who presented to [facility] emergency room with complaints of numbness and weakness of his legs for 10 days.  He had not been able to move his legs or ambulate on his own and symptoms of progressively worsened over the last 10 days.  Had to have assistance from family members and to use crutches if he tried to stand up.  He was so weak it would take him a few minutes to just sit up from a laying position and then they would take him a few more minutes to attempt to stand up.  He has not been able to stand up without significant assistance.  With symptoms started a and 14 days ago he had some nasal congestion and a mild dry cough.  He then developed nausea vomiting and diarrhea.  He developed very dark brown-colored urine that he states is now improved.  He did have decreased urinary output for a few days.  He had decreased appetite.  He has been having intermittent nosebleeds for the last week which is not typical for him.  He reports he feels weak especially in his legs.  He is reports that the numbness and weakness started in his feet and gradually moved up his legs to above his waist.  He reports having significant shortness of breath with even minimal exertion of trying to roll over or sit up.  He is having some swelling of his legs which is not typical for him.  Reports he has not had any fevers.  No known sick contacts.  Denies any change in his taste or smell. Lives with his wife and 3 daughters.  He smokes less than a pack of cigarettes a day.  Denies alcohol or illicit drug use.  Reports he does not take medications on a regular basis.  He has used Tylenol intermittently for pain in his back for the last 1 to 2 weeks.  ED  Course: Mr. Louks was found emergency room to have a significant elevated blood pressure.  He was found to have acute renal failure with ATN with a creatinine of 23.58 and a BUN of 199.  Has low calcium of 6.3 and was given calcium supplementation in the emergency room.  Has hyponatremia with a sodium of 127 potassium 4.2 chloride 89 bicarb 10 glucose 132 magnesium 2.2 alkaline phosphatase 47 AST 42 ALT 140 albumin 3.5 bilirubin 0.3.  CK level 12,351.  Troponin 155 and repeat troponin 150.  Patient has no chest pain.  CBC shows WBC of 10,300 hemoglobin 11.4 hematocrit 31.6 platelet 397,000 TSH 1.74.  COVID-negative.  Influenza A and B negative.  Renal ultrasound was performed and showed prominent kidneys with slightly echogenic cortex but no hydronephrosis.  Nephrology was consulted and will see patient in the morning and did not feel the patient needed emergent dialysis overnight.  Hospitalist service asked to admit for further management  Review of Systems:  General: Denies  fever, chills, weight loss, night sweats. Denies dizziness. Denies change in appetite HENT: Denies head trauma, headache, denies change in hearing, tinnitus. Reports mild nasal congestion and bleeding.  Denies sore throat, sores in mouth.  Denies difficulty swallowing Eyes: Denies blurry vision, pain in eye, drainage.  Denies discoloration  of eyes. Neck: Denies pain.  Denies swelling.  Denies pain with movement. Cardiovascular: Denies chest pain, palpitations.  Denies edema.  Denies orthopnea Respiratory: Has mild dry cough. Denies shortness of breath, Denies wheezing.  Denies sputum production Gastrointestinal: Denies abdominal pain, swelling.  Denies nausea, vomiting, diarrhea.  Denies melena.  Denies hematemesis. Musculoskeletal: Reports limitation of movement of legs  Denies deformity. Denies arthralgias or myalgias. Genitourinary: Denies pelvic pain.  Denies urinary frequency or hesitancy.  Denies dysuria.  Skin: Denies rash.   Denies petechiae, purpura, ecchymosis. Neurological: Denies headache.  Denies syncope.  Denies seizure activity. Denies slurred speech, drooping face.  Denies visual change. Psychiatric: Denies depression, anxiety. Denies hallucinations.  History reviewed. No pertinent past medical history.  Past Surgical History:  Procedure Laterality Date   APPENDECTOMY     FLEXOR TENDON REPAIR Left 03/07/2019   Procedure: LEFT THUMB FLEXOR TENDON REPAIR;  Surgeon: Mack Hook, MD;  Location: [facility];  Service: Orthopedics;  Laterality: Left;   NERVE REPAIR Left 03/07/2019   Procedure: LEFT HAND NERVE REPAIR(S);  Surgeon: Mack Hook, MD;  Location: [facility];  Service: Orthopedics;  Laterality: Left;    Social History  reports that he has been smoking cigarettes. He has been smoking an average of .25 packs per day. He has never used smokeless tobacco. He reports current alcohol use. He reports current drug use. Drug: Marijuana.  No Known Allergies  History reviewed. No pertinent family history.   Prior to Admission medications   Medication Sig Start Date End Date Taking? Authorizing Provider  acetaminophen (TYLENOL) 325 MG tablet Take 2 tablets (650 mg total) by mouth every 6 (six) hours. Patient not taking: Reported on 05/02/2019 03/07/19   Mack Hook, MD  ibuprofen (ADVIL) 600 MG tablet Take 1 tablet (600 mg total) by mouth every 6 (six) hours. Patient not taking: Reported on 05/02/2019 03/07/19   Mack Hook, MD  oxyCODONE (OXY IR/ROXICODONE) 5 MG immediate release tablet Take 1 tablet (5 mg total) by mouth every 6 (six) hours as needed (severe breakthru postop pain). Patient not taking: Reported on 05/02/2019 03/07/19   Mack Hook, MD    Physical Exam: Vitals:   02/06/21 2245 02/06/21 2300 02/06/21 2330 02/07/21 0122  BP: (!) 174/102 (!) 168/135 (!) 185/112 (!) 190/108  Pulse: 99 (!) 103 98 98  Resp: (!) 24 18 (!) 22 (!) 29  Temp:    98.2  F (36.8 C)  TempSrc:    Oral  SpO2: 99% 97% 98% 97%  Weight:      Height:        Constitutional: NAD, calm, comfortable Vitals:   02/06/21 2245 02/06/21 2300 02/06/21 2330 02/07/21 0122  BP: (!) 174/102 (!) 168/135 (!) 185/112 (!) 190/108  Pulse: 99 (!) 103 98 98  Resp: (!) 24 18 (!) 22 (!) 29  Temp:    98.2 F (36.8 C)  TempSrc:    Oral  SpO2: 99% 97% 98% 97%  Weight:      Height:       General: WDWN, Alert and oriented x3.  Eyes: EOMI, PERRL, conjunctivae normal. Sclera nonicteric HENT:  [place]/AT, external ears normal.  Nares patent without epistasis.  Mucous membranes are moist. Posterior pharynx clear of any exudate. Normal dentition.  Neck: Soft, normal range of motion, supple, no masses, no thyromegaly. Trachea midline Respiratory: clear to auscultation bilaterally, no wheezing, no crackles. Normal respiratory effort. No accessory muscle use.  Cardiovascular: Regular rate and rhythm, no murmurs /  rubs / gallops. +1 lower extremity edema bilaterally. 2+ pedal pulses.  Abdomen: Soft, no tenderness, nondistended, no rebound or guarding.  No masses palpated. Bowel sounds normoactive Musculoskeletal:  FROM of upper extremities. Does not move the lower extremities. No cyanosis. No joint deformity upper and lower extremities. Normal muscle tone in upper extremities, decreased tone in lower extremities. Legs painful to touch.   Skin: Warm, dry, intact no rashes, lesions, ulcers. No induration Neurologic: CN 2-12 grossly intact.  Normal speech. Sensation decreased in legs. Strength 4/5 upper extremities, strength 1/5 in lower extremities.   Psychiatric: Normal judgment and insight. Normal mood.   Labs on Admission: I have personally reviewed following labs and imaging studies  CBC: Recent Labs  Lab 02/06/21 1932 02/06/21 2005  WBC 10.3  --   NEUTROABS  --  7.9*  HGB 11.4*  --   HCT 31.6*  --   MCV 81.9  --   PLT 397  --     Basic Metabolic Panel: Recent Labs  Lab  02/06/21 1932 02/06/21 2005  NA 127*  --   K 4.2  --   CL 89*  --   CO2 10*  --   GLUCOSE 132*  --   BUN 199*  --   CREATININE 23.58*  --   CALCIUM 6.3*  --   MG  --  2.2    GFR: Estimated Creatinine Clearance: 5.3 mL/min (A) (by C-G formula based on SCr of 23.58 mg/dL (H)).  Liver Function Tests: Recent Labs  Lab 02/06/21 2005  AST 42*  ALT 140*  ALKPHOS 47  BILITOT 0.3  PROT 6.4*  ALBUMIN 3.5    Urine analysis:    Component Value Date/Time   COLORURINE YELLOW 04/11/2018 2244   APPEARANCEUR CLEAR 04/11/2018 2244   LABSPEC 1.031 (H) 04/11/2018 2244   PHURINE 6.0 04/11/2018 2244   GLUCOSEU NEGATIVE 04/11/2018 2244   HGBUR SMALL (A) 04/11/2018 2244   BILIRUBINUR NEGATIVE 04/11/2018 2244   KETONESUR 80 (A) 04/11/2018 2244   PROTEINUR 30 (A) 04/11/2018 2244   NITRITE NEGATIVE 04/11/2018 2244   LEUKOCYTESUR NEGATIVE 04/11/2018 2244    Radiological Exams on Admission: DG Chest 2 View  Result Date: 02/06/2021 CLINICAL DATA:  Shortness of breath. Ten day history of nausea, vomiting, diarrhea, chest pain, and shortness of breath. EXAM: CHEST - 2 VIEW COMPARISON:  None. FINDINGS: The heart size and mediastinal contours are within normal limits. Both lungs are clear. The visualized skeletal structures are unremarkable. IMPRESSION: No active cardiopulmonary disease. Electronically Signed   By: Lucienne Capers M.D.   On: 02/06/2021 20:02   US Renal  Result Date: 02/06/2021 CLINICAL DATA:  Renal failure EXAM: RENAL / URINARY TRACT ULTRASOUND COMPLETE COMPARISON:  None. FINDINGS: Right Kidney: Renal measurements: 13.9 x 7 x 7.5 cm = volume: 380 mL. Cortex appears slightly echogenic. No mass or hydronephrosis. Left Kidney: Renal measurements: 12.8 x 7.9 x 6.5 cm = volume: 343 mL. Cortex appears slightly echogenic. No hydronephrosis. Suspect focally prominent cortical tissue/possible column of Bertin at the mid kidney. Bladder: Appears normal for degree of bladder distention.  Other: Incidental right pleural effusion. IMPRESSION: Kidneys prominent in size. Cortex slightly echogenic consistent with medical renal disease. No hydronephrosis. Electronically Signed   By: Donavan Foil M.D.   On: 02/06/2021 21:35    EKG: Independently reviewed. EKG shows NSR. No acute ST elevation or depression. Qtc is 460  Assessment/Plan Principal Problem:   Acute kidney injury (AKI) with acute tubular necrosis (ATN)  Mr. Pinta admitted to Medical Telemetry floor.  Is on Sodium bicarbonate infusion  Monitor Renal function, electrolytes and CK level.  Nephrology consulted and will evaluate in am. According to report nephrology did not feel patient needed emergent dialysis tonight Renal US done in ER.   Active Problems:   Non-traumatic rhabdomyolysis Lipid infusion with sodium bicarb as above.  Monitor CK levels and electrolytes and renal function.     Metabolic Acidosis Sodium bicarb infusion to 250 ml/hr     Weakness generalized PT to evaluate in the morning.  Fall precautions. Neurology consulted with patient having significant weakness of his legs to the point where he is not able to ambulate.  Concern would be is that is from just rhabdomyolysis or could he have another etiologies such as Ethelene Hal syndrome contributing.  He did have upper respiratory symptoms start of his symptoms of weakness and paresthesia.  This and paresthesias started in his feet and progressed up his legs and now he reports he even has shortness of breath with minimal exertion and just laying down he is breathing faster and working more than normal per him and his wife.    Hypertension Monitor blood pressure.  Patient be given IV metoprolol for systolic blood pressure greater than 165.    Hypocalcemia Was given supplemental calcium in the emergency room.  Recheck calcium in the morning    Paresthesia Patient with tingling and numbness in his feet and legs initially that ascended up his legs.   Neurology to evaluate. MRI of the brain has been ordered for further evaluation to make sure there is no central  nervous system insult     SOB Placed on heparin infusion as cannot obtain CTA chest to rule out PE with renal function. Pt with SOB and has swelling of legs and legs painful to touch or move. Has not been ambulating or moving legs for over 10 days so at risk of DVT. Venous doppler US of legs ordered.      Elevated Troponin Pt with elevated troponin which is probably secondary to renal dysfunction and lack of clearance. Pt has no chest pain and no EKG changes consistent with ischemia.   DVT prophylaxis: Placed on Heparin for anticoagulation with not ambulating for past 10 days and has swelling of legs and pain in legs  Code Status:   Full Code  Family Communication:  Diagnosis and plan discussed with patient and his wife who is at bedside. They verbalize understanding and agree with plan. Questions answered.  Disposition Plan:   Patient is from:  Home  Anticipated DC to:  Home  Anticipated DC date:  Anticipate two midnight or more stay to treat acute condtion   Consults called:  Nephrology consulted by ER physician;  Neurology, Dr. Cheral Marker, consulted Admission status:  Inpatient   Yevonne Aline Carlyann Placide MD [facility]  How to contact the [facility] Attending or Consulting provider [place] or covering provider during after hours [place], for this patient?   Check the care team in [facility] and look for a) attending/consulting [facility] provider listed and b) the [facility] team listed Log into [email or web address] and use [place]'s universal password to access. If you do not have the password, please contact the hospital operator. Locate the [facility] provider you are looking for under [facility] and page to a number that you can be directly reached. If you still have difficulty reaching the provider, please page the [facility] (Director on Call) for the Hospitalists listed  on amion for  assistance.  02/07/2021, 2:03 AM

## 2021-02-07 NOTE — Procedures (Signed)
Vascular and Interventional Radiology Procedure Note  Patient: Erik Griffith DOB: 1985-04-28 Medical Record Number: [id number] Note Date/Time: 02/07/21 12:58 PM   Performing Physician: Roanna Banning, MD Assistant(s): None  Diagnosis: ESRD requiring Hemodialysis  Procedure: TUNNELED HEMODIALYSIS CATHETER PLACEMENT  Anesthesia: Conscious Sedation Complications: None Estimated Blood Loss: Minimal Specimens:  None  Findings:  Successful placement of right-sided, 23 cm (tip-to-cuff), tunneled hemodialysis catheter with the tip of the catheter in the proximal right atrium.  Plan: Catheter ready for use.  See detailed procedure note with images in PACS. The patient tolerated the procedure well without incident or complication and was returned to Floor Bed in stable condition.    Roanna Banning, MD [facility] Radiology   Pager. [phone] Clinic. [phone]

## 2021-02-07 NOTE — Progress Notes (Signed)
Lower extremity venous study completed.  Preliminary results relayed to Elgergawy, MD.  See CV Proc for preliminary results report.   Jean Rosenthal, RDMS, RVT

## 2021-02-08 ENCOUNTER — Inpatient Hospital Stay (HOSPITAL_COMMUNITY): Payer: Medicaid Other

## 2021-02-08 LAB — PROTEIN / CREATININE RATIO, URINE
Creatinine, Urine: 100.03 mg/dL
Protein Creatinine Ratio: 0.3 mg/mg{Cre} — ABNORMAL HIGH (ref 0.00–0.15)
Total Protein, Urine: 30 mg/dL

## 2021-02-08 LAB — RENAL FUNCTION PANEL
Albumin: 2.3 g/dL — ABNORMAL LOW (ref 3.5–5.0)
Anion gap: 16 — ABNORMAL HIGH (ref 5–15)
BUN: 148 mg/dL — ABNORMAL HIGH (ref 6–20)
CO2: 18 mmol/L — ABNORMAL LOW (ref 22–32)
Calcium: 6.9 mg/dL — ABNORMAL LOW (ref 8.9–10.3)
Chloride: 97 mmol/L — ABNORMAL LOW (ref 98–111)
Creatinine, Ser: 17.9 mg/dL — ABNORMAL HIGH (ref 0.61–1.24)
GFR, Estimated: 3 mL/min — ABNORMAL LOW (ref >60)
Glucose, Bld: 126 mg/dL — ABNORMAL HIGH (ref 70–99)
Phosphorus: 6.4 mg/dL — ABNORMAL HIGH (ref 2.5–4.6)
Potassium: 3.7 mmol/L (ref 3.5–5.1)
Sodium: 131 mmol/L — ABNORMAL LOW (ref 135–145)

## 2021-02-08 LAB — URINALYSIS, ROUTINE W REFLEX MICROSCOPIC
Bilirubin Urine: NEGATIVE
Glucose, UA: NEGATIVE mg/dL
Ketones, ur: NEGATIVE mg/dL
Leukocytes,Ua: NEGATIVE
Nitrite: NEGATIVE
Protein, ur: 30 mg/dL — AB
Specific Gravity, Urine: 1.009 (ref 1.005–1.030)
pH: 5 (ref 5.0–8.0)

## 2021-02-08 LAB — RAPID URINE DRUG SCREEN, HOSP PERFORMED
Amphetamines: NOT DETECTED
Barbiturates: NOT DETECTED
Benzodiazepines: NOT DETECTED
Cocaine: POSITIVE — AB
Opiates: NOT DETECTED
Tetrahydrocannabinol: POSITIVE — AB

## 2021-02-08 LAB — CBC
HCT: 27.4 % — ABNORMAL LOW (ref 39.0–52.0)
Hemoglobin: 10.1 g/dL — ABNORMAL LOW (ref 13.0–17.0)
MCH: 30 pg (ref 26.0–34.0)
MCHC: 36.9 g/dL — ABNORMAL HIGH (ref 30.0–36.0)
MCV: 81.3 fL (ref 80.0–100.0)
Platelets: 328 10*3/uL (ref 150–400)
RBC: 3.37 MIL/uL — ABNORMAL LOW (ref 4.22–5.81)
RDW: 12.9 % (ref 11.5–15.5)
WBC: 9 10*3/uL (ref 4.0–10.5)
nRBC: 0 % (ref 0.0–0.2)

## 2021-02-08 LAB — C3 COMPLEMENT: C3 Complement: 160 mg/dL (ref 82–167)

## 2021-02-08 LAB — C4 COMPLEMENT: Complement C4, Body Fluid: 23 mg/dL (ref 12–38)

## 2021-02-08 LAB — ANCA TITERS
Atypical P-ANCA titer: <1:20 {titer}
C-ANCA: <1:20 {titer}
P-ANCA: <1:20 {titer}

## 2021-02-08 LAB — PARATHYROID HORMONE, INTACT (NO CA): PTH: 305 pg/mL — ABNORMAL HIGH (ref 15–65)

## 2021-02-08 LAB — ANA W/REFLEX IF POSITIVE: Anti Nuclear Antibody (ANA): NEGATIVE

## 2021-02-08 LAB — HEPATITIS B SURFACE ANTIBODY, QUANTITATIVE: Hep B S AB Quant (Post): >1000 m[IU]/mL (ref >9.9)

## 2021-02-08 LAB — KAPPA/LAMBDA LIGHT CHAINS
Kappa free light chain: 63.9 mg/L — ABNORMAL HIGH (ref 3.3–19.4)
Kappa, lambda light chain ratio: 2.77 — ABNORMAL HIGH (ref 0.26–1.65)
Lambda free light chains: 23.1 mg/L (ref 5.7–26.3)

## 2021-02-08 LAB — PROTIME-INR
INR: 1.2 (ref 0.8–1.2)
Prothrombin Time: 14.7 seconds (ref 11.4–15.2)

## 2021-02-08 LAB — CK: Total CK: 4934 U/L — ABNORMAL HIGH (ref 49–397)

## 2021-02-08 LAB — GLOMERULAR BASEMENT MEMBRANE ANTIBODIES: GBM Ab: <0.2 units (ref 0.0–0.9)

## 2021-02-08 IMAGING — MR MR FEMUR*R* W/O CM
4 of 5 series · 11 of 40 positions shown · non-contrast
Comparison: None.

CLINICAL DATA: Right thigh pain.

EXAM:
MRI OF THE RIGHT FEMUR WITHOUT CONTRAST
TECHNIQUE: Multiplanar, multisequence MR imaging of the right femur was
performed. No intravenous contrast was administered.

[Series 5: T1 · coronal · 4.0mm · 0.49mm/px · 3 of 56 slices shown (1 of 2)]
[im 1/56]
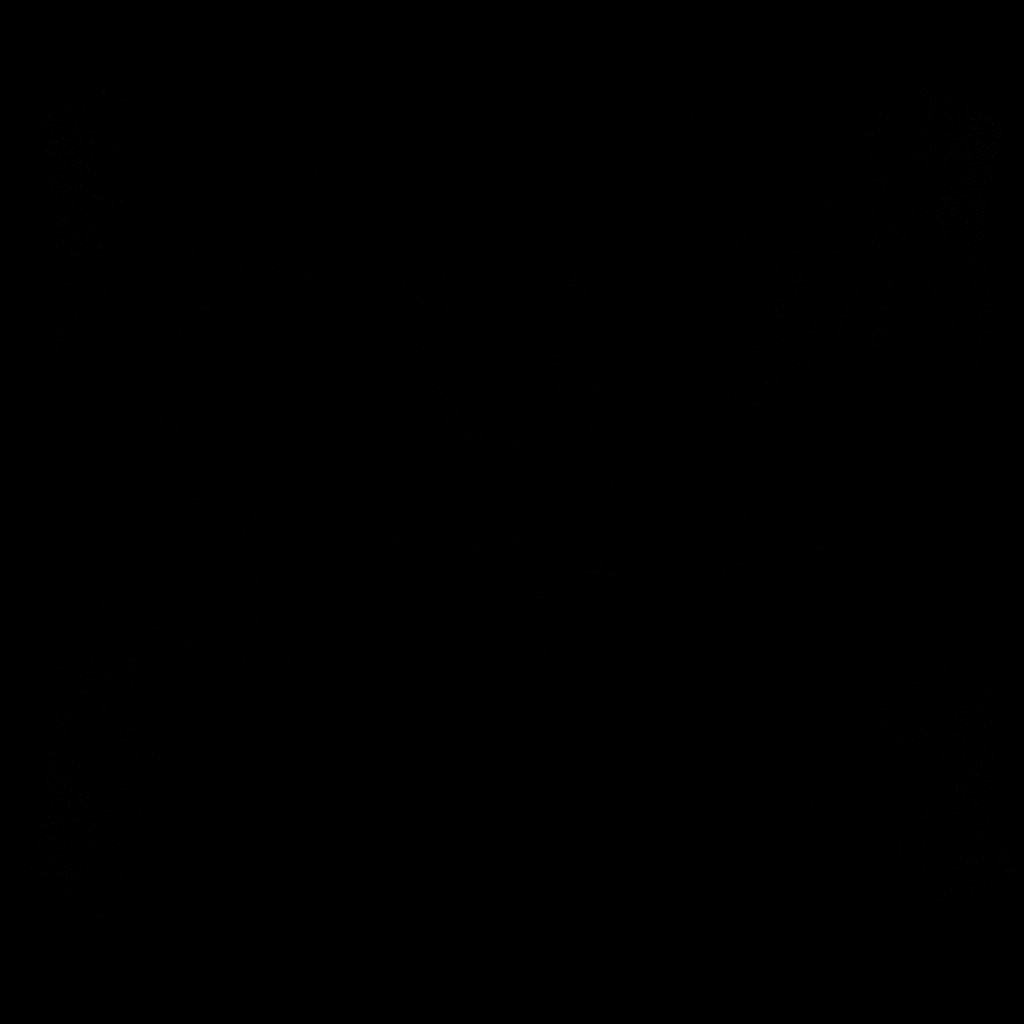
[im 28/56]
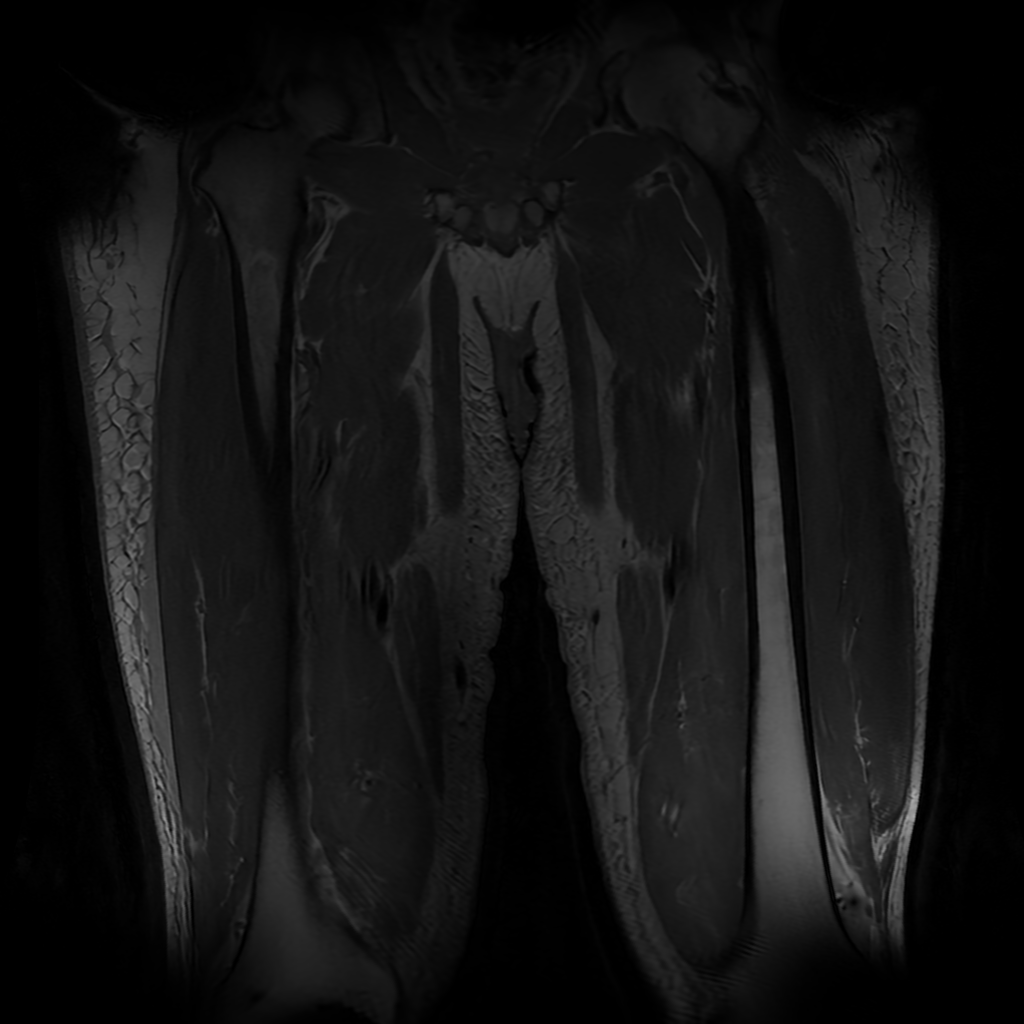
[im 56/56]
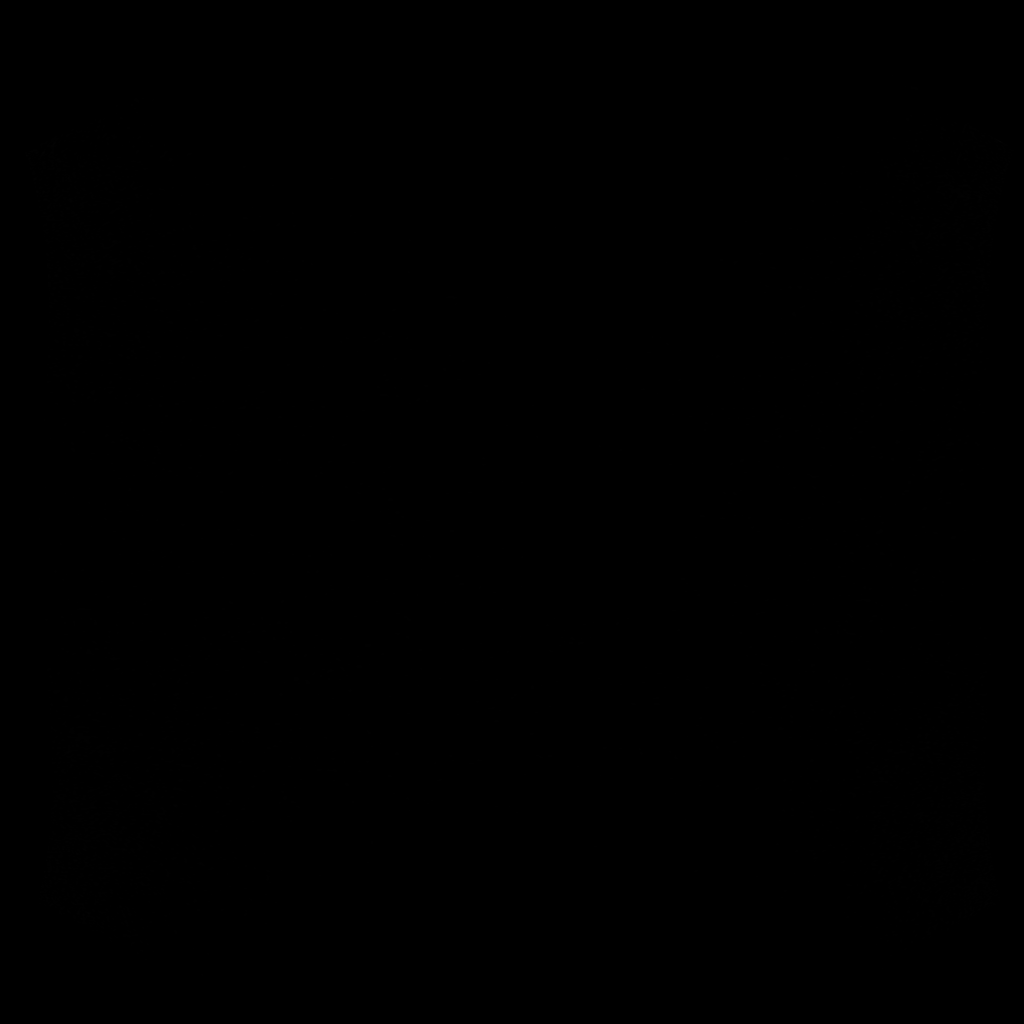

[Series 6: STIR · coronal · 4.0mm · 0.98mm/px · 2 of 56 slices shown]
[im 12/56]
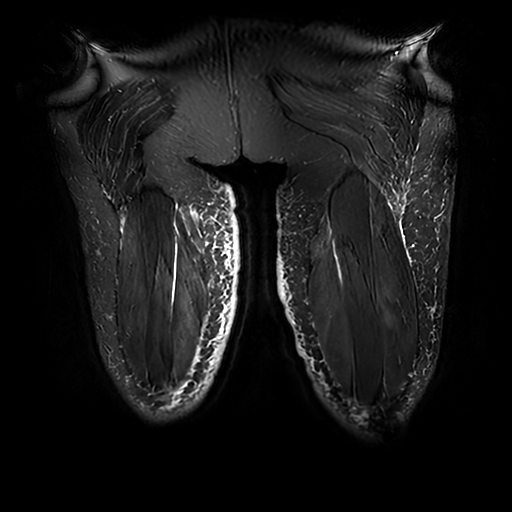
[im 34/56]
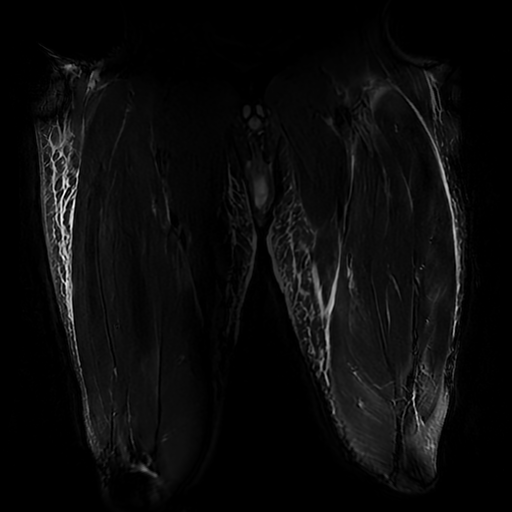

[Series 8: T1 · axial · 4.0mm · 0.55mm/px · z∈[-213,+181]mm · 3 of 109 slices shown (2 of 2)]
[im 20/109]
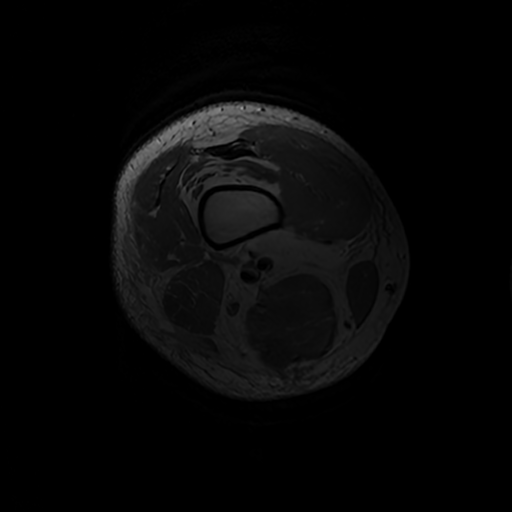
[im 59/109]
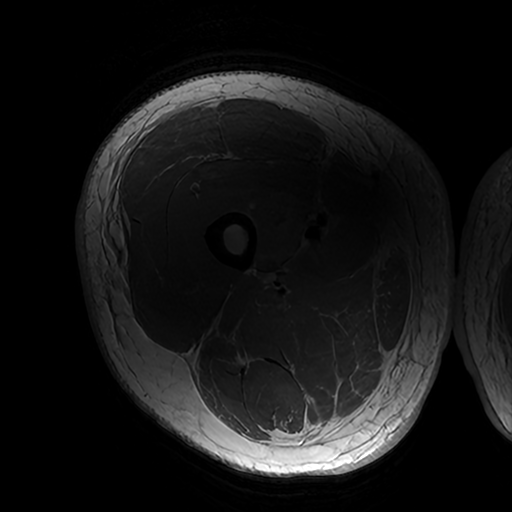
[im 99/109]
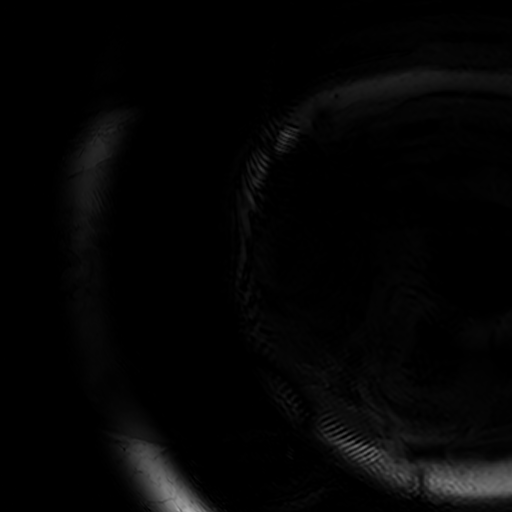

[Series 9: T2 fat-sat · axial · 4.0mm · 0.55mm/px · z∈[-213,+181]mm · 3 of 109 slices shown]
[im 20/109]
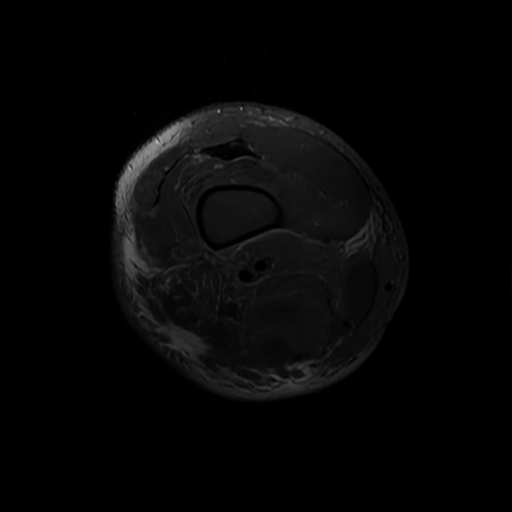
[im 59/109]
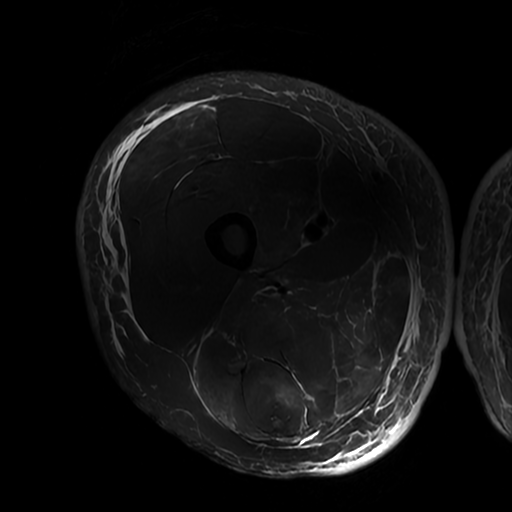
[im 99/109]
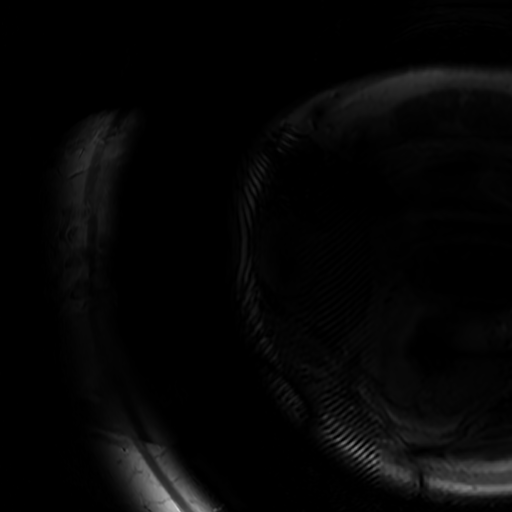

[11 of 40 positions shown; findings below may reference images not displayed]

FINDINGS: Patchy asymmetric edema like signal changes in the thigh musculature
bilaterally slightly more significant on right. This is multi
compartmental but is involving the hamstring musculature more
significantly than the quadriceps musculature. No mass or evidence
of focal fluid collection to suggest pyomyositis. Moderate
interfascial and perifascial fluid is noted along with subcutaneous
soft tissue swelling/edema.

The bony structures are unremarkable.
IMPRESSION: 1. Patchy asymmetric edema like signal changes in the thigh
musculature bilaterally slightly more significant on the right.
Nonspecific myositis could be related to overuse syndrome, post
viral or drug induced. No findings to suggest pyomyositis.
2. Moderate interfascial and perifascial fluid along with
subcutaneous soft tissue swelling/edema.
3. No acute bony findings.

## 2021-02-08 MED ORDER — AMLODIPINE BESYLATE 5 MG PO TABS
5.0000 mg | ORAL_TABLET | Freq: Every day | ORAL | Status: DC
  Administered 2021-02-08 – 2021-02-09 (×2): 5 mg via ORAL
  Filled 2021-02-08 (×2): qty 1

## 2021-02-08 MED ORDER — HEPARIN SODIUM (PORCINE) 1000 UNIT/ML IJ SOLN
INTRAMUSCULAR | Status: AC
  Filled 2021-02-08: qty 4

## 2021-02-08 MED ORDER — HYDRALAZINE HCL 25 MG PO TABS
25.0000 mg | ORAL_TABLET | Freq: Four times a day (QID) | ORAL | Status: DC
  Administered 2021-02-08 – 2021-02-09 (×5): 25 mg via ORAL
  Filled 2021-02-08 (×6): qty 1

## 2021-02-08 NOTE — Plan of Care (Signed)
  Problem: Education: Goal: Knowledge of General Education information will improve Description: Including pain rating scale, medication(s)/side effects and non-pharmacologic comfort measures Outcome: Progressing   Problem: Pain Managment: Goal: General experience of comfort will improve Outcome: Progressing   Problem: Safety: Goal: Ability to remain free from injury will improve Outcome: Progressing   

## 2021-02-08 NOTE — Progress Notes (Signed)
Nephrology Follow-Up Consult note   Assessment/Recommendations: Erik Griffith is a/an 35 y.o. male with a past medical history significant for possible htn, admitted for severe renal failure.     Presumptive Severe Acute Kidney Injury; cannot rule out progressive CKD: normal Crt about 2 years ago but no data since that time.  Some of the symptoms preceded by possible viral illness.  Now w/ severe renal failure w/ some uremia and decreased UOP. Also with severe weakness and nose bleeds. History of UA w/ protein and hematuria.   There have been 2 leading etiologies to his kidney disease.  1 is rhabdomyolysis possibly brought on from a diffuse viral myositis and the other is primary GN. I was thinking GN was more likely but given urine studies and positive urine tox for cocaine I think rhabdo is now more likely.  I am hopeful he will have renal recovery given his UOP is slightly better and urine is more clear but given his PTH is 305 I am worried he may have a fair amount of chronic kidney disease (although kidneys large on RUS).  We will hold off on kidney biopsy for now and reassess on Monday. This will give more time to control BP, decrease bleeding risk, f/u GN labs, and monitor for potential recovery. It may be beneficial to perform a biopsy simply to determine amount of chronicity and chances of recovery.  -Plan for HD 12/30 and 12/31 -appreciate TDC placement by IR -RPR, hepatitis panel, and HIV negative. Hgb a1c 5.9, complement normal -Myositis work up with neurology -RUS with slightly large kidneys (~13cm) and increase cortical echogenicity -Hold on kidney biopsy and reassess need on Monday -Continue to monitor daily Cr, Dose meds for GFR -Monitor Daily I/Os, Daily weight  -Maintain MAP>65 for optimal renal perfusion.  -Avoid nephrotoxic medications including NSAIDs -F/u further GN and urine studies  Hypertension: history of such, unclear control.  Higher at this time.  UF with HD,  clonidine and amlodipine.  Weakness: Work-up per nephrology.  Likely multifactorial.  Profound uremia could definitely be a contributing factor. Likely myositis.  Hyponatremia: Likely related to free water retention in the setting of severe AKI.  Dialysis as above  Anion gap metabolic acidosis: Anion gap largely driven by retention of anions in the setting of severe AKI.  Dialysis as above. Improving.  Hypocalcemia: Corrects to around 7.6. High calcium bath with dialysis.   Recommendations conveyed to primary service.    [facility] 02/08/2021 11:23 AM  ___________________________________________________________  CC: Weakness  Interval History/Subjective: Patient tolerated dialysis yesterday and was seen on HD this morning.  Overall feeling better.  Continues to have some weakness as well as peripheral pain in his muscles; predominantly in the lower extremities.  Urine output slightly better he thinks and not always documented.  We are going to plan on biopsy today but his blood pressure remains fairly high and because of his high risk of bleeding we will hold off for now.  Also, there has been high concern for GN but some labs have returned that make pure rhabdomyolysis more likely.  Urine toxicology was positive for THC as well as cocaine.  UPC only 0.3, no significant RBCs in the urine with moderate hemoglobin on dip.  PTH also returned at 305 making some component of chronic kidney disease possible.  Medications:  Current Facility-Administered Medications  Medication Dose Route Frequency Provider Last Rate Last Admin   heparin sodium (porcine) 1000 UNIT/ML injection  acetaminophen (TYLENOL) tablet 650 mg  650 mg Oral Q6H PRN Chotiner, Claudean Severance, MD       Or   acetaminophen (TYLENOL) suppository 650 mg  650 mg Rectal Q6H PRN Chotiner, Claudean Severance, MD       albuterol (PROVENTIL) (2.5 MG/3ML) 0.083% nebulizer solution 2.5 mg  2.5 mg  Nebulization Q4H PRN Chotiner, Claudean Severance, MD       amLODipine (NORVASC) tablet 5 mg  5 mg Oral Daily Darnell Level, MD       Chlorhexidine Gluconate Cloth 2 % PADS 6 each  6 each Topical Q0600 Annie Sable, MD   6 each at 02/08/21 0747   cloNIDine (CATAPRES) tablet 0.1 mg  0.1 mg Oral TID Darnell Level, MD   0.1 mg at 02/07/21 2138   heparin injection 5,000 Units  5,000 Units Subcutaneous Q8H Elgergawy, Leana Roe, MD       hydrALAZINE (APRESOLINE) injection 5 mg  5 mg Intravenous Q6H PRN Elgergawy, Leana Roe, MD   5 mg at 02/08/21 8366   hydrALAZINE (APRESOLINE) tablet 25 mg  25 mg Oral Q6H Elgergawy, Leana Roe, MD   25 mg at 02/08/21 0845   metoprolol tartrate (LOPRESSOR) injection 5 mg  5 mg Intravenous Q6H PRN Chotiner, Claudean Severance, MD   5 mg at 02/08/21 0353   ondansetron (ZOFRAN) tablet 4 mg  4 mg Oral Q6H PRN Chotiner, Claudean Severance, MD       Or   ondansetron (ZOFRAN) injection 4 mg  4 mg Intravenous Q6H PRN Chotiner, Claudean Severance, MD       oxyCODONE (Oxy IR/ROXICODONE) immediate release tablet 5 mg  5 mg Oral Q6H PRN Chotiner, Claudean Severance, MD   5 mg at 02/08/21 2947   senna-docusate (Senokot-S) tablet 1 tablet  1 tablet Oral QHS PRN Chotiner, Claudean Severance, MD       traZODone (DESYREL) tablet 50 mg  50 mg Oral QHS PRN Chotiner, Claudean Severance, MD   50 mg at 02/08/21 0157      Review of Systems: 10 systems reviewed and negative except per interval history/subjective  Physical Exam: Vitals:   02/08/21 1000 02/08/21 1032  BP: (!) 180/109 (!) 195/110  Pulse: 95 88  Resp: 19 20  Temp:    SpO2:  100%   Total I/O In: -  Out: 1125 [Urine:125; Other:1000]  Intake/Output Summary (Last 24 hours) at 02/08/2021 1123 Last data filed at 02/08/2021 1021 Gross per 24 hour  Intake 542.07 ml  Output 1375 ml  Net -832.93 ml   Constitutional: Ill-appearing, lying in bed, no distress ENMT: ears and nose without scars or lesions, MMM CV: normal rate, trace edema in the bilateral lower  extremities Respiratory: bilateral chest rise with increased work of breathing Gastrointestinal: soft, non-tender, no palpable masses or hernias Skin: no visible lesions or rashes Psych: alert, judgement/insight appropriate, appropriate mood and affect   Test Results I personally reviewed new and old clinical labs and radiology tests Lab Results  Component Value Date   NA 131 (L) 02/08/2021   K 3.7 02/08/2021   CL 97 (L) 02/08/2021   CO2 18 (L) 02/08/2021   BUN 148 (H) 02/08/2021   CREATININE 17.90 (H) 02/08/2021   CALCIUM 6.9 (L) 02/08/2021   ALBUMIN 2.3 (L) 02/08/2021   PHOS 6.4 (H) 02/08/2021

## 2021-02-08 NOTE — Procedures (Signed)
I was present at this dialysis session. I have reviewed the session itself and made appropriate changes.   Filed Weights   02/07/21 2034 02/08/21 0747 02/08/21 1032  Weight: 100.6 kg 109.4 kg 108.4 kg    Recent Labs  Lab 02/08/21 0112  NA 131*  K 3.7  CL 97*  CO2 18*  GLUCOSE 126*  BUN 148*  CREATININE 17.90*  CALCIUM 6.9*  PHOS 6.4*    Recent Labs  Lab 02/06/21 1932 02/06/21 2005 02/07/21 0705 02/08/21 0112  WBC 10.3  --  10.0 9.0  NEUTROABS  --  7.9*  --   --   HGB 11.4*  --  10.8* 10.1*  HCT 31.6*  --  29.7* 27.4*  MCV 81.9  --  81.6 81.3  PLT 397  --  396 328    Scheduled Meds:  heparin sodium (porcine)       amLODipine  5 mg Oral Daily   Chlorhexidine Gluconate Cloth  6 each Topical Q0600   cloNIDine  0.1 mg Oral TID   heparin injection (subcutaneous)  5,000 Units Subcutaneous Q8H   hydrALAZINE  25 mg Oral Q6H   Continuous Infusions: PRN Meds:.acetaminophen **OR** acetaminophen, albuterol, hydrALAZINE, metoprolol tartrate, ondansetron **OR** ondansetron (ZOFRAN) IV, oxyCODONE, senna-docusate, traZODone   Louie Bun,  MD 02/08/2021, 11:32 AM

## 2021-02-08 NOTE — Progress Notes (Signed)
NIF -40cm H20 I.S. 2L+  Good effort

## 2021-02-08 NOTE — Progress Notes (Signed)
°  Transition of Care [facility]) Screening Note   Patient Details  Name: Erik Griffith Date of Birth: 1985/04/23   Transition of Care [facility]) CM/SW Contact:    Mearl Latin, LCSW Phone Number: 02/08/2021, 5:02 PM    Transition of Care Department [facility]) has reviewed patient and no TOC needs have been identified at this time. We will continue to monitor patient advancement through interdisciplinary progression rounds. If new patient transition needs arise, please place a TOC consult.

## 2021-02-08 NOTE — Progress Notes (Signed)
NIF -40 I.S. 2L  Pt had good effort and tolerated well.

## 2021-02-08 NOTE — Progress Notes (Signed)
RT attempted twice to do NIF & VC and pt was not in room. Will attempt at next scheduled time.

## 2021-02-08 NOTE — Progress Notes (Signed)
PT Cancellation Note  Patient Details Name: Erik Griffith MRN: [id number] DOB: [birth year]   Cancelled Treatment:    Reason Eval/Treat Not Completed: Patient at procedure or test/unavailable.  Pt back from HD, now heading to MRI.  Will get to patient as able, likely 12/31. 02/08/2021  Jacinto Halim., PT Acute Rehabilitation Services [phone]  (pager) [phone]  (office)   Eliseo Gum Violet Cart 02/08/2021, 3:57 PM

## 2021-02-08 NOTE — Progress Notes (Signed)
PROGRESS NOTE    Erik Griffith  WER:[id number] DOB: 11/10/1985 DOA: 02/06/2021 PCP: Patient, No Pcp Per (Inactive)    Chief Complaint  Patient presents with   Chest Pain   Nausea    Brief Narrative:   35 year old male without significant past medical history, possible hypertension, admitted for generalized weakness and fatigue, his work-up was significant for severe renal failure, with a creatinine of 23, he is admitted for further work-up.  Assessment & Plan:   Principal Problem:   Acute kidney injury (AKI) with acute tubular necrosis (ATN) (HCC) Active Problems:   Hypocalcemia   Non-traumatic rhabdomyolysis   Hypertension   Weakness generalized   Paresthesia  Severe acute kidney injury -No labs for last 2 years, cannot rule out progressive CKD, but this is unlikely. -Nephrology input greatly appreciated, possibly related to rhabdo induced by cocaine given significantly elevated CK. -Less likely glomerulonephritis due to above, renal biopsy timing per nephrology. -Darted on HD, was dialyzed yesterday and today. -Continue to follow autoimmune work-up,  -PR, hepatitis panel and HIV is negative, hemoglobin A1c is 5.9.  Elevated total CK/rhabdomyolysis -Rhabdomyolysis versus myositis -Most likely rhabdomyolysis cocaine induced. -Follow on how to immune work-up -Neurology input greatly appreciated -Follow Neff and vital capacity every 6 hours per neurology recommendation  Hypertension-uncontrolled, started on clonidine, remains elevated, so added hydralazine and amlodipine.   -Continue with as needed hydralazine.  Marland Kitchen  Hyponatremia -Management with HD  Anion gap metabolic acidosis -Due to AKI, should correct with HD  Hypocalcemia -Management with HD, on high calcium bath  Stopped DC heparin GTT as venous Dopplers negative.    DVT prophylaxis: \[place] heparin  Code Status: Full Family Communication: Discussed with patient.   Disposition:   Status is:  Inpatient  Remains inpatient appropriate because: will start HD       Consultants:  Renal Neurology IR  Procedures:  - [place] placment by IR 12/29   Subjective:  Patient reports he is feeling better today  Objective: Vitals:   02/08/21 1032 02/08/21 1124 02/08/21 1229 02/08/21 1300  BP: (!) 195/110 (!) 192/104 (!) 193/109 (!) 167/91  Pulse: 88 98  (!) 105  Resp: $Remo'20 20  16  'xFLlr$ Temp:      TempSrc: Oral Oral    SpO2: 100%   94%  Weight: 108.4 kg     Height:        Intake/Output Summary (Last 24 hours) at 02/08/2021 1547 Last data filed at 02/08/2021 1230 Gross per 24 hour  Intake 540 ml  Output 1400 ml  Net -860 ml   Filed Weights   02/07/21 2034 02/08/21 0747 02/08/21 1032  Weight: 100.6 kg 109.4 kg 108.4 kg    Examination:  Awake Alert, Oriented X 3, No new F.N deficits, Normal affect Symmetrical Chest wall movement, Good air movement bilaterally, CTAB RRR,No Gallops,Rubs or new Murmurs, No Parasternal Heave +ve B.Sounds, Abd Soft, No tenderness, No rebound - guarding or rigidity. No Cyanosis, Clubbing or edema, No new Rash or bruise       Data Reviewed: I have personally reviewed following labs and imaging studies  CBC: Recent Labs  Lab 02/06/21 1932 02/06/21 2005 02/07/21 0705 02/08/21 0112  WBC 10.3  --  10.0 9.0  NEUTROABS  --  7.9*  --   --   HGB 11.4*  --  10.8* 10.1*  HCT 31.6*  --  29.7* 27.4*  MCV 81.9  --  81.6 81.3  PLT 397  --  396 328  Basic Metabolic Panel: Recent Labs  Lab 02/06/21 1932 02/06/21 2005 02/07/21 0705 02/08/21 0112  NA 127*  --  127* 131*  K 4.2  --  3.7 3.7  CL 89*  --  91* 97*  CO2 10*  --  11* 18*  GLUCOSE 132*  --  105* 126*  BUN 199*  --  209* 148*  CREATININE 23.58*  --  23.31* 17.90*  CALCIUM 6.3*  --  5.8* 6.9*  MG  --  2.2  --   --   PHOS  --   --   --  6.4*    GFR: Estimated Creatinine Clearance: 7.2 mL/min (A) (by C-G formula based on SCr of 17.9 mg/dL (H)).  Liver Function Tests: Recent  Labs  Lab 02/06/21 2005 02/07/21 0705 02/08/21 0112  AST 42* 35  --   ALT 140* 113*  --   ALKPHOS 47 48  --   BILITOT 0.3 0.6  --   PROT 6.4* 5.5*  --   ALBUMIN 3.5 2.7* 2.3*    CBG: No results for input(s): GLUCAP in the last 168 hours.   Recent Results (from the past 240 hour(s))  Resp Panel by RT-PCR (Flu A&B, Covid) Nasopharyngeal Swab     Status: None   Collection Time: 02/06/21  7:33 PM   Specimen: Nasopharyngeal Swab; Nasopharyngeal(NP) swabs in vial transport medium  Result Value Ref Range Status   SARS Coronavirus 2 by RT PCR NEGATIVE NEGATIVE Final    Comment: (NOTE) SARS-CoV-2 target nucleic acids are NOT DETECTED.  The SARS-CoV-2 RNA is generally detectable in upper respiratory specimens during the acute phase of infection. The lowest concentration of SARS-CoV-2 viral copies this assay can detect is 138 copies/mL. A negative result does not preclude SARS-Cov-2 infection and should not be used as the sole basis for treatment or other patient management decisions. A negative result may occur with  improper specimen collection/handling, submission of specimen other than nasopharyngeal swab, presence of viral mutation(s) within the areas targeted by this assay, and inadequate number of viral copies(<138 copies/mL). A negative result must be combined with clinical observations, patient history, and epidemiological information. The expected result is Negative.  Fact Sheet for Patients:  EntrepreneurPulse.com.au  Fact Sheet for Healthcare Providers:  IncredibleEmployment.be  This test is no t yet approved or cleared by the [place] FDA and  has been authorized for detection and/or diagnosis of SARS-CoV-2 by FDA under an Emergency Use Authorization (EUA). This EUA will remain  in effect (meaning this test can be used) for the duration of the COVID-19 declaration under Section 564(b)(1) of the Act, 21 U.S.C.section  360bbb-3(b)(1), unless the authorization is terminated  or revoked sooner.       Influenza A by PCR NEGATIVE NEGATIVE Final   Influenza B by PCR NEGATIVE NEGATIVE Final    Comment: (NOTE) The Xpert Xpress SARS-CoV-2/FLU/RSV plus assay is intended as an aid in the diagnosis of influenza from Nasopharyngeal swab specimens and should not be used as a sole basis for treatment. Nasal washings and aspirates are unacceptable for Xpert Xpress SARS-CoV-2/FLU/RSV testing.  Fact Sheet for Patients: EntrepreneurPulse.com.au  Fact Sheet for Healthcare Providers: IncredibleEmployment.be  This test is not yet approved or cleared by the [place] FDA and has been authorized for detection and/or diagnosis of SARS-CoV-2 by FDA under an Emergency Use Authorization (EUA). This EUA will remain in effect (meaning this test can be used) for the duration of the COVID-19 declaration under Section 564(b)(1) of the  Act, 21 U.S.C. section 360bbb-3(b)(1), unless the authorization is terminated or revoked.  Performed at [facility], [address], [place], [place] [address]          Radiology Studies: DG Chest 2 View  Result Date: 02/06/2021 CLINICAL DATA:  Shortness of breath. Ten day history of nausea, vomiting, diarrhea, chest pain, and shortness of breath. EXAM: CHEST - 2 VIEW COMPARISON:  None. FINDINGS: The heart size and mediastinal contours are within normal limits. Both lungs are clear. The visualized skeletal structures are unremarkable. IMPRESSION: No active cardiopulmonary disease. Electronically Signed   By: Lucienne Capers M.D.   On: 02/06/2021 20:02   MR BRAIN W WO CONTRAST  Result Date: 02/07/2021 CLINICAL DATA:  Motor neuron disease suspected EXAM: MRI HEAD WITHOUT AND WITH CONTRAST TECHNIQUE: Multiplanar, multiecho pulse sequences of the brain and surrounding structures were obtained without and with intravenous contrast.  CONTRAST:  55mL GADAVIST GADOBUTROL 1 MMOL/ML IV SOLN COMPARISON:  None. FINDINGS: Motion limited exam. Brain: No acute infarction, hemorrhage, hydrocephalus, extra-axial collection or mass lesion. Mild scattered scattered T2/FLAIR hyperintensities in the white matter. No abnormal enhancement. Vascular: Major arterial flow voids are maintained at the skull base. Skull and upper cervical spine: Normal marrow signal. Sinuses/Orbits: Largely clear sinuses.  Unremarkable orbits. Other: Mastoid effusions IMPRESSION: 1. No evidence of acute abnormality on this motion limited exam. 2. Mild scattered T2/FLAIR hyperintensities within the white matter, which are nonspecific and not particularly advanced in number for age. Electronically Signed   By: Margaretha Sheffield M.D.   On: 02/07/2021 10:24   US Renal  Result Date: 02/06/2021 CLINICAL DATA:  Renal failure EXAM: RENAL / URINARY TRACT ULTRASOUND COMPLETE COMPARISON:  None. FINDINGS: Right Kidney: Renal measurements: 13.9 x 7 x 7.5 cm = volume: 380 mL. Cortex appears slightly echogenic. No mass or hydronephrosis. Left Kidney: Renal measurements: 12.8 x 7.9 x 6.5 cm = volume: 343 mL. Cortex appears slightly echogenic. No hydronephrosis. Suspect focally prominent cortical tissue/possible column of Bertin at the mid kidney. Bladder: Appears normal for degree of bladder distention. Other: Incidental right pleural effusion. IMPRESSION: Kidneys prominent in size. Cortex slightly echogenic consistent with medical renal disease. No hydronephrosis. Electronically Signed   By: Donavan Foil M.D.   On: 02/06/2021 21:35   IR Fluoro Guide CV Line Right  Result Date: 02/07/2021 INDICATION: ESRD requiring HD. EXAM: TUNNELED CENTRAL VENOUS HEMODIALYSIS CATHETER PLACEMENT WITH ULTRASOUND AND FLUOROSCOPIC GUIDANCE MEDICATIONS: Ancef 2 gm IV . The antibiotic was given in an appropriate time interval prior to skin puncture. ANESTHESIA/SEDATION: Moderate (conscious) sedation was  employed during this procedure. A total of Versed 1 mg and Fentanyl 25 mcg was administered intravenously. Moderate Sedation Time: 16 minutes. The patient's level of consciousness and vital signs were monitored continuously by radiology nursing throughout the procedure under my direct supervision. FLUOROSCOPY TIME:  0 minutes 30 seconds (4 mGy). COMPLICATIONS: None immediate. PROCEDURE: Informed written consent was obtained from the the patient and/or patient's representative after a discussion of the risks, benefits, and alternatives to treatment. Questions regarding the procedure were encouraged and answered. The RIGHT neck and chest were prepped with chlorhexidine in a sterile fashion, and a sterile drape was applied covering the operative field. Maximum barrier sterile technique with sterile gowns and gloves were used for the procedure. A timeout was performed prior to the initiation of the procedure. After creating a small venotomy incision, a micropuncture kit was utilized to access the internal jugular vein. Real-time ultrasound guidance was utilized for  vascular access including the acquisition of a permanent ultrasound image documenting patency of the accessed vessel. The microwire was utilized to measure appropriate catheter length. A stiff Glidewire was advanced to the level of the IVC and the micropuncture sheath was exchanged for a peel-away sheath. A tunneled hemodialysis catheter measuring 23 cm from tip to cuff was tunneled in a retrograde fashion from the anterior chest wall to the venotomy incision. The catheter was then placed through the peel-away sheath with tips ultimately positioned within the superior aspect of the right atrium. Final catheter positioning was confirmed and documented with a spot radiographic image. The catheter aspirates and flushes normally. The catheter was flushed with appropriate volume heparin dwells. The catheter exit site was secured with a 0-Silk retention suture. The  venotomy incision was closed with Dermabond. Dressings were applied. The patient tolerated the procedure well without immediate post procedural complication. IMPRESSION: Successful placement of 23 cm tunneled hemodialysis catheter via the RIGHT internal jugular vein, as above. The catheter is ready for immediate use. Michaelle Birks, MD [facility] Radiology Electronically Signed   By: Michaelle Birks M.D.   On: 02/07/2021 15:05   IR US Guide Vasc Access Right  Result Date: 02/07/2021 INDICATION: ESRD requiring HD. EXAM: TUNNELED CENTRAL VENOUS HEMODIALYSIS CATHETER PLACEMENT WITH ULTRASOUND AND FLUOROSCOPIC GUIDANCE MEDICATIONS: Ancef 2 gm IV . The antibiotic was given in an appropriate time interval prior to skin puncture. ANESTHESIA/SEDATION: Moderate (conscious) sedation was employed during this procedure. A total of Versed 1 mg and Fentanyl 25 mcg was administered intravenously. Moderate Sedation Time: 16 minutes. The patient's level of consciousness and vital signs were monitored continuously by radiology nursing throughout the procedure under my direct supervision. FLUOROSCOPY TIME:  0 minutes 30 seconds (4 mGy). COMPLICATIONS: None immediate. PROCEDURE: Informed written consent was obtained from the the patient and/or patient's representative after a discussion of the risks, benefits, and alternatives to treatment. Questions regarding the procedure were encouraged and answered. The RIGHT neck and chest were prepped with chlorhexidine in a sterile fashion, and a sterile drape was applied covering the operative field. Maximum barrier sterile technique with sterile gowns and gloves were used for the procedure. A timeout was performed prior to the initiation of the procedure. After creating a small venotomy incision, a micropuncture kit was utilized to access the internal jugular vein. Real-time ultrasound guidance was utilized for vascular access including the  acquisition of a permanent ultrasound image documenting patency of the accessed vessel. The microwire was utilized to measure appropriate catheter length. A stiff Glidewire was advanced to the level of the IVC and the micropuncture sheath was exchanged for a peel-away sheath. A tunneled hemodialysis catheter measuring 23 cm from tip to cuff was tunneled in a retrograde fashion from the anterior chest wall to the venotomy incision. The catheter was then placed through the peel-away sheath with tips ultimately positioned within the superior aspect of the right atrium. Final catheter positioning was confirmed and documented with a spot radiographic image. The catheter aspirates and flushes normally. The catheter was flushed with appropriate volume heparin dwells. The catheter exit site was secured with a 0-Silk retention suture. The venotomy incision was closed with Dermabond. Dressings were applied. The patient tolerated the procedure well without immediate post procedural complication. IMPRESSION: Successful placement of 23 cm tunneled hemodialysis catheter via the RIGHT internal jugular vein, as above. The catheter is ready for immediate use. Michaelle Birks, MD [facility] Radiology Electronically Signed  By: Michaelle Birks M.D.   On: 02/07/2021 15:05   VAS [place] LOWER EXTREMITY VENOUS (DVT)  Result Date: 02/07/2021  Lower Venous DVT Study Patient Name:  Erik Griffith  Date of Exam:   02/07/2021 Medical Rec #: [id number]     Accession #:    [id number] Date of Birth: 01-02-1986     Patient Gender: M Patient Age:   37 years Exam Location:  [facility] Procedure:      VAS [place] LOWER EXTREMITY VENOUS (DVT) Referring Phys: Emaad Nanna --------------------------------------------------------------------------------  Indications: Bilateral pain and weakness.  Comparison Study: No prior studies. Performing Technologist: Darlin Coco RDMS, RVT  Examination Guidelines: A  complete evaluation includes B-mode imaging, spectral Doppler, color Doppler, and power Doppler as needed of all accessible portions of each vessel. Bilateral testing is considered an integral part of a complete examination. Limited examinations for reoccurring indications may be performed as noted. The reflux portion of the exam is performed with the patient in reverse Trendelenburg.  +---------+---------------+---------+-----------+----------+--------------+  RIGHT     Compressibility Phasicity Spontaneity Properties Thrombus Aging  +---------+---------------+---------+-----------+----------+--------------+  CFV       Full            Yes       Yes                                    +---------+---------------+---------+-----------+----------+--------------+  SFJ       Full                                                             +---------+---------------+---------+-----------+----------+--------------+  FV Prox   Full                                                             +---------+---------------+---------+-----------+----------+--------------+  FV Mid    Full                                                             +---------+---------------+---------+-----------+----------+--------------+  FV Distal Full                                                             +---------+---------------+---------+-----------+----------+--------------+  PFV       Full                                                             +---------+---------------+---------+-----------+----------+--------------+  POP  Full            Yes       Yes                                    +---------+---------------+---------+-----------+----------+--------------+  PTV       Full                                                             +---------+---------------+---------+-----------+----------+--------------+  PERO      Full                                                              +---------+---------------+---------+-----------+----------+--------------+   +---------+---------------+---------+-----------+----------+--------------+  LEFT      Compressibility Phasicity Spontaneity Properties Thrombus Aging  +---------+---------------+---------+-----------+----------+--------------+  CFV       Full            Yes       Yes                                    +---------+---------------+---------+-----------+----------+--------------+  SFJ       Full                                                             +---------+---------------+---------+-----------+----------+--------------+  FV Prox   Full                                                             +---------+---------------+---------+-----------+----------+--------------+  FV Mid    Full                                                             +---------+---------------+---------+-----------+----------+--------------+  FV Distal Full                                                             +---------+---------------+---------+-----------+----------+--------------+  PFV       Full                                                             +---------+---------------+---------+-----------+----------+--------------+  POP       Full            Yes       Yes                                    +---------+---------------+---------+-----------+----------+--------------+  PTV       Full                                                             +---------+---------------+---------+-----------+----------+--------------+  PERO      Full                                                             +---------+---------------+---------+-----------+----------+--------------+     Summary: RIGHT: - There is no evidence of deep vein thrombosis in the lower extremity.  - No cystic structure found in the popliteal fossa.  LEFT: - There is no evidence of deep vein thrombosis in the lower extremity.  - No cystic structure found in the popliteal fossa.   *See table(s) above for measurements and observations. Electronically signed by Harold Barban MD on 02/07/2021 at 8:03:23 PM.    Final         Scheduled Meds:  amLODipine  5 mg Oral Daily   Chlorhexidine Gluconate Cloth  6 each Topical Q0600   cloNIDine  0.1 mg Oral TID   heparin injection (subcutaneous)  5,000 Units Subcutaneous Q8H   heparin sodium (porcine)       hydrALAZINE  25 mg Oral Q6H   Continuous Infusions:     LOS: 1 day       Phillips Climes, MD [facility]   To contact the attending provider between 7A-7P or the covering provider during after hours 7P-7A, please log into the web site [email or web address] and access using universal [place] password for that web site. If you do not have the password, please call the hospital operator.  02/08/2021, 3:47 PM   Patient ID: Temiloluwa Laredo, male   DOB: August 03, 1985, 35 y.o.   MRN: [id number] Patient ID: Hosam Mcfetridge, male   DOB: 11/03/1985, 35 y.o.   MRN: [id number]

## 2021-02-09 DIAGNOSIS — R202 Paresthesia of skin: Secondary | ICD-10-CM

## 2021-02-09 DIAGNOSIS — R531 Weakness: Secondary | ICD-10-CM

## 2021-02-09 DIAGNOSIS — T44904D Poisoning by unspecified drugs primarily affecting the autonomic nervous system, undetermined, subsequent encounter: Secondary | ICD-10-CM

## 2021-02-09 DIAGNOSIS — F1410 Cocaine abuse, uncomplicated: Secondary | ICD-10-CM

## 2021-02-09 DIAGNOSIS — M60852 Other myositis, left thigh: Secondary | ICD-10-CM

## 2021-02-09 DIAGNOSIS — M60851 Other myositis, right thigh: Secondary | ICD-10-CM

## 2021-02-09 DIAGNOSIS — R29898 Other symptoms and signs involving the musculoskeletal system: Secondary | ICD-10-CM

## 2021-02-09 LAB — RENAL FUNCTION PANEL
Albumin: 2.5 g/dL — ABNORMAL LOW (ref 3.5–5.0)
Anion gap: 16 — ABNORMAL HIGH (ref 5–15)
BUN: 113 mg/dL — ABNORMAL HIGH (ref 6–20)
CO2: 19 mmol/L — ABNORMAL LOW (ref 22–32)
Calcium: 7.7 mg/dL — ABNORMAL LOW (ref 8.9–10.3)
Chloride: 97 mmol/L — ABNORMAL LOW (ref 98–111)
Creatinine, Ser: 14.4 mg/dL — ABNORMAL HIGH (ref 0.61–1.24)
GFR, Estimated: 4 mL/min — ABNORMAL LOW (ref >60)
Glucose, Bld: 115 mg/dL — ABNORMAL HIGH (ref 70–99)
Phosphorus: 7 mg/dL — ABNORMAL HIGH (ref 2.5–4.6)
Potassium: 4.1 mmol/L (ref 3.5–5.1)
Sodium: 132 mmol/L — ABNORMAL LOW (ref 135–145)

## 2021-02-09 LAB — CBC
HCT: 26.6 % — ABNORMAL LOW (ref 39.0–52.0)
Hemoglobin: 9.8 g/dL — ABNORMAL LOW (ref 13.0–17.0)
MCH: 30.7 pg (ref 26.0–34.0)
MCHC: 36.8 g/dL — ABNORMAL HIGH (ref 30.0–36.0)
MCV: 83.4 fL (ref 80.0–100.0)
Platelets: 272 10*3/uL (ref 150–400)
RBC: 3.19 MIL/uL — ABNORMAL LOW (ref 4.22–5.81)
RDW: 13.2 % (ref 11.5–15.5)
WBC: 11 10*3/uL — ABNORMAL HIGH (ref 4.0–10.5)
nRBC: 0 % (ref 0.0–0.2)

## 2021-02-09 LAB — CK: Total CK: 2457 U/L — ABNORMAL HIGH (ref 49–397)

## 2021-02-09 MED ORDER — AMLODIPINE BESYLATE 10 MG PO TABS
10.0000 mg | ORAL_TABLET | Freq: Every day | ORAL | Status: DC
  Administered 2021-02-10 – 2021-02-17 (×7): 10 mg via ORAL
  Filled 2021-02-09 (×8): qty 1

## 2021-02-09 MED ORDER — AMLODIPINE BESYLATE 5 MG PO TABS
5.0000 mg | ORAL_TABLET | Freq: Once | ORAL | Status: AC
  Administered 2021-02-09: 5 mg via ORAL
  Filled 2021-02-09: qty 1

## 2021-02-09 MED ORDER — HYDRALAZINE HCL 50 MG PO TABS
50.0000 mg | ORAL_TABLET | Freq: Four times a day (QID) | ORAL | Status: DC
  Administered 2021-02-09 – 2021-02-16 (×27): 50 mg via ORAL
  Filled 2021-02-09 (×27): qty 1

## 2021-02-09 MED ORDER — OXYCODONE HCL 5 MG PO TABS
5.0000 mg | ORAL_TABLET | Freq: Four times a day (QID) | ORAL | Status: DC | PRN
  Administered 2021-02-09 – 2021-02-16 (×21): 5 mg via ORAL
  Filled 2021-02-09 (×21): qty 1

## 2021-02-09 MED ORDER — HEPARIN SODIUM (PORCINE) 1000 UNIT/ML IJ SOLN
INTRAMUSCULAR | Status: AC
  Administered 2021-02-09: 1000 [IU]
  Filled 2021-02-09: qty 4

## 2021-02-09 NOTE — Progress Notes (Signed)
Pt has been picked up and transported to dialysis.

## 2021-02-09 NOTE — Progress Notes (Signed)
Nephrology Follow-Up Consult note   Assessment/Recommendations: Erik Griffith is a/an 35 y.o. male with a past medical history significant for possible htn, admitted for severe renal failure.     Presumptive Severe Acute Kidney Injury; cannot rule out progressive CKD: normal Crt about 2 years ago but no data since that time.  Some of the symptoms preceded by possible viral illness.  Now w/ severe renal failure w/ some uremia and decreased UOP. Also with severe weakness and nose bleeds. History of UA w/ protein and hematuria.   There have been 2 leading etiologies to his kidney disease.  1 is rhabdomyolysis possibly brought on from a diffuse viral myositis + cocaine and the other is primary GN. I was thinking GN was more likely but given urine studies and positive urine tox for cocaine I think rhabdo is now more likely. Also, GN labs thus far (ANCA, ANA, anti-GBM, etc) have all been negative  I am hopeful he will have renal recovery given his UOP has been slightly better and urine is more clear but given his PTH is 305 I am worried he may have a fair amount of chronic kidney disease (although kidneys large on RUS).  Will plan to hold on dialysis for a couple days to monitor his Crt trend and UOP. Would hold off on kidney biopsy for now. If Crt is not improving a kidney biopsy may still be helpful to rule out definitive GN but also determine level of chronicity to help predict chances of recovery  -appreciate TDC placement by IR -Hold on dialysis until at least Tuesday if tolerated -GN labs negative -Myositis work up with neurology -RUS with slightly large kidneys (~13cm) and increase cortical echogenicity -Hold on kidney biopsy; reassess next week -Continue to monitor daily Cr, Dose meds for GFR -Monitor Daily I/Os, Daily weight  -Maintain MAP>65 for optimal renal perfusion.  -Avoid nephrotoxic medications including NSAIDs  Hypertension: history of such, unclear control.  Higher at this time.   UF with HD, clonidine and amlodipine.  Weakness: Work-up per nephrology.  Likely multifactorial.  Profound uremia could definitely be a contributing factor. Possible myositis; viral? Cocaine?  Hyponatremia: Likely related to free water retention in the setting of severe AKI.  Dialysis as above  Anion gap metabolic acidosis: Anion gap largely driven by retention of anions in the setting of severe AKI.  Dialysis as above. Improving.  Hypocalcemia: Overall improved. High calcium bath with dialysis.   Recommendations conveyed to primary service.    [facility] 02/09/2021 10:02 AM  ___________________________________________________________  CC: Weakness  Interval History/Subjective: Patient states he is starting to feel better.  Continues to have weakness in his legs.  We discussed the cocaine that was found in his urine.  Denies use.  Medications:  Current Facility-Administered Medications  Medication Dose Route Frequency Provider Last Rate Last Admin   acetaminophen (TYLENOL) tablet 650 mg  650 mg Oral Q6H PRN Chotiner, Yevonne Aline, MD       Or   acetaminophen (TYLENOL) suppository 650 mg  650 mg Rectal Q6H PRN Chotiner, Yevonne Aline, MD       albuterol (PROVENTIL) (2.5 MG/3ML) 0.083% nebulizer solution 2.5 mg  2.5 mg Nebulization Q4H PRN Chotiner, Yevonne Aline, MD       amLODipine (NORVASC) tablet 5 mg  5 mg Oral Daily Reesa Chew, MD   5 mg at 02/08/21 1124   Chlorhexidine Gluconate Cloth 2 % PADS 6 each  6 each Topical Q0600 Moshe Cipro,  Lambert Keto, MD   6 each at 02/08/21 0747   cloNIDine (CATAPRES) tablet 0.1 mg  0.1 mg Oral TID Reesa Chew, MD   0.1 mg at 02/08/21 2120   heparin injection 5,000 Units  5,000 Units Subcutaneous Q8H Elgergawy, Silver Huguenin, MD       heparin sodium (porcine) 1000 UNIT/ML injection            hydrALAZINE (APRESOLINE) injection 5 mg  5 mg Intravenous Q6H PRN Elgergawy, Silver Huguenin, MD   5 mg at 02/08/21 4540   hydrALAZINE  (APRESOLINE) tablet 50 mg  50 mg Oral Q6H Elgergawy, Silver Huguenin, MD       metoprolol tartrate (LOPRESSOR) injection 5 mg  5 mg Intravenous Q6H PRN Chotiner, Yevonne Aline, MD   5 mg at 02/08/21 1540   ondansetron (ZOFRAN) tablet 4 mg  4 mg Oral Q6H PRN Chotiner, Yevonne Aline, MD       Or   ondansetron (ZOFRAN) injection 4 mg  4 mg Intravenous Q6H PRN Chotiner, Yevonne Aline, MD       senna-docusate (Senokot-S) tablet 1 tablet  1 tablet Oral QHS PRN Chotiner, Yevonne Aline, MD       traZODone (DESYREL) tablet 50 mg  50 mg Oral QHS PRN Chotiner, Yevonne Aline, MD   50 mg at 02/08/21 0157      Review of Systems: 10 systems reviewed and negative except per interval history/subjective  Physical Exam: Vitals:   02/09/21 0900 02/09/21 0930  BP: (!) 185/102 (!) 173/104  Pulse: 96 92  Resp: 18 16  Temp:    SpO2:     No intake/output data recorded.  Intake/Output Summary (Last 24 hours) at 02/09/2021 1002 Last data filed at 02/09/2021 0636 Gross per 24 hour  Intake 240 ml  Output 1700 ml  Net -1460 ml   Constitutional:  lying in bed, no distress ENMT: ears and nose without scars or lesions, MMM CV: normal rate, trace edema in the bilateral lower extremities Respiratory: bilateral chest rise with increased work of breathing Gastrointestinal: soft, non-tender, no palpable masses or hernias Skin: no visible lesions or rashes Psych: alert, judgement/insight appropriate, appropriate mood and affect   Test Results I personally reviewed new and old clinical labs and radiology tests Lab Results  Component Value Date   NA 132 (L) 02/09/2021   K 4.1 02/09/2021   CL 97 (L) 02/09/2021   CO2 19 (L) 02/09/2021   BUN 113 (H) 02/09/2021   CREATININE 14.40 (H) 02/09/2021   CALCIUM 7.7 (L) 02/09/2021   ALBUMIN 2.5 (L) 02/09/2021   PHOS 7.0 (H) 02/09/2021

## 2021-02-09 NOTE — Progress Notes (Signed)
RT NOTES: Went to do NIF & VC with patient multiple times, pt either at dialysis or asleep. Will check back later.

## 2021-02-09 NOTE — Progress Notes (Signed)
Neurology Progress Note Erik Griffith MR# [id number] 02/09/2021   S: Patient was started on dialysis and today is day 3 and is improving.  O: Current vital signs: BP (!) 183/99 (BP Location: Left Arm)    Pulse (!) 104    Temp 99.6 F (37.6 C) (Oral)    Resp 20    Ht 5\' 11"  (1.803 m)    Wt 106.7 kg    SpO2 95%    BMI 32.81 kg/m  Vital signs in last 24 hours: Temp:  [98 F (36.7 C)-99.6 F (37.6 C)] 99.6 F (37.6 C) (12/31 1157) Pulse Rate:  [25-104] 104 (12/31 1157) Resp:  [14-24] 20 (12/31 1157) BP: (145-185)/(87-114) 183/99 (12/31 1157) SpO2:  [95 %-100 %] 95 % (12/31 1157) Weight:  [106.7 kg-109.8 kg] 106.7 kg (12/31 1055) GENERAL: Awake, alert in NAD HEENT: Normocephalic and atraumatic, moist mm, no LN++, no thyromegaly LUNGS: symmetric excursions bilaterally with no audible wheezes. CV: RR, equal pulses bilaterally. ABDOMEN: Soft, nontender, nondistended with normoactive BS Ext: warm, well perfused, intact peripheral pulses  NEURO:  Mental Status: AA&Ox3  Speech fluent without evidence of aphasia.  Able to follow all commands without difficulty. Good memory and insight.  II: Temporal visual fields intact bilaterally with no extinction to DSS.  PERRL.  III,IV, VI: EOMI without double vision or nystagmus. No ptosis.  V: Temp sensation intact bilaterally VII: Smile symmetric VIII: Hearing intact to voice IX,X: No hypophonia or hoarseness XI: Symmetric shoulder shrug XII: Midline tongue extension RUE 4+/5 proximally and distally LUE 4+/5 proximally and distally RLE 4/5 hip flexion, knee extension, knee flexion, ADF/APF LLE 4/5 hip flexion, knee extension, knee flexion, ADF/APF Temp and light touch intact throughout, bilaterally, including feet and hands.  DTR 2+ bilateral brachioradialis, biceps and triceps, bilateral patellae.   Medications  Current Facility-Administered Medications:    acetaminophen (TYLENOL) tablet 650 mg, 650 mg, Oral, Q6H PRN **OR** acetaminophen  (TYLENOL) suppository 650 mg, 650 mg, Rectal, Q6H PRN, Chotiner, Yevonne Aline, MD   albuterol (PROVENTIL) (2.5 MG/3ML) 0.083% nebulizer solution 2.5 mg, 2.5 mg, Nebulization, Q4H PRN, Chotiner, Yevonne Aline, MD   [START ON 02/10/2021] amLODipine (NORVASC) tablet 10 mg, 10 mg, Oral, Daily, Elgergawy, Silver Huguenin, MD   Chlorhexidine Gluconate Cloth 2 % PADS 6 each, 6 each, Topical, Q0600, Corliss Parish, MD, 6 each at 02/08/21 0747   cloNIDine (CATAPRES) tablet 0.1 mg, 0.1 mg, Oral, TID, Reesa Chew, MD, 0.1 mg at 02/09/21 1630   heparin injection 5,000 Units, 5,000 Units, Subcutaneous, Q8H, Elgergawy, Silver Huguenin, MD   hydrALAZINE (APRESOLINE) injection 5 mg, 5 mg, Intravenous, Q6H PRN, Elgergawy, Silver Huguenin, MD, 5 mg at 02/08/21 [id number]   hydrALAZINE (APRESOLINE) tablet 50 mg, 50 mg, Oral, Q6H, Elgergawy, Silver Huguenin, MD, 50 mg at 02/09/21 1140   metoprolol tartrate (LOPRESSOR) injection 5 mg, 5 mg, Intravenous, Q6H PRN, Chotiner, Yevonne Aline, MD, 5 mg at 02/08/21 1540   ondansetron (ZOFRAN) tablet 4 mg, 4 mg, Oral, Q6H PRN **OR** ondansetron (ZOFRAN) injection 4 mg, 4 mg, Intravenous, Q6H PRN, Chotiner, Yevonne Aline, MD   senna-docusate (Senokot-S) tablet 1 tablet, 1 tablet, Oral, QHS PRN, Chotiner, Yevonne Aline, MD   traZODone (DESYREL) tablet 50 mg, 50 mg, Oral, QHS PRN, Chotiner, Yevonne Aline, MD, 50 mg at 02/08/21 0157 Labs     Component Value Date/Time   WBC 11.0 (H) 02/09/2021 0121   RBC 3.19 (L) 02/09/2021 0121   HGB 9.8 (L) 02/09/2021 0121   HCT 26.6 (L) 02/09/2021 0121  PLT 272 02/09/2021 0121   MCV 83.4 02/09/2021 0121   MCH 30.7 02/09/2021 0121   MCHC 36.8 (H) 02/09/2021 0121   RDW 13.2 02/09/2021 0121   LYMPHSABS 1.2 02/06/2021 2005   MONOABS 1.0 02/06/2021 2005   EOSABS 0.1 02/06/2021 2005   BASOSABS 0.0 02/06/2021 2005       Component Value Date/Time   NA 132 (L) 02/09/2021 0121   K 4.1 02/09/2021 0121   CL 97 (L) 02/09/2021 0121   CO2 19 (L) 02/09/2021 0121   GLUCOSE 115 (H)  02/09/2021 0121   BUN 113 (H) 02/09/2021 0121   CREATININE 14.40 (H) 02/09/2021 0121   CALCIUM 7.7 (L) 02/09/2021 0121   PROT 5.5 (L) 02/07/2021 0705   ALBUMIN 2.5 (L) 02/09/2021 0121   AST 35 02/07/2021 0705   ALT 113 (H) 02/07/2021 0705   ALKPHOS 48 02/07/2021 0705   BILITOT 0.6 02/07/2021 0705   GFRNONAA 4 (L) 02/09/2021 0121   GFRAA >60 02/27/2019 0339   Ca 5.8---->7.7 Na 127--->132   02/08/21  COCAINE POSITIVE !  Tetrahydrocannabinol POSITIVE !   ANA (-)  MRI right LE: Patchy asymmetric edema like signal changes in the thigh musculature bilaterally slightly more significant on the right. Nonspecific myositis could be related to overuse syndrome, post viral or drug induced. No findings to suggest pyomyositis. Moderate interfascial and perifascial fluid along with subcutaneous soft tissue swelling/edema.   Assessment: 35 year old male with focal weakness in bilateral lower extremities associated paresthesias found to be in renal failure with rhabdomyolysis, ATN/AGM, hyponatremia and sever hypocalcemia. MRI showed focal edema in thigh muscles right>left suggesting myositis.  Tox screen showed THC (+) and Cocaine (+) - Sympathomimetic toxicity secondary to cocaine which can cause a focal myositis with significant rhabdomyolysis.  He has since had 3 dialysis sessions and his lower extremity weakness and paresthesias are improving.    Agree with nephrology that weakness is likely multifactorial.  Recommendations: Autoimmune myositis antibody panel may take 1-2 weeks to result. Renal workup/Metabolic derangement workup with nephrology. Please call for questions or if weakness does not continue to improve.   Electronically signed by:  Marisue Humble, MD Page: [phone] 02/09/2021, 4:47 PM

## 2021-02-09 NOTE — Progress Notes (Addendum)
PROGRESS NOTE    Erik Griffith  TOI:[id number] DOB: 11-Feb-1985 DOA: 02/06/2021 PCP: Patient, No Pcp Per (Inactive)    Chief Complaint  Patient presents with   Chest Pain   Nausea    Brief Narrative:   35 year old male without significant past medical history, possible hypertension, admitted for generalized weakness and fatigue, his work-up was significant for severe renal failure, with a creatinine of 23, he is admitted for further work-up.  Assessment & Plan:   Principal Problem:   Acute kidney injury (AKI) with acute tubular necrosis (ATN) (HCC) Active Problems:   Hypocalcemia   Non-traumatic rhabdomyolysis   Hypertension   Weakness generalized   Paresthesia  Severe acute kidney injury -No labs for last 2 years, cannot rule out progressive CKD, but this is unlikely. -Management per renal, possibly related to rhabdomyolysis with diffuse viral myositis plus cocaine versus primary GN, but less likely GN. -Hold on renal biopsy given low probability of GN. -He is on HD, was diffused for last 3 days, plan to watch of dialysis from tomorrow. -Autoimmune work-up so far negative ANCA, ANA, ANCA GBM -Continue to follow autoimmune work-up,  -PR, hepatitis panel and HIV is negative, hemoglobin A1c is 5.9.  Elevated total CK/rhabdomyolysis -Rhabdomyolysis versus myositis -Most likely rhabdomyolysis cocaine induced. -Follow on how to immune work-up -Neurology input greatly appreciated -Follow Neff and vital capacity every 6 hours per neurology recommendation  Hypertension-uncontrolled, started on clonidine, and added amlodipine as well, remains elevated so started on hydralazine . -Continue with as needed hydralazine.  Marland Kitchen  Hyponatremia -Management with HD  Anion gap metabolic acidosis -Due to AKI, should correct with HD  Hypocalcemia -Management with HD, on high calcium bath  Stopped DC heparin GTT as venous Dopplers negative.    DVT prophylaxis: \[place] heparin  Code  Status: Full Family Communication: Discussed with patient.  And wife by phone patient reports he is feeling better today Disposition:   Status is: Inpatient  Remains inpatient appropriate because: will start HD       Consultants:  Renal Neurology IR  Procedures:  - [place] placment by IR 12/29   Subjective:  Patient reports he is feeling better today  Objective: Vitals:   02/09/21 1000 02/09/21 1030 02/09/21 1055 02/09/21 1157  BP: (!) 159/87 (!) 165/101 (!) 175/90 (!) 183/99  Pulse: 92 94 97 (!) 104  Resp: 18 17 16 20   Temp:   98.5 F (36.9 C) 99.6 F (37.6 C)  TempSrc:   Temporal Oral  SpO2:   99% 95%  Weight:   106.7 kg   Height:        Intake/Output Summary (Last 24 hours) at 02/09/2021 1416 Last data filed at 02/09/2021 1055 Gross per 24 hour  Intake --  Output 2550 ml  Net -2550 ml   Filed Weights   02/08/21 1032 02/09/21 0750 02/09/21 1055  Weight: 108.4 kg 109.8 kg 106.7 kg    Examination:   Awake Alert, Oriented X 3, No new F.N deficits, Normal affect Symmetrical Chest wall movement, Good air movement bilaterally, CTAB RRR,No Gallops,Rubs or new Murmurs, No Parasternal Heave +ve B.Sounds, Abd Soft, No tenderness, No rebound - guarding or rigidity. No Cyanosis, Clubbing or edema, No new Rash or bruise      Data Reviewed: I have personally reviewed following labs and imaging studies  CBC: Recent Labs  Lab 02/06/21 1932 02/06/21 2005 02/07/21 0705 02/08/21 0112 02/09/21 0121  WBC 10.3  --  10.0 9.0 11.0*  NEUTROABS  --  7.9*  --   --   --   HGB 11.4*  --  10.8* 10.1* 9.8*  HCT 31.6*  --  29.7* 27.4* 26.6*  MCV 81.9  --  81.6 81.3 83.4  PLT 397  --  396 328 161    Basic Metabolic Panel: Recent Labs  Lab 02/06/21 1932 02/06/21 2005 02/07/21 0705 02/08/21 0112 02/09/21 0121  NA 127*  --  127* 131* 132*  K 4.2  --  3.7 3.7 4.1  CL 89*  --  91* 97* 97*  CO2 10*  --  11* 18* 19*  GLUCOSE 132*  --  105* 126* 115*  BUN 199*  --   209* 148* 113*  CREATININE 23.58*  --  23.31* 17.90* 14.40*  CALCIUM 6.3*  --  5.8* 6.9* 7.7*  MG  --  2.2  --   --   --   PHOS  --   --   --  6.4* 7.0*    GFR: Estimated Creatinine Clearance: 8.9 mL/min (A) (by C-G formula based on SCr of 14.4 mg/dL (H)).  Liver Function Tests: Recent Labs  Lab 02/06/21 2005 02/07/21 0705 02/08/21 0112 02/09/21 0121  AST 42* 35  --   --   ALT 140* 113*  --   --   ALKPHOS 47 48  --   --   BILITOT 0.3 0.6  --   --   PROT 6.4* 5.5*  --   --   ALBUMIN 3.5 2.7* 2.3* 2.5*    CBG: No results for input(s): GLUCAP in the last 168 hours.   Recent Results (from the past 240 hour(s))  Resp Panel by RT-PCR (Flu A&B, Covid) Nasopharyngeal Swab     Status: None   Collection Time: 02/06/21  7:33 PM   Specimen: Nasopharyngeal Swab; Nasopharyngeal(NP) swabs in vial transport medium  Result Value Ref Range Status   SARS Coronavirus 2 by RT PCR NEGATIVE NEGATIVE Final    Comment: (NOTE) SARS-CoV-2 target nucleic acids are NOT DETECTED.  The SARS-CoV-2 RNA is generally detectable in upper respiratory specimens during the acute phase of infection. The lowest concentration of SARS-CoV-2 viral copies this assay can detect is 138 copies/mL. A negative result does not preclude SARS-Cov-2 infection and should not be used as the sole basis for treatment or other patient management decisions. A negative result may occur with  improper specimen collection/handling, submission of specimen other than nasopharyngeal swab, presence of viral mutation(s) within the areas targeted by this assay, and inadequate number of viral copies(<138 copies/mL). A negative result must be combined with clinical observations, patient history, and epidemiological information. The expected result is Negative.  Fact Sheet for Patients:  EntrepreneurPulse.com.au  Fact Sheet for Healthcare Providers:  IncredibleEmployment.be  This test is no t yet  approved or cleared by the [place] FDA and  has been authorized for detection and/or diagnosis of SARS-CoV-2 by FDA under an Emergency Use Authorization (EUA). This EUA will remain  in effect (meaning this test can be used) for the duration of the COVID-19 declaration under Section 564(b)(1) of the Act, 21 U.S.C.section 360bbb-3(b)(1), unless the authorization is terminated  or revoked sooner.       Influenza A by PCR NEGATIVE NEGATIVE Final   Influenza B by PCR NEGATIVE NEGATIVE Final    Comment: (NOTE) The Xpert Xpress SARS-CoV-2/FLU/RSV plus assay is intended as an aid in the diagnosis of influenza from Nasopharyngeal swab specimens and should not be used as a sole basis for  treatment. Nasal washings and aspirates are unacceptable for Xpert Xpress SARS-CoV-2/FLU/RSV testing.  Fact Sheet for Patients: BloggerCourse.com  Fact Sheet for Healthcare Providers: SeriousBroker.it  This test is not yet approved or cleared by the [place] FDA and has been authorized for detection and/or diagnosis of SARS-CoV-2 by FDA under an Emergency Use Authorization (EUA). This EUA will remain in effect (meaning this test can be used) for the duration of the COVID-19 declaration under Section 564(b)(1) of the Act, 21 U.S.C. section 360bbb-3(b)(1), unless the authorization is terminated or revoked.  Performed at [facility], [address] [place], [place] [address]          Radiology Studies: MR [facility] RIGHT WO CONTRAST  Result Date: 02/09/2021 CLINICAL DATA:  Right thigh pain. EXAM: MRI OF THE RIGHT FEMUR WITHOUT CONTRAST TECHNIQUE: Multiplanar, multisequence MR imaging of the right femur was performed. No intravenous contrast was administered. COMPARISON:  None. FINDINGS: Patchy asymmetric edema like signal changes in the thigh musculature bilaterally slightly more significant on right. This is multi  compartmental but is involving the hamstring musculature more significantly than the quadriceps musculature. No mass or evidence of focal fluid collection to suggest pyomyositis. Moderate interfascial and perifascial fluid is noted along with subcutaneous soft tissue swelling/edema. The bony structures are unremarkable. IMPRESSION: 1. Patchy asymmetric edema like signal changes in the thigh musculature bilaterally slightly more significant on the right. Nonspecific myositis could be related to overuse syndrome, post viral or drug induced. No findings to suggest pyomyositis. 2. Moderate interfascial and perifascial fluid along with subcutaneous soft tissue swelling/edema. 3. No acute bony findings. Electronically Signed   By: Rudie Meyer M.D.   On: 02/09/2021 09:32   VAS [place] LOWER EXTREMITY VENOUS (DVT)  Result Date: 02/07/2021  Lower Venous DVT Study Patient Name:  Erik Griffith  Date of Exam:   02/07/2021 Medical Rec #: [id number]     Accession #:    [id number] Date of Birth: 1985-11-30     Patient Gender: M Patient Age:   19 years Exam Location:  [facility] Procedure:      VAS [place] LOWER EXTREMITY VENOUS (DVT) Referring Phys: Karnell Vanderloop --------------------------------------------------------------------------------  Indications: Bilateral pain and weakness.  Comparison Study: No prior studies. Performing Technologist: Jean Rosenthal RDMS, RVT  Examination Guidelines: A complete evaluation includes B-mode imaging, spectral Doppler, color Doppler, and power Doppler as needed of all accessible portions of each vessel. Bilateral testing is considered an integral part of a complete examination. Limited examinations for reoccurring indications may be performed as noted. The reflux portion of the exam is performed with the patient in reverse Trendelenburg.  +---------+---------------+---------+-----------+----------+--------------+  RIGHT     Compressibility Phasicity Spontaneity Properties Thrombus Aging   +---------+---------------+---------+-----------+----------+--------------+  CFV       Full            Yes       Yes                                    +---------+---------------+---------+-----------+----------+--------------+  SFJ       Full                                                             +---------+---------------+---------+-----------+----------+--------------+  FV Prox   Full                                                             +---------+---------------+---------+-----------+----------+--------------+  FV Mid    Full                                                             +---------+---------------+---------+-----------+----------+--------------+  FV Distal Full                                                             +---------+---------------+---------+-----------+----------+--------------+  PFV       Full                                                             +---------+---------------+---------+-----------+----------+--------------+  POP       Full            Yes       Yes                                    +---------+---------------+---------+-----------+----------+--------------+  PTV       Full                                                             +---------+---------------+---------+-----------+----------+--------------+  PERO      Full                                                             +---------+---------------+---------+-----------+----------+--------------+   +---------+---------------+---------+-----------+----------+--------------+  LEFT      Compressibility Phasicity Spontaneity Properties Thrombus Aging  +---------+---------------+---------+-----------+----------+--------------+  CFV       Full            Yes       Yes                                    +---------+---------------+---------+-----------+----------+--------------+  SFJ       Full                                                              +---------+---------------+---------+-----------+----------+--------------+  FV Prox   Full                                                             +---------+---------------+---------+-----------+----------+--------------+  FV Mid    Full                                                             +---------+---------------+---------+-----------+----------+--------------+  FV Distal Full                                                             +---------+---------------+---------+-----------+----------+--------------+  PFV       Full                                                             +---------+---------------+---------+-----------+----------+--------------+  POP       Full            Yes       Yes                                    +---------+---------------+---------+-----------+----------+--------------+  PTV       Full                                                             +---------+---------------+---------+-----------+----------+--------------+  PERO      Full                                                             +---------+---------------+---------+-----------+----------+--------------+     Summary: RIGHT: - There is no evidence of deep vein thrombosis in the lower extremity.  - No cystic structure found in the popliteal fossa.  LEFT: - There is no evidence of deep vein thrombosis in the lower extremity.  - No cystic structure found in the popliteal fossa.  *See table(s) above for measurements and observations. Electronically signed by Harold Barban MD on 02/07/2021 at 8:03:23 PM.    Final         Scheduled Meds:  amLODipine  5 mg Oral Daily   Chlorhexidine Gluconate Cloth  6 each Topical Q0600   cloNIDine  0.1 mg Oral TID   heparin injection (subcutaneous)  5,000 Units Subcutaneous Q8H   hydrALAZINE  50 mg  Oral Q6H   Continuous Infusions:     LOS: 2 days       Phillips Climes, MD [facility]   To contact the attending provider between 7A-7P or  the covering provider during after hours 7P-7A, please log into the web site [email or web address] and access using universal [place] password for that web site. If you do not have the password, please call the hospital operator.  02/09/2021, 2:16 PM   Patient ID: Toan Mort, male   DOB: Sep 03, 1985, 35 y.o.   MRN: [id number] Patient ID: Harlan Ervine, male   DOB: 1986/01/13, 35 y.o.   MRN: [id number] Patient ID: Daveon Arpino, male   DOB: [birth year], 35 y.o.   MRN: [id number]

## 2021-02-09 NOTE — Progress Notes (Addendum)
PT Cancellation Note  Patient Details Name: Erik Griffith MRN: [id number] DOB: 1986/01/29   Cancelled Treatment:    Reason Eval/Treat Not Completed: Patient at procedure or test/unavailable (HD). On second attempt, pt sleeping soundly.  Lillia Pauls, PT, DPT Acute Rehabilitation Services Pager [phone] Office [phone]    Norval Morton 02/09/2021, 7:49 AM

## 2021-02-09 NOTE — Plan of Care (Signed)
Attempted to see patient however he was in dialysis

## 2021-02-09 NOTE — Plan of Care (Signed)
  Problem: Education: Goal: Knowledge of General Education information will improve Description: Including pain rating scale, medication(s)/side effects and non-pharmacologic comfort measures Outcome: Progressing   Problem: Pain Managment: Goal: General experience of comfort will improve Outcome: Progressing   Problem: Safety: Goal: Ability to remain free from injury will improve Outcome: Progressing   

## 2021-02-10 LAB — CBC
HCT: 26.3 % — ABNORMAL LOW (ref 39.0–52.0)
Hemoglobin: 9.4 g/dL — ABNORMAL LOW (ref 13.0–17.0)
MCH: 30.2 pg (ref 26.0–34.0)
MCHC: 35.7 g/dL (ref 30.0–36.0)
MCV: 84.6 fL (ref 80.0–100.0)
Platelets: 243 10*3/uL (ref 150–400)
RBC: 3.11 MIL/uL — ABNORMAL LOW (ref 4.22–5.81)
RDW: 13.5 % (ref 11.5–15.5)
WBC: 9.2 10*3/uL (ref 4.0–10.5)
nRBC: 0 % (ref 0.0–0.2)

## 2021-02-10 LAB — RENAL FUNCTION PANEL
Albumin: 2.6 g/dL — ABNORMAL LOW (ref 3.5–5.0)
Anion gap: 11 (ref 5–15)
BUN: 73 mg/dL — ABNORMAL HIGH (ref 6–20)
CO2: 23 mmol/L (ref 22–32)
Calcium: 8 mg/dL — ABNORMAL LOW (ref 8.9–10.3)
Chloride: 99 mmol/L (ref 98–111)
Creatinine, Ser: 11.12 mg/dL — ABNORMAL HIGH (ref 0.61–1.24)
GFR, Estimated: 6 mL/min — ABNORMAL LOW (ref >60)
Glucose, Bld: 127 mg/dL — ABNORMAL HIGH (ref 70–99)
Phosphorus: 6.4 mg/dL — ABNORMAL HIGH (ref 2.5–4.6)
Potassium: 4 mmol/L (ref 3.5–5.1)
Sodium: 133 mmol/L — ABNORMAL LOW (ref 135–145)

## 2021-02-10 LAB — CK: Total CK: 1071 U/L — ABNORMAL HIGH (ref 49–397)

## 2021-02-10 MED ORDER — CARVEDILOL 6.25 MG PO TABS
6.2500 mg | ORAL_TABLET | Freq: Two times a day (BID) | ORAL | Status: DC
  Administered 2021-02-10 – 2021-02-12 (×6): 6.25 mg via ORAL
  Filled 2021-02-10 (×6): qty 1

## 2021-02-10 NOTE — Plan of Care (Signed)

## 2021-02-10 NOTE — Evaluation (Signed)
Physical Therapy Evaluation Patient Details Name: Erik Griffith MRN: [id number] DOB: 1986-01-06 Today's Date: 02/10/2021  History of Present Illness  Pt is a 36 y.o. M who presents 02/06/2021 with generalized weakness and fatigue, work up significant for severe renal failure, with creatinine of 23. Possibly related to rhabdomyolysis with diffuse viral myositis plus cocaine versus primary GN. No significant PMH.  Clinical Impression  PTA, pt lives with his spouse and 3 children (3, 49, 54 y.o.) in a two story apartment. He works as a Administrator. Pt agreeable to physical therapy session and very motivated. Pt ambulating 150 feet with a walker at a supervision-min guard assist level, HR up to 136 bpm. Presents with BLE weakness, pain and decreased endurance. Suspect steady progress. Will need stair training prior to d/c home.     Recommendations for follow up therapy are one component of a multi-disciplinary discharge planning process, led by the attending physician.  Recommendations may be updated based on patient status, additional functional criteria and insurance authorization.  Follow Up Recommendations No PT follow up    Assistance Recommended at Discharge PRN  Functional Status Assessment Patient has had a recent decline in their functional status and demonstrates the ability to make significant improvements in function in a reasonable and predictable amount of time.  Equipment Recommendations  Rolling walker (2 wheels)    Recommendations for Other Services       Precautions / Restrictions Precautions Precautions: Fall Restrictions Weight Bearing Restrictions: No      Mobility  Bed Mobility Overal bed mobility: Modified Independent                  Transfers Overall transfer level: Needs assistance Equipment used: Rolling walker (2 wheels) Transfers: Sit to/from Stand Sit to Stand: Supervision           General transfer comment: pt powering up from edge of bed,  pushing from walker    Ambulation/Gait Ambulation/Gait assistance: Supervision;Min guard Gait Distance (Feet): 150 Feet Assistive device: Rolling walker (2 wheels) Gait Pattern/deviations: Step-through pattern;Decreased stride length Gait velocity: decreased     General Gait Details: Cues for walker proximity and use. heavy reliance through arms on walker. supervision-min guard for safety. no overt LOB, able to negotiate obstacles. cues for activity pacing and taking rest breaks  Stairs            Wheelchair Mobility    Modified Rankin (Stroke Patients Only)       Balance Overall balance assessment: Needs assistance Sitting-balance support: Feet supported Sitting balance-Leahy Scale: Good     Standing balance support: Bilateral upper extremity supported;Reliant on assistive device for balance Standing balance-Leahy Scale: Poor                               Pertinent Vitals/Pain Pain Assessment: Faces Faces Pain Scale: Hurts little more Pain Location: back Pain Descriptors / Indicators: Aching Pain Intervention(s): Limited activity within patient's tolerance;Monitored during session;Premedicated before session    Home Living Family/patient expects to be discharged to:: Private residence Living Arrangements: Spouse/significant other;Children (11, 6, 3 y.o.) Available Help at Discharge: Family Type of Home: Apartment Home Access: Stairs to enter Entrance Stairs-Rails: Psychiatric nurse of Steps:  (flight)   Home Layout: One level Home Equipment: None      Prior Function Prior Level of Function : Independent/Modified Independent             Mobility Comments: driving  trucks, mostly local       Hand Dominance        Extremity/Trunk Assessment   Upper Extremity Assessment Upper Extremity Assessment: Defer to OT evaluation    Lower Extremity Assessment Lower Extremity Assessment: RLE deficits/detail;LLE  deficits/detail RLE Deficits / Details: Grossly 4+/5, increased edema LLE Deficits / Details: Grossly 4+/5, increased edema    Cervical / Trunk Assessment Cervical / Trunk Assessment: Normal  Communication   Communication: No difficulties  Cognition Arousal/Alertness: Awake/alert Behavior During Therapy: WFL for tasks assessed/performed Overall Cognitive Status: Within Functional Limits for tasks assessed                                          General Comments      Exercises     Assessment/Plan    PT Assessment Patient needs continued PT services  PT Problem List Decreased strength;Decreased activity tolerance;Decreased balance;Decreased mobility;Pain       PT Treatment Interventions DME instruction;Stair training;Gait training;Therapeutic activities;Functional mobility training;Therapeutic exercise;Balance training;Patient/family education    PT Goals (Current goals can be found in the Care Plan section)  Acute Rehab PT Goals Patient Stated Goal: to walk and get back to work PT Goal Formulation: With patient Time For Goal Achievement: 02/24/21 Potential to Achieve Goals: Good    Frequency Min 5X/week   Barriers to discharge        Co-evaluation               AM-PAC PT "6 Clicks" Mobility  Outcome Measure Help needed turning from your back to your side while in a flat bed without using bedrails?: None Help needed moving from lying on your back to sitting on the side of a flat bed without using bedrails?: None Help needed moving to and from a bed to a chair (including a wheelchair)?: A Little Help needed standing up from a chair using your arms (e.g., wheelchair or bedside chair)?: A Little Help needed to walk in hospital room?: A Little Help needed climbing 3-5 steps with a railing? : A Lot 6 Click Score: 19    End of Session Equipment Utilized During Treatment: Gait belt Activity Tolerance: Patient tolerated treatment well Patient  left: in bed;with call bell/phone within reach Nurse Communication: Mobility status PT Visit Diagnosis: Unsteadiness on feet (R26.81);Difficulty in walking, not elsewhere classified (R26.2)    Time: [id number] PT Time Calculation (min) (ACUTE ONLY): 22 min   Charges:   PT Evaluation $PT Eval Low Complexity: [place], PT, DPT Acute Rehabilitation Services Pager [phone] Office [phone]   Erik Griffith 02/10/2021, 12:42 PM

## 2021-02-10 NOTE — Progress Notes (Signed)
TRH night cross cover note:  I was notified by RN of patient's complaint of flank discomfort.  He reportedly has been experiencing intermittent flank discomfort throughout this hospitalization, with pain previously responding well to prn doses of oxycodone.  However, this prior order for prn oxycodone has expired in the interval.  Subsequently, I resumed prior order for prn oxycodone   Newton Pigg, DO Hospitalist

## 2021-02-10 NOTE — Progress Notes (Signed)
OT Cancellation Note  Patient Details Name: Erik Griffith MRN: [id number] DOB: 12/24/1985   Cancelled Treatment:    Reason Eval/Treat Not Completed: Patient declined, no reason specified (Pt agitated with therapist, stating "I know I have been here for a minute," after brief introduction. Pt declining further participation. OT evaluation to f/u as pt and scheulde allow. Pt's RN stating that he is more participatory when wife is present.)  Sherril Shipman A Humaira Sculley 02/10/2021, 3:18 PM

## 2021-02-10 NOTE — Plan of Care (Signed)

## 2021-02-10 NOTE — Plan of Care (Signed)

## 2021-02-10 NOTE — Progress Notes (Signed)
PROGRESS NOTE    Erik Griffith  VVO:[id number] DOB: [birth year] DOA: 02/06/2021 PCP: Patient, No Pcp Per (Inactive)    Chief Complaint  Patient presents with   Chest Pain   Nausea    Brief Narrative:   36 year old male without significant past medical history, possible hypertension, admitted for generalized weakness and fatigue, his work-up was significant for severe renal failure, with a creatinine of 23, he is admitted for further work-up.  Assessment & Plan:   Principal Problem:   Acute kidney injury (AKI) with acute tubular necrosis (ATN) (HCC) Active Problems:   Hypocalcemia   Non-traumatic rhabdomyolysis   Hypertension   Weakness generalized   Paresthesia  Severe acute kidney injury -No labs for last 2 years, cannot rule out progressive CKD, but this is unlikely. -Management per renal, possibly related to rhabdomyolysis with diffuse viral myositis plus cocaine versus primary GN, but less likely GN. -Hold on renal biopsy given low probability of GN. -Autoimmune work-up so far negative ANCA, ANA, ANCA GBM -Continue to follow autoimmune work-up,  -PR, hepatitis panel and HIV is negative, hemoglobin A1c is 5.9. -Management per renal, plan to hold on hemodialysis for now and to watch urine output and renal recovery, and still to hold on renal biopsy for now.  Elevated total CK/rhabdomyolysis -Rhabdomyolysis versus myositis -Most likely rhabdomyolysis cocaine induced. -Follow on how to immune work-up -Neurology input greatly appreciated -Follow Neff and vital capacity every 6 hours per neurology recommendation  Hypertension-uncontrolled, started on clonidine, and added amlodipine as well, remains elevated so started on hydralazine .  I have increased his Norvasc today as well, overall much better controlled, remains on the higher side though, will adjust medication further tomorrow if needed. -Continue with as needed hydralazine.  Marland Kitchen  Hyponatremia -Management with  HD  Anion gap metabolic acidosis -Due to AKI, should correct with HD  Hypocalcemia -Management with HD, on high calcium bath  Stopped DC heparin GTT as venous Dopplers negative.    DVT prophylaxis: [place] heparin  Code Status: Full Family Communication: Discussed with wife at bedside today.    Disposition:   Status is: Inpatient  Remains inpatient appropriate because: will start HD       Consultants:  Renal Neurology IR  Procedures:  - [place] placment by IR 12/29   Subjective:  Reports she is denies any chest pain or shortness of breath, he had some flank pain overnight, currently resolved.  Objective: Vitals:   02/10/21 0039 02/10/21 0400 02/10/21 0736 02/10/21 1153  BP: (!) 162/99 129/74 (!) 160/96   Pulse: 98 (!) 102 (!) 110 (!) 106  Resp: 19 15 19 18   Temp: 99 F (37.2 C)  98.7 F (37.1 C) 98.6 F (37 C)  TempSrc: Oral  Oral Axillary  SpO2: 98% 96% 98% 97%  Weight:      Height:        Intake/Output Summary (Last 24 hours) at 02/10/2021 1259 Last data filed at 02/10/2021 0600 Gross per 24 hour  Intake --  Output 560 ml  Net -560 ml   Filed Weights   02/08/21 1032 02/09/21 0750 02/09/21 1055  Weight: 108.4 kg 109.8 kg 106.7 kg    Examination:   Awake Alert, Oriented X 3, No new F.N deficits, Normal affect Symmetrical Chest wall movement, Good air movement bilaterally, CTAB RRR,No Gallops,Rubs or new Murmurs, No Parasternal Heave +ve B.Sounds, Abd Soft, No tenderness, No rebound - guarding or rigidity. No Cyanosis, Clubbing or edema, No new Rash or bruise  Data Reviewed: I have personally reviewed following labs and imaging studies  CBC: Recent Labs  Lab 02/06/21 1932 02/06/21 2005 02/07/21 0705 02/08/21 0112 02/09/21 0121 02/10/21 0115  WBC 10.3  --  10.0 9.0 11.0* 9.2  NEUTROABS  --  7.9*  --   --   --   --   HGB 11.4*  --  10.8* 10.1* 9.8* 9.4*  HCT 31.6*  --  29.7* 27.4* 26.6* 26.3*  MCV 81.9  --  81.6 81.3 83.4 84.6  PLT 397   --  396 328 272 956    Basic Metabolic Panel: Recent Labs  Lab 02/06/21 1932 02/06/21 2005 02/07/21 0705 02/08/21 0112 02/09/21 0121 02/10/21 0115  NA 127*  --  127* 131* 132* 133*  K 4.2  --  3.7 3.7 4.1 4.0  CL 89*  --  91* 97* 97* 99  CO2 10*  --  11* 18* 19* 23  GLUCOSE 132*  --  105* 126* 115* 127*  BUN 199*  --  209* 148* 113* 73*  CREATININE 23.58*  --  23.31* 17.90* 14.40* 11.12*  CALCIUM 6.3*  --  5.8* 6.9* 7.7* 8.0*  MG  --  2.2  --   --   --   --   PHOS  --   --   --  6.4* 7.0* 6.4*    GFR: Estimated Creatinine Clearance: 11.5 mL/min (A) (by C-G formula based on SCr of 11.12 mg/dL (H)).  Liver Function Tests: Recent Labs  Lab 02/06/21 2005 02/07/21 0705 02/08/21 0112 02/09/21 0121 02/10/21 0115  AST 42* 35  --   --   --   ALT 140* 113*  --   --   --   ALKPHOS 47 48  --   --   --   BILITOT 0.3 0.6  --   --   --   PROT 6.4* 5.5*  --   --   --   ALBUMIN 3.5 2.7* 2.3* 2.5* 2.6*    CBG: No results for input(s): GLUCAP in the last 168 hours.   Recent Results (from the past 240 hour(s))  Resp Panel by RT-PCR (Flu A&B, Covid) Nasopharyngeal Swab     Status: None   Collection Time: 02/06/21  7:33 PM   Specimen: Nasopharyngeal Swab; Nasopharyngeal(NP) swabs in vial transport medium  Result Value Ref Range Status   SARS Coronavirus 2 by RT PCR NEGATIVE NEGATIVE Final    Comment: (NOTE) SARS-CoV-2 target nucleic acids are NOT DETECTED.  The SARS-CoV-2 RNA is generally detectable in upper respiratory specimens during the acute phase of infection. The lowest concentration of SARS-CoV-2 viral copies this assay can detect is 138 copies/mL. A negative result does not preclude SARS-Cov-2 infection and should not be used as the sole basis for treatment or other patient management decisions. A negative result may occur with  improper specimen collection/handling, submission of specimen other than nasopharyngeal swab, presence of viral mutation(s) within  the areas targeted by this assay, and inadequate number of viral copies(<138 copies/mL). A negative result must be combined with clinical observations, patient history, and epidemiological information. The expected result is Negative.  Fact Sheet for Patients:  EntrepreneurPulse.com.au  Fact Sheet for Healthcare Providers:  IncredibleEmployment.be  This test is no t yet approved or cleared by the [place] FDA and  has been authorized for detection and/or diagnosis of SARS-CoV-2 by FDA under an Emergency Use Authorization (EUA). This EUA will remain  in effect (meaning this test can be  used) for the duration of the COVID-19 declaration under Section 564(b)(1) of the Act, 21 U.S.C.section 360bbb-3(b)(1), unless the authorization is terminated  or revoked sooner.       Influenza A by PCR NEGATIVE NEGATIVE Final   Influenza B by PCR NEGATIVE NEGATIVE Final    Comment: (NOTE) The Xpert Xpress SARS-CoV-2/FLU/RSV plus assay is intended as an aid in the diagnosis of influenza from Nasopharyngeal swab specimens and should not be used as a sole basis for treatment. Nasal washings and aspirates are unacceptable for Xpert Xpress SARS-CoV-2/FLU/RSV testing.  Fact Sheet for Patients: EntrepreneurPulse.com.au  Fact Sheet for Healthcare Providers: IncredibleEmployment.be  This test is not yet approved or cleared by the [place] FDA and has been authorized for detection and/or diagnosis of SARS-CoV-2 by FDA under an Emergency Use Authorization (EUA). This EUA will remain in effect (meaning this test can be used) for the duration of the COVID-19 declaration under Section 564(b)(1) of the Act, 21 U.S.C. section 360bbb-3(b)(1), unless the authorization is terminated or revoked.  Performed at [facility], [address], [place], [place] [address]          Radiology Studies: MR [facility]  RIGHT WO CONTRAST  Result Date: 02/09/2021 CLINICAL DATA:  Right thigh pain. EXAM: MRI OF THE RIGHT FEMUR WITHOUT CONTRAST TECHNIQUE: Multiplanar, multisequence MR imaging of the right femur was performed. No intravenous contrast was administered. COMPARISON:  None. FINDINGS: Patchy asymmetric edema like signal changes in the thigh musculature bilaterally slightly more significant on right. This is multi compartmental but is involving the hamstring musculature more significantly than the quadriceps musculature. No mass or evidence of focal fluid collection to suggest pyomyositis. Moderate interfascial and perifascial fluid is noted along with subcutaneous soft tissue swelling/edema. The bony structures are unremarkable. IMPRESSION: 1. Patchy asymmetric edema like signal changes in the thigh musculature bilaterally slightly more significant on the right. Nonspecific myositis could be related to overuse syndrome, post viral or drug induced. No findings to suggest pyomyositis. 2. Moderate interfascial and perifascial fluid along with subcutaneous soft tissue swelling/edema. 3. No acute bony findings. Electronically Signed   By: Marijo Sanes M.D.   On: 02/09/2021 09:32        Scheduled Meds:  amLODipine  10 mg Oral Daily   carvedilol  6.25 mg Oral BID WC   Chlorhexidine Gluconate Cloth  6 each Topical Q0600   heparin injection (subcutaneous)  5,000 Units Subcutaneous Q8H   hydrALAZINE  50 mg Oral Q6H   Continuous Infusions:     LOS: 3 days       Phillips Climes, MD [facility]   To contact the attending provider between 7A-7P or the covering provider during after hours 7P-7A, please log into the web site [email or web address] and access using universal [place] password for that web site. If you do not have the password, please call the hospital operator.  02/10/2021, 12:59 PM   Patient ID: Erik Griffith, male   DOB: [birth year], 36 y.o.   MRN: [id number] Patient ID: Erik Griffith, male    DOB: Jan 24, 1986, 36 y.o.   MRN: [id number] Patient ID: Erik Griffith, male   DOB: [birth year], 36 y.o.   MRN: [id number] Patient ID: Erik Griffith, male   DOB: 1985/11/23, 36 y.o.   MRN: [id number]

## 2021-02-10 NOTE — Progress Notes (Signed)
NIF: -35 VC: 2.4L  With excellent patient effort.

## 2021-02-10 NOTE — Progress Notes (Signed)
Nephrology Follow-Up Consult note   Assessment/Recommendations: Erik Griffith is a/an 36 y.o. male with a past medical history significant for possible htn, admitted for severe renal failure.     Presumptive Severe Acute Kidney Injury; cannot rule out progressive CKD: normal Crt about 2 years ago but no data since that time.  Some of the symptoms preceded by possible viral illness.  Now w/ severe renal failure w/ some uremia and decreased UOP. Also with severe weakness and nose bleeds. History of UA w/ protein and hematuria.   There have been 2 leading etiologies to his kidney disease.  1 is rhabdomyolysis possibly brought on from a diffuse viral myositis + cocaine and the other is primary GN. I was thinking GN was more likely but given urine studies and positive urine tox for cocaine I think rhabdo is now more likely. Also, GN labs thus far (ANCA, ANA, anti-GBM, etc) have all been negative  I am hopeful he will have renal recovery given his UOP has been slightly better and urine is more clear but given his PTH is 305 I am worried he may have a fair amount of chronic kidney disease (although kidneys large on RUS).  Will plan to hold on dialysis for a couple days to monitor his Crt trend and UOP. Would hold off on kidney biopsy for now. If Crt is not improving a kidney biopsy may still be helpful to rule out definitive GN but also determine level of chronicity to help predict chances of recovery  -appreciate TDC placement by IR -Hold on dialysis until at least Tuesday if tolerated -GN labs negative -Myositis work up with neurology -RUS with slightly large kidneys (~13cm) and increase cortical echogenicity -Hold on kidney biopsy; reassess next week -Continue to monitor daily Cr, Dose meds for GFR -Monitor Daily I/Os, Daily weight  -Maintain MAP>65 for optimal renal perfusion.  -Avoid nephrotoxic medications including NSAIDs  Hypertension: history of such, unclear control.  Higher at this time.   UF with HD. Started coreg bid today.  Weakness: Work-up per nephrology.  Likely multifactorial.  Profound uremia could definitely be a contributing factor. Possible myositis; viral? Cocaine? Seems to be improving.  Hyponatremia: Likely related to free water retention in the setting of severe AKI.  Dialysis as above  Anion gap metabolic acidosis: now improved w/ HD.  Hypocalcemia: Overall improved. High calcium bath with dialysis.   Recommendations conveyed to primary service.    [facility] 02/10/2021 10:10 AM  ___________________________________________________________  CC: Weakness  Interval History/Subjective: Patient continues to overall feel better. Urine continues to clear up. BP improved. Some ongoing weakness but overall improved  Medications:  Current Facility-Administered Medications  Medication Dose Route Frequency Provider Last Rate Last Admin   acetaminophen (TYLENOL) tablet 650 mg  650 mg Oral Q6H PRN Chotiner, Yevonne Aline, MD       Or   acetaminophen (TYLENOL) suppository 650 mg  650 mg Rectal Q6H PRN Chotiner, Yevonne Aline, MD       albuterol (PROVENTIL) (2.5 MG/3ML) 0.083% nebulizer solution 2.5 mg  2.5 mg Nebulization Q4H PRN Chotiner, Yevonne Aline, MD       amLODipine (NORVASC) tablet 10 mg  10 mg Oral Daily Elgergawy, Silver Huguenin, MD   10 mg at 02/10/21 0833   carvedilol (COREG) tablet 6.25 mg  6.25 mg Oral BID WC Reesa Chew, MD   6.25 mg at 02/10/21 7858   Chlorhexidine Gluconate Cloth 2 % PADS 6 each  6 each  Topical Q0600 Corliss Parish, MD   6 each at 02/10/21 0612   heparin injection 5,000 Units  5,000 Units Subcutaneous Q8H Elgergawy, Silver Huguenin, MD       hydrALAZINE (APRESOLINE) injection 5 mg  5 mg Intravenous Q6H PRN Elgergawy, Silver Huguenin, MD   5 mg at 02/08/21 4462   hydrALAZINE (APRESOLINE) tablet 50 mg  50 mg Oral Q6H Elgergawy, Silver Huguenin, MD   50 mg at 02/10/21 0606   metoprolol tartrate (LOPRESSOR) injection 5 mg  5 mg  Intravenous Q6H PRN Chotiner, Yevonne Aline, MD   5 mg at 02/08/21 1540   ondansetron (ZOFRAN) tablet 4 mg  4 mg Oral Q6H PRN Chotiner, Yevonne Aline, MD       Or   ondansetron [facility]) injection 4 mg  4 mg Intravenous Q6H PRN Chotiner, Yevonne Aline, MD       oxyCODONE (Oxy IR/ROXICODONE) immediate release tablet 5 mg  5 mg Oral Q6H PRN Howerter, Justin B, DO   5 mg at 02/10/21 8638   senna-docusate (Senokot-S) tablet 1 tablet  1 tablet Oral QHS PRN Chotiner, Yevonne Aline, MD       traZODone (DESYREL) tablet 50 mg  50 mg Oral QHS PRN Chotiner, Yevonne Aline, MD   50 mg at 02/10/21 0041      Review of Systems: 10 systems reviewed and negative except per interval history/subjective  Physical Exam: Vitals:   02/10/21 0400 02/10/21 0736  BP: 129/74 (!) 160/96  Pulse: (!) 102 (!) 110  Resp: 15 19  Temp:  98.7 F (37.1 C)  SpO2: 96% 98%   No intake/output data recorded.  Intake/Output Summary (Last 24 hours) at 02/10/2021 1010 Last data filed at 02/10/2021 0600 Gross per 24 hour  Intake --  Output 2560 ml  Net -2560 ml   Constitutional:  lying in bed, no distress ENMT: ears and nose without scars or lesions, MMM CV: normal rate, trace edema in the bilateral lower extremities Respiratory: bilateral chest rise with increased work of breathing Gastrointestinal: soft, non-tender, no palpable masses or hernias Skin: no visible lesions or rashes Psych: alert, judgement/insight appropriate, appropriate mood and affect   Test Results I personally reviewed new and old clinical labs and radiology tests Lab Results  Component Value Date   NA 133 (L) 02/10/2021   K 4.0 02/10/2021   CL 99 02/10/2021   CO2 23 02/10/2021   BUN 73 (H) 02/10/2021   CREATININE 11.12 (H) 02/10/2021   CALCIUM 8.0 (L) 02/10/2021   ALBUMIN 2.6 (L) 02/10/2021   PHOS 6.4 (H) 02/10/2021

## 2021-02-10 NOTE — Progress Notes (Signed)
Patient asleep. Respirations even and unlabored. Call bell within reach.

## 2021-02-11 LAB — RENAL FUNCTION PANEL
Albumin: 2.7 g/dL — ABNORMAL LOW (ref 3.5–5.0)
Anion gap: 16 — ABNORMAL HIGH (ref 5–15)
BUN: 90 mg/dL — ABNORMAL HIGH (ref 6–20)
CO2: 21 mmol/L — ABNORMAL LOW (ref 22–32)
Calcium: 8.3 mg/dL — ABNORMAL LOW (ref 8.9–10.3)
Chloride: 96 mmol/L — ABNORMAL LOW (ref 98–111)
Creatinine, Ser: 12.55 mg/dL — ABNORMAL HIGH (ref 0.61–1.24)
GFR, Estimated: 5 mL/min — ABNORMAL LOW (ref >60)
Glucose, Bld: 130 mg/dL — ABNORMAL HIGH (ref 70–99)
Phosphorus: 9.1 mg/dL — ABNORMAL HIGH (ref 2.5–4.6)
Potassium: 4.3 mmol/L (ref 3.5–5.1)
Sodium: 133 mmol/L — ABNORMAL LOW (ref 135–145)

## 2021-02-11 LAB — CBC
HCT: 26.4 % — ABNORMAL LOW (ref 39.0–52.0)
Hemoglobin: 9 g/dL — ABNORMAL LOW (ref 13.0–17.0)
MCH: 29.6 pg (ref 26.0–34.0)
MCHC: 34.1 g/dL (ref 30.0–36.0)
MCV: 86.8 fL (ref 80.0–100.0)
Platelets: 309 10*3/uL (ref 150–400)
RBC: 3.04 MIL/uL — ABNORMAL LOW (ref 4.22–5.81)
RDW: 13.4 % (ref 11.5–15.5)
WBC: 10.1 10*3/uL (ref 4.0–10.5)
nRBC: 0 % (ref 0.0–0.2)

## 2021-02-11 LAB — CK: Total CK: 650 U/L — ABNORMAL HIGH (ref 49–397)

## 2021-02-11 NOTE — Progress Notes (Signed)
[facility] Progress Note    36 y.o. male with a past medical history significant for possible htn, admitted for severe renal failure.        Assessment/ Plan:   1) Severe Acute Kidney Injury; cannot rule out progressive CKD: normal Crt about 2 years ago but no data since that time.  Some of the symptoms preceded by possible viral illness.  Now w/ severe renal failure w/ some uremia and decreased UOP. Also with severe weakness and nose bleeds. History of UA w/ protein and hematuria.  GN less likely with positive urine tox for cocaine especially with negative ANCA making levamisole induced vasculitis unlikely. Presumptive diagnosis of  rhabdo. (ANCA, ANA, anti-GBM, etc) have all been negative   Still  hopeful he will have renal recovery; last HD 12/31 with 2L net UF.    Will hold on dialysis for another day and possibly HD tomorrow without UF if renal function worse. Holding off on kidney biopsy for now but if cr not improving by later in the week may need a kidney biopsy  to rule out definitive GN +  also determine level of chronicity to help predict chances of recovery   -appreciate TDC placement by IR -Hold on dialysis until at least Tuesday as tolerated; UOP lower than prior 24hr. -GN labs negative -Myositis work up with neurology -RUS with slightly large kidneys (~13cm) and increase cortical echogenicity -Continue to monitor daily Cr, Dose meds for GFR -Monitor Daily I/Os, Daily weight  -Maintain MAP>65 for optimal renal perfusion.  -Avoid nephrotoxic medications including NSAIDs   2) Hypertension: history of such, unclear control.  Higher at this time.  UF with HD. Started coreg bid today.   3) Weakness: Work-up per nephrology.  Likely multifactorial.  Profound uremia could definitely be a contributing factor. Possible myositis; viral? Cocaine? Seems to be improving.   4) Hyponatremia: Likely related to free water retention in the setting of severe AKI.  Dialysis as  above   5) Anion gap metabolic acidosis: now improved w/ HD.   6) Hypocalcemia: Overall improved. High calcium bath with dialysis.  Subjective:   Denies fever, chills, nausea, anorexia or dyspnea.   Objective:   BP (!) 160/107 (BP Location: Left Wrist)    Pulse (!) 103    Temp 98.5 F (36.9 C) (Oral)    Resp 16    Ht 5\' 11"  (1.803 m)    Wt 106.7 kg    SpO2 96%    BMI 32.81 kg/m   Intake/Output Summary (Last 24 hours) at 02/11/2021 7829 Last data filed at 02/11/2021 0630 Gross per 24 hour  Intake --  Output 950 ml  Net -950 ml   Weight change:   Physical Exam: Constitutional:  lying in bed, no distress ENMT: ears and nose without scars or lesions CV: normal rate, trace edema in the bilateral lower extremities Respiratory: bilateral chest rise with increased work of breathing Gastrointestinal: soft, non-tender, no palpable masses or hernias Skin: no visible lesions or rashes Psych: alert, judgement/insight appropriate, appropriate mood and affect Access: RIJ TC   Imaging: No results found.  Labs: BMET Recent Labs  Lab 02/06/21 1932 02/07/21 0705 02/08/21 0112 02/09/21 0121 02/10/21 0115 02/11/21 0246  NA 127* 127* 131* 132* 133* 133*  K 4.2 3.7 3.7 4.1 4.0 4.3  CL 89* 91* 97* 97* 99 96*  CO2 10* 11* 18* 19* 23 21*  GLUCOSE 132* 105* 126* 115* 127* 130*  BUN 199* 209* 148* 113* 73*  90*  CREATININE 23.58* 23.31* 17.90* 14.40* 11.12* 12.55*  CALCIUM 6.3* 5.8* 6.9* 7.7* 8.0* 8.3*  PHOS  --   --  6.4* 7.0* 6.4* 9.1*   CBC Recent Labs  Lab 02/06/21 2005 02/07/21 0705 02/08/21 0112 02/09/21 0121 02/10/21 0115 02/11/21 0246  WBC  --    < > 9.0 11.0* 9.2 10.1  NEUTROABS 7.9*  --   --   --   --   --   HGB  --    < > 10.1* 9.8* 9.4* 9.0*  HCT  --    < > 27.4* 26.6* 26.3* 26.4*  MCV  --    < > 81.3 83.4 84.6 86.8  PLT  --    < > 328 272 243 309   < > = values in this interval not displayed.    Medications:     amLODipine  10 mg Oral Daily   carvedilol  6.25  mg Oral BID WC   Chlorhexidine Gluconate Cloth  6 each Topical Q0600   heparin injection (subcutaneous)  5,000 Units Subcutaneous Q8H   hydrALAZINE  50 mg Oral Q6H      Otelia Santee, MD 02/11/2021, 6:58 AM

## 2021-02-11 NOTE — Progress Notes (Signed)
PROGRESS NOTE    Jamel Holzmann  YNW:[id number] DOB: 03/01/1985 DOA: 02/06/2021 PCP: Patient, No Pcp Per (Inactive)    Chief Complaint  Patient presents with   Chest Pain   Nausea    Brief Narrative:   36 year old male without significant past medical history, possible hypertension, admitted for generalized weakness and fatigue, his work-up was significant for severe renal failure, with a creatinine of 23, he is admitted for further work-up.  Assessment & Plan:   Principal Problem:   Acute kidney injury (AKI) with acute tubular necrosis (ATN) (HCC) Active Problems:   Hypocalcemia   Non-traumatic rhabdomyolysis   Hypertension   Weakness generalized   Paresthesia  Severe acute kidney injury -No labs for last 2 years, cannot rule out progressive CKD, but this is unlikely. -Management per renal, possibly related to rhabdomyolysis with diffuse viral myositis plus cocaine versus primary GN, but less likely GN. -Hold on renal biopsy given low probability of GN. -Autoimmune work-up so far negative ANCA, ANA, ANCA GBM -PR, hepatitis panel and HIV is negative, hemoglobin A1c is 5.9. -Management per renal, most HD was on 12/31, dialysis has been held since, creatinine is trending up, likely will requiring HD tomorrow without UF . -renal is holding on renal biopsy so far, but may be needed later this week to rule out definitive GN . Marland Kitchen  Elevated total CK/rhabdomyolysis -Rhabdomyolysis versus myositis -Most likely rhabdomyolysis cocaine induced. -Follow on how to immune work-up(all pending as of 02/11/2021 ( -Neurology input greatly appreciated  Hypertension-uncontrolled,  - started on clonidine, Norvasc and hydralazine, Coreg was added today.   -Continue with as needed hydralazine.  Marland Kitchen  Hyponatremia -Management with HD  Anion gap metabolic acidosis -Due to AKI, should correct with HD  Hypocalcemia -Management with HD, on high calcium bath  Stopped DC heparin GTT as venous  Dopplers negative.    DVT prophylaxis: [place] heparin  Code Status: Full Family Communication: Discussed with wife at bedside today.    Disposition:   Status is: Inpatient  Remains inpatient appropriate because: On HD       Consultants:  Renal Neurology IR  Procedures:  - [place] placment by IR 12/29   Subjective:  No significant events overnight, denies dyspnea, reports generalized body ache has significantly improved, ambulate with with a walker yesterday in the hallway.  Objective: Vitals:   02/11/21 0922 02/11/21 1027 02/11/21 1216 02/11/21 1340  BP: (!) 169/111 (!) 166/114 (!) 177/111 (!) 165/104  Pulse:   100   Resp:   16   Temp:   98 F (36.7 C)   TempSrc:   Oral   SpO2:   97%   Weight:      Height:        Intake/Output Summary (Last 24 hours) at 02/11/2021 1352 Last data filed at 02/11/2021 1333 Gross per 24 hour  Intake --  Output 1400 ml  Net -1400 ml   Filed Weights   02/08/21 1032 02/09/21 0750 02/09/21 1055  Weight: 108.4 kg 109.8 kg 106.7 kg    Examination:   Awake Alert, Oriented X 3, No new F.N deficits, Normal affect Symmetrical Chest wall movement, Good air movement bilaterally, CTAB RRR,No Gallops,Rubs or new Murmurs, No Parasternal Heave +ve B.Sounds, Abd Soft, No tenderness, No rebound - guarding or rigidity. No Cyanosis, Clubbing or edema, No new Rash or bruise        Data Reviewed: I have personally reviewed following labs and imaging studies  CBC: Recent Labs  Lab 02/06/21 2005  02/07/21 0705 02/08/21 0112 02/09/21 0121 02/10/21 0115 02/11/21 0246  WBC  --  10.0 9.0 11.0* 9.2 10.1  NEUTROABS 7.9*  --   --   --   --   --   HGB  --  10.8* 10.1* 9.8* 9.4* 9.0*  HCT  --  29.7* 27.4* 26.6* 26.3* 26.4*  MCV  --  81.6 81.3 83.4 84.6 86.8  PLT  --  396 328 272 243 332    Basic Metabolic Panel: Recent Labs  Lab 02/06/21 2005 02/07/21 0705 02/08/21 0112 02/09/21 0121 02/10/21 0115 02/11/21 0246  NA  --  127* 131* 132*  133* 133*  K  --  3.7 3.7 4.1 4.0 4.3  CL  --  91* 97* 97* 99 96*  CO2  --  11* 18* 19* 23 21*  GLUCOSE  --  105* 126* 115* 127* 130*  BUN  --  209* 148* 113* 73* 90*  CREATININE  --  23.31* 17.90* 14.40* 11.12* 12.55*  CALCIUM  --  5.8* 6.9* 7.7* 8.0* 8.3*  MG 2.2  --   --   --   --   --   PHOS  --   --  6.4* 7.0* 6.4* 9.1*    GFR: Estimated Creatinine Clearance: 10.2 mL/min (A) (by C-G formula based on SCr of 12.55 mg/dL (H)).  Liver Function Tests: Recent Labs  Lab 02/06/21 2005 02/07/21 0705 02/08/21 0112 02/09/21 0121 02/10/21 0115 02/11/21 0246  AST 42* 35  --   --   --   --   ALT 140* 113*  --   --   --   --   ALKPHOS 47 48  --   --   --   --   BILITOT 0.3 0.6  --   --   --   --   PROT 6.4* 5.5*  --   --   --   --   ALBUMIN 3.5 2.7* 2.3* 2.5* 2.6* 2.7*    CBG: No results for input(s): GLUCAP in the last 168 hours.   Recent Results (from the past 240 hour(s))  Resp Panel by RT-PCR (Flu A&B, Covid) Nasopharyngeal Swab     Status: None   Collection Time: 02/06/21  7:33 PM   Specimen: Nasopharyngeal Swab; Nasopharyngeal(NP) swabs in vial transport medium  Result Value Ref Range Status   SARS Coronavirus 2 by RT PCR NEGATIVE NEGATIVE Final    Comment: (NOTE) SARS-CoV-2 target nucleic acids are NOT DETECTED.  The SARS-CoV-2 RNA is generally detectable in upper respiratory specimens during the acute phase of infection. The lowest concentration of SARS-CoV-2 viral copies this assay can detect is 138 copies/mL. A negative result does not preclude SARS-Cov-2 infection and should not be used as the sole basis for treatment or other patient management decisions. A negative result may occur with  improper specimen collection/handling, submission of specimen other than nasopharyngeal swab, presence of viral mutation(s) within the areas targeted by this assay, and inadequate number of viral copies(<138 copies/mL). A negative result must be combined with clinical  observations, patient history, and epidemiological information. The expected result is Negative.  Fact Sheet for Patients:  EntrepreneurPulse.com.au  Fact Sheet for Healthcare Providers:  IncredibleEmployment.be  This test is no t yet approved or cleared by the [place] FDA and  has been authorized for detection and/or diagnosis of SARS-CoV-2 by FDA under an Emergency Use Authorization (EUA). This EUA will remain  in effect (meaning this test can be used) for the  duration of the COVID-19 declaration under Section 564(b)(1) of the Act, 21 U.S.C.section 360bbb-3(b)(1), unless the authorization is terminated  or revoked sooner.       Influenza A by PCR NEGATIVE NEGATIVE Final   Influenza B by PCR NEGATIVE NEGATIVE Final    Comment: (NOTE) The Xpert Xpress SARS-CoV-2/FLU/RSV plus assay is intended as an aid in the diagnosis of influenza from Nasopharyngeal swab specimens and should not be used as a sole basis for treatment. Nasal washings and aspirates are unacceptable for Xpert Xpress SARS-CoV-2/FLU/RSV testing.  Fact Sheet for Patients: EntrepreneurPulse.com.au  Fact Sheet for Healthcare Providers: IncredibleEmployment.be  This test is not yet approved or cleared by the [place] FDA and has been authorized for detection and/or diagnosis of SARS-CoV-2 by FDA under an Emergency Use Authorization (EUA). This EUA will remain in effect (meaning this test can be used) for the duration of the COVID-19 declaration under Section 564(b)(1) of the Act, 21 U.S.C. section 360bbb-3(b)(1), unless the authorization is terminated or revoked.  Performed at [facility], [address], [place], [place] [address]          Radiology Studies: No results found.      Scheduled Meds:  amLODipine  10 mg Oral Daily   carvedilol  6.25 mg Oral BID WC   Chlorhexidine Gluconate Cloth  6  each Topical Q0600   heparin injection (subcutaneous)  5,000 Units Subcutaneous Q8H   hydrALAZINE  50 mg Oral Q6H   Continuous Infusions:     LOS: 4 days       Phillips Climes, MD [facility]   To contact the attending provider between 7A-7P or the covering provider during after hours 7P-7A, please log into the web site [email or web address] and access using universal [place] password for that web site. If you do not have the password, please call the hospital operator.  02/11/2021, 1:52 PM   Patient ID: Clark Cuff, male   DOB: 12-Nov-1985, 36 y.o.   MRN: [id number] Patient ID: Donavan Kerlin, male   DOB: December 21, 1985, 36 y.o.   MRN: [id number] Patient ID: Andren Bethea, male   DOB: [birth year], 36 y.o.   MRN: [id number] Patient ID: Aryav Wimberly, male   DOB: [birth year], 36 y.o.   MRN: [id number] Patient ID: Lyal Husted, male   DOB: November 08, 1985, 36 y.o.   MRN: [id number]

## 2021-02-11 NOTE — Evaluation (Signed)
Occupational Therapy Evaluation Patient Details Name: Erik Griffith MRN: [id number] DOB: [birth year] Today's Date: 02/11/2021   History of Present Illness Pt is a 36 y.o. M who presents 02/06/2021 with generalized weakness and fatigue, work up significant for severe renal failure, with creatinine of 23. Possibly related to rhabdomyolysis with diffuse viral myositis plus cocaine versus primary GN. No significant PMH.   Clinical Impression   Pt close to baseline regarding mobility and ADL tasks. Educated on strategies to reduce risk of falls. Encouraged mobility with staff. Pt states he has access to a shower chair if he chooses to use after DC.  No further OT needed. OT signing off.      Recommendations for follow up therapy are one component of a multi-disciplinary discharge planning process, led by the attending physician.  Recommendations may be updated based on patient status, additional functional criteria and insurance authorization.   Follow Up Recommendations  No OT follow up    Assistance Recommended at Discharge Set up Supervision/Assistance  Functional Status Assessment  Patient has had a recent decline in their functional status and demonstrates the ability to make significant improvements in function in a reasonable and predictable amount of time.  Equipment Recommendations  None recommended by OT    Recommendations for Other Services       Precautions / Restrictions Precautions Precautions: Fall      Mobility Bed Mobility Overal bed mobility: Modified Independent                  Transfers Overall transfer level: Modified independent                        Balance Overall balance assessment: Needs assistance Sitting-balance support: Feet supported Sitting balance-Leahy Scale: Good       Standing balance-Leahy Scale: Good                             ADL either performed or assessed with clinical judgement   ADL Overall ADL's :  At baseline                                             Vision         Perception     Praxis      Pertinent Vitals/Pain Pain Assessment: Faces Faces Pain Scale: Hurts a little bit Pain Location: back ("my kidneys") Pain Descriptors / Indicators: Aching;Discomfort Pain Intervention(s): Limited activity within patient's tolerance     Hand Dominance Right   Extremity/Trunk Assessment Upper Extremity Assessment Upper Extremity Assessment: Overall WFL for tasks assessed   Lower Extremity Assessment Lower Extremity Assessment: Defer to PT evaluation   Cervical / Trunk Assessment Cervical / Trunk Assessment: Normal   Communication Communication Communication: No difficulties   Cognition Arousal/Alertness: Awake/alert Behavior During Therapy: WFL for tasks assessed/performed Overall Cognitive Status: Within Functional Limits for tasks assessed                                       General Comments  walking around his room wihtout a RW    Exercises     Shoulder Instructions      Home Living Family/patient expects to be discharged to:: Private residence Living  Arrangements: Spouse/significant other;Children (11, 6, 3 y.o.) Available Help at Discharge: Family Type of Home: Apartment Home Access: Stairs to enter Technical brewer of Steps:  (flight) Entrance Stairs-Rails: Right;Left Home Layout: One level     Bathroom Shower/Tub: Teacher, early years/pre: Standard Bathroom Accessibility: Yes How Accessible: Accessible via walker Home Equipment:  ("I have access to all that stuff")          Prior Functioning/Environment Prior Level of Function : Independent/Modified Independent             Mobility Comments: driving trucks, mostly local          OT Problem List:        OT Treatment/Interventions:      OT Goals(Current goals can be found in the care plan section) Acute Rehab OT Goals Patient  Stated Goal: for his kidneys to start working OT Goal Formulation: All assessment and education complete, DC therapy  OT Frequency:     Barriers to D/C:            Co-evaluation              AM-PAC OT "6 Clicks" Daily Activity     Outcome Measure Help from another person eating meals?: None Help from another person taking care of personal grooming?: None Help from another person toileting, which includes using toliet, bedpan, or urinal?: None Help from another person bathing (including washing, rinsing, drying)?: None Help from another person to put on and taking off regular upper body clothing?: None Help from another person to put on and taking off regular lower body clothing?: None 6 Click Score: 24   End of Session Nurse Communication: Mobility status  Activity Tolerance: Patient tolerated treatment well Patient left: in bed;with call bell/phone within reach  OT Visit Diagnosis: Muscle weakness (generalized) (M62.81)                Time: [id number] OT Time Calculation (min): 19 min Charges:  OT General Charges $OT Visit: 1 Visit OT Evaluation $OT Eval Moderate Complexity: 1 Mod  Verlyn Dannenberg, OT/L   Acute OT Clinical Specialist Acute Rehabilitation Services Pager [phone] Office [phone]   [facility] 02/11/2021, 1:31 PM

## 2021-02-11 NOTE — Progress Notes (Signed)
PT Cancellation Note  Patient Details Name: Erik Griffith MRN: [id number] DOB: 1985-04-10   Cancelled Treatment:    Reason Eval/Treat Not Completed: Other (comment) Pt declining due to HA and nausea; RN notified  Wyona Almas, PT, DPT Acute Rehabilitation Services Pager [phone] Office [phone]    Deno Etienne 02/11/2021, 4:01 PM

## 2021-02-11 NOTE — Progress Notes (Signed)
RT NOTES: Pt performed NIF and VC with great effort. NIF: -35 VC: 2.1L

## 2021-02-12 LAB — RENAL FUNCTION PANEL
Albumin: 2.7 g/dL — ABNORMAL LOW (ref 3.5–5.0)
Anion gap: 14 (ref 5–15)
BUN: 100 mg/dL — ABNORMAL HIGH (ref 6–20)
CO2: 22 mmol/L (ref 22–32)
Calcium: 8.6 mg/dL — ABNORMAL LOW (ref 8.9–10.3)
Chloride: 98 mmol/L (ref 98–111)
Creatinine, Ser: 13.25 mg/dL — ABNORMAL HIGH (ref 0.61–1.24)
GFR, Estimated: 5 mL/min — ABNORMAL LOW
Glucose, Bld: 97 mg/dL (ref 70–99)
Phosphorus: 10.4 mg/dL — ABNORMAL HIGH (ref 2.5–4.6)
Potassium: 4.9 mmol/L (ref 3.5–5.1)
Sodium: 134 mmol/L — ABNORMAL LOW (ref 135–145)

## 2021-02-12 LAB — PROTEIN ELECTROPHORESIS, SERUM
A/G Ratio: 1.1 (ref 0.7–1.7)
Albumin ELP: 2.6 g/dL — ABNORMAL LOW (ref 2.9–4.4)
Alpha-1-Globulin: 0.4 g/dL (ref 0.0–0.4)
Alpha-2-Globulin: 0.8 g/dL (ref 0.4–1.0)
Beta Globulin: 0.7 g/dL (ref 0.7–1.3)
Gamma Globulin: 0.6 g/dL (ref 0.4–1.8)
Globulin, Total: 2.4 g/dL (ref 2.2–3.9)
Total Protein ELP: 5 g/dL — ABNORMAL LOW (ref 6.0–8.5)

## 2021-02-12 LAB — PROTIME-INR
INR: 0.9 (ref 0.8–1.2)
Prothrombin Time: 12.6 seconds (ref 11.4–15.2)

## 2021-02-12 LAB — CBC
HCT: 26.6 % — ABNORMAL LOW (ref 39.0–52.0)
Hemoglobin: 9 g/dL — ABNORMAL LOW (ref 13.0–17.0)
MCH: 29.7 pg (ref 26.0–34.0)
MCHC: 33.8 g/dL (ref 30.0–36.0)
MCV: 87.8 fL (ref 80.0–100.0)
Platelets: 345 10*3/uL (ref 150–400)
RBC: 3.03 MIL/uL — ABNORMAL LOW (ref 4.22–5.81)
RDW: 13.6 % (ref 11.5–15.5)
WBC: 9.9 10*3/uL (ref 4.0–10.5)
nRBC: 0 % (ref 0.0–0.2)

## 2021-02-12 NOTE — Plan of Care (Signed)

## 2021-02-12 NOTE — Progress Notes (Signed)
TRH night cross cover note:  I was notified by RN of the patient's refusal of sq heparin for DVT prophylaxis as well as refusal of any additional DVT prophylaxis measures, including SCDs.    Newton Pigg, DO Hospitalist

## 2021-02-12 NOTE — Progress Notes (Signed)
PROGRESS NOTE    Erik Griffith  KGU:[id number] DOB: 27-Nov-1985 DOA: 02/06/2021 PCP: Patient, No Pcp Per (Inactive)    Chief Complaint  Patient presents with   Chest Pain   Nausea    Brief Narrative:   36 year old male without significant past medical history, possible hypertension, admitted for generalized weakness and fatigue, his work-up was significant for severe renal failure, with a creatinine of 23, he is admitted for further work-up.  Patient had tunneled catheter inserted, he was started on HD by renal, initial concern was for glomerulonephritis, versus autoimmune myositis , urine drug screen came back significant for cocaine, his renal failure was felt secondary to cocaine induced rhabdomyolysis   Assessment & Plan:   Principal Problem:   Acute kidney injury (AKI) with acute tubular necrosis (ATN) (HCC) Active Problems:   Hypocalcemia   Non-traumatic rhabdomyolysis   Hypertension   Weakness generalized   Paresthesia  Severe acute kidney injury -Management per renal, cannot rule out progressive CKD, but he had normal creatinine 2 years ago, he had severe renal failure with uremia on presentation with decreased urine output, severe weakness, nosebleed, and muscle pain, work-up significant for rhabdomyolysis, urine drug screen is positive for cocaine.   -Autoimmune work-up so far negative ANCA, ANA, ANCA GBM, so vasculitis is unlikely, as well, nephritis is less likely, but decision for biopsy to be done later if no improvement to rule out definitive glomera nephritis and to determine chronicity of kidney disease. -RPR, hepatitis panel and HIV is negative, hemoglobin A1c is 5.9. -Dialysis management per renal, he was hemodialyzed today, only for clearance, not for UF, further dialysis per renal. .  Elevated total CK/rhabdomyolysis -Rhabdomyolysis versus myositis -Most likely rhabdomyolysis cocaine induced. -Follow on how to immune work-up(all pending as of 02/11/2021  ) -Neurology input greatly appreciated  Hypertension-uncontrolled,  - started on clonidine, Norvasc and hydralazine, Coreg was added as well, better controlled currently.   -Continue with as needed hydralazine.  Marland Kitchen  Hyponatremia -Management with HD  Anion gap metabolic acidosis -Due to AKI, should correct with HD  Hypocalcemia -Management with HD, on high calcium bath  Stopped DC heparin GTT as venous Dopplers negative.    DVT prophylaxis: [place] heparin  Code Status: Full Family Communication: Discussed with wife at bedside today.    Disposition:   Status is: Inpatient  Remains inpatient appropriate because: On HD       Consultants:  Renal Neurology IR  Procedures:  - [place] placment by IR 12/29   Subjective:  Reports she is feeling much better, ambulating with a walker in the hallway, no dyspnea, no chest pain, no muscle ache. Objective: Vitals:   02/12/21 1100 02/12/21 1130 02/12/21 1301 02/12/21 1545  BP: (!) 169/100 (!) 177/98 132/89 (!) 153/98  Pulse: 100 (!) 103  100  Resp: 20 20  15   Temp:    98.9 F (37.2 C)  TempSrc:    Oral  SpO2:    98%  Weight:      Height:        Intake/Output Summary (Last 24 hours) at 02/12/2021 1615 Last data filed at 02/12/2021 1401 Gross per 24 hour  Intake --  Output 2025 ml  Net -2025 ml   Filed Weights   02/09/21 0750 02/09/21 1055 02/12/21 0756  Weight: 109.8 kg 106.7 kg 106.8 kg    Examination:   Awake Alert, Oriented X 3, No new F.N deficits, Normal affect Symmetrical Chest wall movement, Good air movement bilaterally, CTAB RRR,No Gallops,Rubs or  new Murmurs, No Parasternal Heave +ve B.Sounds, Abd Soft, No tenderness, No rebound - guarding or rigidity. No Cyanosis, Clubbing or edema, No new Rash or bruise        Data Reviewed: I have personally reviewed following labs and imaging studies  CBC: Recent Labs  Lab 02/06/21 2005 02/07/21 0705 02/08/21 0112 02/09/21 0121 02/10/21 0115 02/11/21 0246  02/12/21 0049  WBC  --    < > 9.0 11.0* 9.2 10.1 9.9  NEUTROABS 7.9*  --   --   --   --   --   --   HGB  --    < > 10.1* 9.8* 9.4* 9.0* 9.0*  HCT  --    < > 27.4* 26.6* 26.3* 26.4* 26.6*  MCV  --    < > 81.3 83.4 84.6 86.8 87.8  PLT  --    < > 328 272 243 309 345   < > = values in this interval not displayed.    Basic Metabolic Panel: Recent Labs  Lab 02/06/21 2005 02/07/21 0705 02/08/21 0112 02/09/21 0121 02/10/21 0115 02/11/21 0246 02/12/21 0049  NA  --    < > 131* 132* 133* 133* 134*  K  --    < > 3.7 4.1 4.0 4.3 4.9  CL  --    < > 97* 97* 99 96* 98  CO2  --    < > 18* 19* 23 21* 22  GLUCOSE  --    < > 126* 115* 127* 130* 97  BUN  --    < > 148* 113* 73* 90* 100*  CREATININE  --    < > 17.90* 14.40* 11.12* 12.55* 13.25*  CALCIUM  --    < > 6.9* 7.7* 8.0* 8.3* 8.6*  MG 2.2  --   --   --   --   --   --   PHOS  --   --  6.4* 7.0* 6.4* 9.1* 10.4*   < > = values in this interval not displayed.    GFR: Estimated Creatinine Clearance: 9.7 mL/min (A) (by C-G formula based on SCr of 13.25 mg/dL (H)).  Liver Function Tests: Recent Labs  Lab 02/06/21 2005 02/07/21 2585 02/08/21 0112 02/09/21 0121 02/10/21 0115 02/11/21 0246 02/12/21 0049  AST 42* 35  --   --   --   --   --   ALT 140* 113*  --   --   --   --   --   ALKPHOS 47 48  --   --   --   --   --   BILITOT 0.3 0.6  --   --   --   --   --   PROT 6.4* 5.5*  --   --   --   --   --   ALBUMIN 3.5 2.7* 2.3* 2.5* 2.6* 2.7* 2.7*    CBG: No results for input(s): GLUCAP in the last 168 hours.   Recent Results (from the past 240 hour(s))  Resp Panel by RT-PCR (Flu A&B, Covid) Nasopharyngeal Swab     Status: None   Collection Time: 02/06/21  7:33 PM   Specimen: Nasopharyngeal Swab; Nasopharyngeal(NP) swabs in vial transport medium  Result Value Ref Range Status   SARS Coronavirus 2 by RT PCR NEGATIVE NEGATIVE Final    Comment: (NOTE) SARS-CoV-2 target nucleic acids are NOT DETECTED.  The SARS-CoV-2 RNA is  generally detectable in upper respiratory specimens during the acute phase of infection.  The lowest concentration of SARS-CoV-2 viral copies this assay can detect is 138 copies/mL. A negative result does not preclude SARS-Cov-2 infection and should not be used as the sole basis for treatment or other patient management decisions. A negative result may occur with  improper specimen collection/handling, submission of specimen other than nasopharyngeal swab, presence of viral mutation(s) within the areas targeted by this assay, and inadequate number of viral copies(<138 copies/mL). A negative result must be combined with clinical observations, patient history, and epidemiological information. The expected result is Negative.  Fact Sheet for Patients:  EntrepreneurPulse.com.au  Fact Sheet for Healthcare Providers:  IncredibleEmployment.be  This test is no t yet approved or cleared by the [place] FDA and  has been authorized for detection and/or diagnosis of SARS-CoV-2 by FDA under an Emergency Use Authorization (EUA). This EUA will remain  in effect (meaning this test can be used) for the duration of the COVID-19 declaration under Section 564(b)(1) of the Act, 21 U.S.C.section 360bbb-3(b)(1), unless the authorization is terminated  or revoked sooner.       Influenza A by PCR NEGATIVE NEGATIVE Final   Influenza B by PCR NEGATIVE NEGATIVE Final    Comment: (NOTE) The Xpert Xpress SARS-CoV-2/FLU/RSV plus assay is intended as an aid in the diagnosis of influenza from Nasopharyngeal swab specimens and should not be used as a sole basis for treatment. Nasal washings and aspirates are unacceptable for Xpert Xpress SARS-CoV-2/FLU/RSV testing.  Fact Sheet for Patients: EntrepreneurPulse.com.au  Fact Sheet for Healthcare Providers: IncredibleEmployment.be  This test is not yet approved or cleared by the [place] FDA and has been authorized for detection and/or diagnosis of SARS-CoV-2 by FDA under an Emergency Use Authorization (EUA). This EUA will remain in effect (meaning this test can be used) for the duration of the COVID-19 declaration under Section 564(b)(1) of the Act, 21 U.S.C. section 360bbb-3(b)(1), unless the authorization is terminated or revoked.  Performed at [facility], [address], [place], [place] [address]          Radiology Studies: No results found.      Scheduled Meds:  amLODipine  10 mg Oral Daily   carvedilol  6.25 mg Oral BID WC   Chlorhexidine Gluconate Cloth  6 each Topical Q0600   heparin injection (subcutaneous)  5,000 Units Subcutaneous Q8H   hydrALAZINE  50 mg Oral Q6H   Continuous Infusions:     LOS: 5 days       Phillips Climes, MD [facility]   To contact the attending provider between 7A-7P or the covering provider during after hours 7P-7A, please log into the web site [email or web address] and access using universal [place] password for that web site. If you do not have the password, please call the hospital operator.  02/12/2021, 4:15 PM   Patient ID: Erik Griffith, male   DOB: 20-Sep-1985, 36 y.o.   MRN: [id number] Patient ID: Erik Griffith, male   DOB: 1986/01/10, 36 y.o.   MRN: [id number] Patient ID: Erik Griffith, male   DOB: 1985/09/21, 36 y.o.   MRN: [id number] Patient ID: Erik Griffith, male   DOB: 09/14/1985, 36 y.o.   MRN: [id number] Patient ID: Erik Griffith, male   DOB: Oct 07, 1985, 36 y.o.   MRN: [id number] Patient ID: Erik Griffith, male   DOB: [birth year], 36 y.o.   MRN: [id number]

## 2021-02-12 NOTE — Plan of Care (Signed)

## 2021-02-12 NOTE — Progress Notes (Signed)
[facility] Progress Note    36 y.o. male with a past medical history significant for possible htn, admitted for severe renal failure.        Assessment/ Plan:   1) Severe Acute Kidney Injury; cannot rule out progressive CKD: normal Crt about 2 years ago but no data since that time.  Some of the symptoms preceded by possible viral illness.  Now w/ severe renal failure w/ some uremia and decreased UOP. Also with severe weakness and nose bleeds. History of UA w/ protein and hematuria.  GN less likely with positive urine tox for cocaine especially with negative ANCA making levamisole induced vasculitis unlikely.   Presumptive diagnosis of  rhabdo. (ANCA, ANA, anti-GBM, etc) have all been negative   Still  hopeful he will have renal recovery; last HD 12/31 with 2L net UF.    Good UOP but renal function still doesn't demonstrate significant clearnace; will dialyze today only for clearance with no UF.   Holding off on kidney biopsy for now but if cr not improving by later in the week may need a kidney biopsy  to rule out definitive GN +  also determine level of chronicity to help predict chances of recovery. I spoke with him about this again and he understands.   -appreciate TDC placement by IR -GN labs negative -Myositis work up with neurology -RUS with slightly large kidneys (~13cm) and increase cortical echogenicity -Continue to monitor daily Cr, Dose meds for GFR -Monitor Daily I/Os, Daily weight  -Maintain MAP>65 for optimal renal perfusion.  -Avoid nephrotoxic medications including NSAIDs   2) Hypertension: history of such, unclear control.  Higher at this time.  UF with HD. Started coreg bid -> felt better after the lopressor IV last night.   3) Weakness: Work-up per nephrology.  Likely multifactorial.  Profound uremia could definitely be a contributing factor. Possible myositis; viral? Cocaine? Seems to be improving.   4) Hyponatremia: Likely related to free water  retention in the setting of severe AKI.  Dialysis as above   5) Anion gap metabolic acidosis: now improved w/ HD.   6) Hypocalcemia: Overall improved. High calcium bath with dialysis.  Subjective:   Denies fever, chills, nausea, anorexia or dyspnea. Felt better after the lopressor last night.   Objective:   BP (!) 155/98 (BP Location: Left Arm)    Pulse 100    Temp 98.9 F (37.2 C) (Oral)    Resp 17    Ht 5\' 11"  (1.803 m)    Wt 106.7 kg    SpO2 98%    BMI 32.81 kg/m   Intake/Output Summary (Last 24 hours) at 02/12/2021 5188 Last data filed at 02/12/2021 4166 Gross per 24 hour  Intake --  Output 2425 ml  Net -2425 ml   Weight change:   Physical Exam: Constitutional:  lying in bed, no distress ENMT: ears and nose without scars or lesions CV: normal rate, trace edema in the bilateral lower extremities Respiratory: bilateral chest rise with increased work of breathing Gastrointestinal: soft, non-tender, no palpable masses or hernias Skin: no visible lesions or rashes Psych: alert, judgement/insight appropriate, appropriate mood and affect Access: RIJ TC   Imaging: No results found.  Labs: BMET Recent Labs  Lab 02/06/21 1932 02/07/21 0705 02/08/21 0112 02/09/21 0121 02/10/21 0115 02/11/21 0246 02/12/21 0049  NA 127* 127* 131* 132* 133* 133* 134*  K 4.2 3.7 3.7 4.1 4.0 4.3 4.9  CL 89* 91* 97* 97* 99 96* 98  CO2  10* 11* 18* 19* 23 21* 22  GLUCOSE 132* 105* 126* 115* 127* 130* 97  BUN 199* 209* 148* 113* 73* 90* 100*  CREATININE 23.58* 23.31* 17.90* 14.40* 11.12* 12.55* 13.25*  CALCIUM 6.3* 5.8* 6.9* 7.7* 8.0* 8.3* 8.6*  PHOS  --   --  6.4* 7.0* 6.4* 9.1* 10.4*   CBC Recent Labs  Lab 02/06/21 2005 02/07/21 0705 02/09/21 0121 02/10/21 0115 02/11/21 0246 02/12/21 0049  WBC  --    < > 11.0* 9.2 10.1 9.9  NEUTROABS 7.9*  --   --   --   --   --   HGB  --    < > 9.8* 9.4* 9.0* 9.0*  HCT  --    < > 26.6* 26.3* 26.4* 26.6*  MCV  --    < > 83.4 84.6 86.8 87.8  PLT   --    < > 272 243 309 345   < > = values in this interval not displayed.    Medications:     amLODipine  10 mg Oral Daily   carvedilol  6.25 mg Oral BID WC   Chlorhexidine Gluconate Cloth  6 each Topical Q0600   heparin injection (subcutaneous)  5,000 Units Subcutaneous Q8H   hydrALAZINE  50 mg Oral Q6H      Otelia Santee, MD 02/12/2021, 7:11 AM

## 2021-02-12 NOTE — Progress Notes (Signed)
PT Cancellation Note  Patient Details Name: Erik Griffith MRN: [id number] DOB: 1986/01/20   Cancelled Treatment:    Reason Eval/Treat Not Completed: Patient at procedure or test/unavailable (HD).  Wyona Almas, PT, DPT Acute Rehabilitation Services Pager [phone] Office [phone]    Deno Etienne 02/12/2021, 9:10 AM

## 2021-02-12 NOTE — Progress Notes (Signed)
Patient was given prn iv lopressor last night instead of iv hydralazine and patient's blood pressure responded much better than when the patient received the hydralazine.

## 2021-02-13 LAB — CBC
HCT: 25.2 % — ABNORMAL LOW (ref 39.0–52.0)
Hemoglobin: 8.5 g/dL — ABNORMAL LOW (ref 13.0–17.0)
MCH: 29.9 pg (ref 26.0–34.0)
MCHC: 33.7 g/dL (ref 30.0–36.0)
MCV: 88.7 fL (ref 80.0–100.0)
Platelets: 346 10*3/uL (ref 150–400)
RBC: 2.84 MIL/uL — ABNORMAL LOW (ref 4.22–5.81)
RDW: 13.2 % (ref 11.5–15.5)
WBC: 8.7 10*3/uL (ref 4.0–10.5)
nRBC: 0 % (ref 0.0–0.2)

## 2021-02-13 LAB — RENAL FUNCTION PANEL
Albumin: 2.7 g/dL — ABNORMAL LOW (ref 3.5–5.0)
Anion gap: 11 (ref 5–15)
BUN: 53 mg/dL — ABNORMAL HIGH (ref 6–20)
CO2: 26 mmol/L (ref 22–32)
Calcium: 9.1 mg/dL (ref 8.9–10.3)
Chloride: 99 mmol/L (ref 98–111)
Creatinine, Ser: 7.76 mg/dL — ABNORMAL HIGH (ref 0.61–1.24)
GFR, Estimated: 9 mL/min — ABNORMAL LOW (ref >60)
Glucose, Bld: 97 mg/dL (ref 70–99)
Phosphorus: 8.4 mg/dL — ABNORMAL HIGH (ref 2.5–4.6)
Potassium: 4.5 mmol/L (ref 3.5–5.1)
Sodium: 136 mmol/L (ref 135–145)

## 2021-02-13 MED ORDER — CARVEDILOL 12.5 MG PO TABS
12.5000 mg | ORAL_TABLET | Freq: Two times a day (BID) | ORAL | Status: DC
  Administered 2021-02-13 – 2021-02-14 (×3): 12.5 mg via ORAL
  Filled 2021-02-13 (×3): qty 1

## 2021-02-13 MED ORDER — HEPARIN SODIUM (PORCINE) 5000 UNIT/ML IJ SOLN: 5000.0000 [IU] | Freq: Three times a day (TID) | INTRAMUSCULAR | Status: DC

## 2021-02-13 NOTE — Progress Notes (Addendum)
Requested to see pt for out-pt HD needs. Met with pt at bedside. Pt has wife on speaker phone during conversation. Explained role and referral process. Pt resides in [place] and discussed clinic options. Pt prefers [facility] [place] if possible. Referral made to [facility] admissions this afternoon. Pt is currently uninsured but agreeable to speak to [employer] staff via phone if needed. MD feels pt will likely only need HD 2x's a week. Pt reports that he plans to drive self to HD appts or wife can assist if needed. Will follow and assist with HD needs.    Melven Sartorius Renal Navigator [phone]  Addendum at 2:27 pm: Received a message from [facility] to say that until pt's medicaid is active there is no coverage for AKI at out-pt clinic. Update provided to nephrologist.

## 2021-02-13 NOTE — Progress Notes (Signed)
PROGRESS NOTE        PATIENT DETAILS Name: Erik Griffith Age: 36 y.o. Sex: male Date of Birth: 1985-12-31 Admit Date: 02/06/2021 Admitting Physician Vernelle Emerald, MD [id number], No Pcp Per (Inactive)  Brief Narrative: Patient is a 36 y.o. male with history of HTN-who presented with fatigue/weakness-found to have new onset AKI.  See below for further details.  Subjective: Lying comfortably in bed-denies any chest pain or shortness of breath.  Objective: Vitals: Blood pressure (!) 144/111, pulse 96, temperature 98.3 F (36.8 C), temperature source Oral, resp. rate 17, height 5\' 11"  (1.803 m), weight 106.8 kg, SpO2 99 %.   Exam: Gen Exam:Alert awake-not in any distress HEENT:atraumatic, normocephalic Chest: B/L clear to auscultation anteriorly CVS:S1S2 regular Abdomen:soft non tender, non distended Extremities:no edema Neurology: Non focal Skin: no rash  Pertinent Labs/Radiology: Recent Labs  Lab 02/06/21 2005 02/07/21 0705 02/08/21 0112 02/13/21 0136  WBC  --  10.0   < > 8.7  HGB  --  10.8*   < > 8.5*  PLT  --  396   < > 346  NA  --  127*   < > 136  K  --  3.7   < > 4.5  CREATININE  --  23.31*   < > 7.76*  AST 42* 35  --   --   ALT 140* 113*  --   --   ALKPHOS 47 48  --   --   BILITOT 0.3 0.6  --   --    < > = values in this interval not displayed.    Assessment/Plan: AKI: Thought to be tubular injury in the setting of rhabdomyolysis from cocaine use.  Autoimmune serology/SPEP negative so far.  Has required intermittent HD-nephrology following-plans are to watch closely-and await further renal recovery.  Normocytic anemia: Due to AKI/acute illness: No indication to transfuse PRBC at this point-follow closely.  Rhabdomyolysis: CK has significantly improved-likely due to cocaine use.  Myositis: Due to cocaine use-likely causing rhabdomyolysis.  Myositis panel still pending.  HTN: BP stable-continue Norvasc, hydralazine and  Coreg.  Cocaine use: Counseling prior to discharge  Obesity: Estimated body mass index is 32.84 kg/m as calculated from the following:   Height as of this encounter: 5\' 11"  (1.803 m).   Weight as of this encounter: 106.8 kg.   Procedures: None Consults: Renal, neurology DVT Prophylaxis: SQ Heparin Code Status:Full code  Family Communication: None at bedside  Time spent: 25- minutes-Greater than 50% of this time was spent in counseling, explanation of diagnosis, planning of further management, and coordination of care.   Disposition Plan: Status is: Inpatient  Remains inpatient appropriate because: AKI-requiring intermittent HD-awaiting renal recovery.    Diet: Diet Order             Diet 2 gram sodium Room service appropriate? Yes; Fluid consistency: Thin; Fluid restriction: 1800 mL Fluid  Diet effective now                     Antimicrobial agents: Anti-infectives (From admission, onward)    Start     Dose/Rate Route Frequency Ordered Stop   02/07/21 1400  ceFAZolin (ANCEF) IVPB 2g/100 mL premix  Status:  Discontinued        2 g 200 mL/hr over 30 Minutes Intravenous Every 8 hours 02/07/21 1219 02/07/21 1220  02/07/21 1315  ceFAZolin (ANCEF) IVPB 2g/100 mL premix        2 g 200 mL/hr over 30 Minutes Intravenous  Once 02/07/21 1220 02/07/21 1250        MEDICATIONS: Scheduled Meds:  amLODipine  10 mg Oral Daily   carvedilol  12.5 mg Oral BID WC   Chlorhexidine Gluconate Cloth  6 each Topical Q0600   hydrALAZINE  50 mg Oral Q6H   Continuous Infusions: PRN Meds:.acetaminophen **OR** acetaminophen, albuterol, hydrALAZINE, metoprolol tartrate, ondansetron **OR** ondansetron (ZOFRAN) IV, oxyCODONE, senna-docusate, traZODone   I have personally reviewed following labs and imaging studies  LABORATORY DATA: CBC: Recent Labs  Lab 02/06/21 2005 02/07/21 0705 02/09/21 0121 02/10/21 0115 02/11/21 0246 02/12/21 0049 02/13/21 0136  WBC  --    < > 11.0*  9.2 10.1 9.9 8.7  NEUTROABS 7.9*  --   --   --   --   --   --   HGB  --    < > 9.8* 9.4* 9.0* 9.0* 8.5*  HCT  --    < > 26.6* 26.3* 26.4* 26.6* 25.2*  MCV  --    < > 83.4 84.6 86.8 87.8 88.7  PLT  --    < > 272 243 309 345 346   < > = values in this interval not displayed.    Basic Metabolic Panel: Recent Labs  Lab 02/06/21 2005 02/07/21 0705 02/09/21 0121 02/10/21 0115 02/11/21 0246 02/12/21 0049 02/13/21 0136  NA  --    < > 132* 133* 133* 134* 136  K  --    < > 4.1 4.0 4.3 4.9 4.5  CL  --    < > 97* 99 96* 98 99  CO2  --    < > 19* 23 21* 22 26  GLUCOSE  --    < > 115* 127* 130* 97 97  BUN  --    < > 113* 73* 90* 100* 53*  CREATININE  --    < > 14.40* 11.12* 12.55* 13.25* 7.76*  CALCIUM  --    < > 7.7* 8.0* 8.3* 8.6* 9.1  MG 2.2  --   --   --   --   --   --   PHOS  --    < > 7.0* 6.4* 9.1* 10.4* 8.4*   < > = values in this interval not displayed.    GFR: Estimated Creatinine Clearance: 16.5 mL/min (A) (by C-G formula based on SCr of 7.76 mg/dL (H)).  Liver Function Tests: Recent Labs  Lab 02/06/21 2005 02/07/21 [id number] 02/08/21 0112 02/09/21 0121 02/10/21 0115 02/11/21 0246 02/12/21 0049 02/13/21 0136  AST 42* 35  --   --   --   --   --   --   ALT 140* 113*  --   --   --   --   --   --   ALKPHOS 47 48  --   --   --   --   --   --   BILITOT 0.3 0.6  --   --   --   --   --   --   PROT 6.4* 5.5*  --   --   --   --   --   --   ALBUMIN 3.5 2.7*   < > 2.5* 2.6* 2.7* 2.7* 2.7*   < > = values in this interval not displayed.   No results for input(s): LIPASE, AMYLASE in the  last 168 hours. No results for input(s): AMMONIA in the last 168 hours.  Coagulation Profile: Recent Labs  Lab 02/08/21 0112 02/12/21 0049  INR 1.2 0.9    Cardiac Enzymes: Recent Labs  Lab 02/07/21 0444 02/08/21 0112 02/09/21 0121 02/10/21 0115 02/11/21 0246  CKTOTAL 9,271* 4,934* 2,457* 1,071* 650*    BNP (last 3 results) No results for input(s): PROBNP in the last 8760  hours.  Lipid Profile: No results for input(s): CHOL, HDL, LDLCALC, TRIG, CHOLHDL, LDLDIRECT in the last 72 hours.  Thyroid Function Tests: No results for input(s): TSH, T4TOTAL, FREET4, T3FREE, THYROIDAB in the last 72 hours.  Anemia Panel: No results for input(s): VITAMINB12, FOLATE, FERRITIN, TIBC, IRON, RETICCTPCT in the last 72 hours.  Urine analysis:    Component Value Date/Time   COLORURINE STRAW (A) 02/08/2021 0740   APPEARANCEUR CLEAR 02/08/2021 0740   LABSPEC 1.009 02/08/2021 0740   PHURINE 5.0 02/08/2021 0740   GLUCOSEU NEGATIVE 02/08/2021 0740   HGBUR MODERATE (A) 02/08/2021 0740   BILIRUBINUR NEGATIVE 02/08/2021 0740   KETONESUR NEGATIVE 02/08/2021 0740   PROTEINUR 30 (A) 02/08/2021 0740   NITRITE NEGATIVE 02/08/2021 0740   LEUKOCYTESUR NEGATIVE 02/08/2021 0740    Sepsis Labs: Lactic Acid, Venous No results found for: LATICACIDVEN  MICROBIOLOGY: Recent Results (from the past 240 hour(s))  Resp Panel by RT-PCR (Flu A&B, Covid) Nasopharyngeal Swab     Status: None   Collection Time: 02/06/21  7:33 PM   Specimen: Nasopharyngeal Swab; Nasopharyngeal(NP) swabs in vial transport medium  Result Value Ref Range Status   SARS Coronavirus 2 by RT PCR NEGATIVE NEGATIVE Final    Comment: (NOTE) SARS-CoV-2 target nucleic acids are NOT DETECTED.  The SARS-CoV-2 RNA is generally detectable in upper respiratory specimens during the acute phase of infection. The lowest concentration of SARS-CoV-2 viral copies this assay can detect is 138 copies/mL. A negative result does not preclude SARS-Cov-2 infection and should not be used as the sole basis for treatment or other patient management decisions. A negative result may occur with  improper specimen collection/handling, submission of specimen other than nasopharyngeal swab, presence of viral mutation(s) within the areas targeted by this assay, and inadequate number of viral copies(<138 copies/mL). A negative result must be  combined with clinical observations, patient history, and epidemiological information. The expected result is Negative.  Fact Sheet for Patients:  EntrepreneurPulse.com.au  Fact Sheet for Healthcare Providers:  IncredibleEmployment.be  This test is no t yet approved or cleared by the [place] FDA and  has been authorized for detection and/or diagnosis of SARS-CoV-2 by FDA under an Emergency Use Authorization (EUA). This EUA will remain  in effect (meaning this test can be used) for the duration of the COVID-19 declaration under Section 564(b)(1) of the Act, 21 U.S.C.section 360bbb-3(b)(1), unless the authorization is terminated  or revoked sooner.       Influenza A by PCR NEGATIVE NEGATIVE Final   Influenza B by PCR NEGATIVE NEGATIVE Final    Comment: (NOTE) The Xpert Xpress SARS-CoV-2/FLU/RSV plus assay is intended as an aid in the diagnosis of influenza from Nasopharyngeal swab specimens and should not be used as a sole basis for treatment. Nasal washings and aspirates are unacceptable for Xpert Xpress SARS-CoV-2/FLU/RSV testing.  Fact Sheet for Patients: EntrepreneurPulse.com.au  Fact Sheet for Healthcare Providers: IncredibleEmployment.be  This test is not yet approved or cleared by the [place] FDA and has been authorized for detection and/or diagnosis of SARS-CoV-2 by FDA under an Emergency Use Authorization (  EUA). This EUA will remain in effect (meaning this test can be used) for the duration of the COVID-19 declaration under Section 564(b)(1) of the Act, 21 U.S.C. section 360bbb-3(b)(1), unless the authorization is terminated or revoked.  Performed at [facility], [address], [place], [place] [address]     RADIOLOGY STUDIES/RESULTS: No results found.   LOS: 6 days   Oren Binet, MD  [facility]    To contact the attending provider  between 7A-7P or the covering provider during after hours 7P-7A, please log into the web site [email or web address] and access using universal [place] password for that web site. If you do not have the password, please call the hospital operator.  02/13/2021, 1:33 PM

## 2021-02-13 NOTE — Progress Notes (Signed)
[facility] Progress Note    36 y.o. male with a past medical history significant for possible htn, admitted for severe renal failure.        Assessment/ Plan:   1) Severe Acute Kidney Injury; cannot rule out progressive CKD: normal Crt about 2 years ago but no data since that time.  Some of the symptoms preceded by possible viral illness.  Now w/ severe renal failure w/ some uremia and decreased UOP. Also with severe weakness and nose bleeds. History of UA w/ protein and hematuria.  GN less likely with positive urine tox for cocaine especially with negative ANCA making levamisole induced vasculitis unlikely.   Presumptive diagnosis of  rhabdo. (ANCA, ANA, anti-GBM, etc) have all been negative   Still  hopeful he will have renal recovery; last HD 12/31 with 2L net UF.    Good UOP but renal function still doesn't demonstrate significant clearnace;  dialysis 1/3 for clearance with no UF which he tolerated. Would like to give RRT holiday for a few days in anticipation of renal recovery.   Planning on holding off on kidney biopsy for now but if cr not improving soon may need a kidney biopsy  to rule out definitive GN +  also determine level of chronicity to help predict chances of recovery. I spoke with him + spouse today about this again and he understands; they prefer to wait till early next week.    Will start having renal navigator work on finding a center given acute kidney injury from ATN.  -appreciate TDC placement by IR -GN labs negative -Myositis work up with neurology -RUS with slightly large kidneys (~13cm) and increase cortical echogenicity -Continue to monitor daily Cr, Dose meds for GFR -Monitor Daily I/Os, Daily weight  -Maintain MAP>65 for optimal renal perfusion.  -Avoid nephrotoxic medications including NSAIDs   2) Hypertension: history of such, unclear control.  Higher at this time.  Started on coreg bid -> responds to  lopressor IV.  Increase carvedilol  to 12.5mg  BID   3) Weakness: Work-up per nephrology.  Likely multifactorial.  Profound uremia could definitely be a contributing factor. Possible myositis; viral? Cocaine? Seems to be improving.   4) Hyponatremia: Likely related to free water retention in the setting of severe AKI.  Will cont to monitor   5) Anion gap metabolic acidosis: now improved w/ HD.   6) Hypocalcemia: Overall improved. High calcium bath with dialysis.  Subjective:   Denies fever, chills, nausea, anorexia or dyspnea. BP better controlled overnight and now higher in the am prior to meds. Tolerated hd yest.   Objective:   BP (!) 140/102 (BP Location: Left Arm)    Pulse 91    Temp 98.6 F (37 C) (Oral)    Resp 14    Ht 5\' 11"  (1.803 m)    Wt 106.8 kg    SpO2 98%    BMI 32.84 kg/m   Intake/Output Summary (Last 24 hours) at 02/13/2021 0659 Last data filed at 02/13/2021 0450 Gross per 24 hour  Intake --  Output 1500 ml  Net -1500 ml   Weight change:   Physical Exam: Constitutional:  lying in bed, no distress ENMT: ears and nose without scars or lesions CV: normal rate, trace edema in the bilateral lower extremities Respiratory: bilateral chest rise with increased work of breathing Gastrointestinal: soft, non-tender, no palpable masses or hernias Skin: no visible lesions or rashes Psych: alert, judgement/insight appropriate, appropriate mood and affect Access: RIJ TC  Imaging: No results found.  Labs: BMET Recent Labs  Lab 02/07/21 0705 02/08/21 0112 02/09/21 0121 02/10/21 0115 02/11/21 0246 02/12/21 0049 02/13/21 0136  NA 127* 131* 132* 133* 133* 134* 136  K 3.7 3.7 4.1 4.0 4.3 4.9 4.5  CL 91* 97* 97* 99 96* 98 99  CO2 11* 18* 19* 23 21* 22 26  GLUCOSE 105* 126* 115* 127* 130* 97 97  BUN 209* 148* 113* 73* 90* 100* 53*  CREATININE 23.31* 17.90* 14.40* 11.12* 12.55* 13.25* 7.76*  CALCIUM 5.8* 6.9* 7.7* 8.0* 8.3* 8.6* 9.1  PHOS  --  6.4* 7.0* 6.4* 9.1* 10.4* 8.4*   CBC Recent Labs  Lab  02/06/21 2005 02/07/21 0705 02/10/21 0115 02/11/21 0246 02/12/21 0049 02/13/21 0136  WBC  --    < > 9.2 10.1 9.9 8.7  NEUTROABS 7.9*  --   --   --   --   --   HGB  --    < > 9.4* 9.0* 9.0* 8.5*  HCT  --    < > 26.3* 26.4* 26.6* 25.2*  MCV  --    < > 84.6 86.8 87.8 88.7  PLT  --    < > 243 309 345 346   < > = values in this interval not displayed.    Medications:     amLODipine  10 mg Oral Daily   carvedilol  6.25 mg Oral BID WC   Chlorhexidine Gluconate Cloth  6 each Topical Q0600   hydrALAZINE  50 mg Oral Q6H      Otelia Santee, MD 02/13/2021, 6:59 AM

## 2021-02-13 NOTE — Progress Notes (Signed)
Physical Therapy Treatment Patient Details Name: Erik Griffith MRN: [id number] DOB: January 19, 1986 Today's Date: 02/13/2021   History of Present Illness Pt is a 36 y.o. M who presents 02/06/2021 with generalized weakness and fatigue, work up significant for severe renal failure, with creatinine of 23. Possibly related to rhabdomyolysis with diffuse viral myositis plus cocaine versus primary GN. No significant PMH.    PT Comments    Pt progressing steadily toward goals.  Still weak, HR tachy with minimal activity, balance functional and safe.  Emphasis on transitions, transfers, progression of gait stability/speed and stamina.    Recommendations for follow up therapy are one component of a multi-disciplinary discharge planning process, led by the attending physician.  Recommendations may be updated based on patient status, additional functional criteria and insurance authorization.  Follow Up Recommendations  No PT follow up     Assistance Recommended at Discharge PRN  Patient can return home with the following     Equipment Recommendations  Rolling walker (2 wheels)    Recommendations for Other Services       Precautions / Restrictions Precautions Precautions: Fall     Mobility  Bed Mobility Overal bed mobility: Modified Independent                  Transfers Overall transfer level: Modified independent                      Ambulation/Gait Ambulation/Gait assistance: Supervision Gait Distance (Feet): 200 Feet Assistive device: None (carrying the portable monitor) Gait Pattern/deviations: Step-through pattern Gait velocity: decreased Gait velocity interpretation: 1.31 - 2.62 ft/sec, indicative of limited community ambulator   General Gait Details: generally steady, low amplitude slower steps.  Pt able to scan and interact with people in the halls without deviation.   Stairs             Wheelchair Mobility    Modified Rankin (Stroke Patients  Only)       Balance Overall balance assessment: Needs assistance Sitting-balance support: Feet supported Sitting balance-Leahy Scale: Good     Standing balance support: No upper extremity supported;During functional activity Standing balance-Leahy Scale: Good                              Cognition Arousal/Alertness: Awake/alert Behavior During Therapy: WFL for tasks assessed/performed Overall Cognitive Status: Within Functional Limits for tasks assessed                                          Exercises      General Comments General comments (skin integrity, edema, etc.): HR rising into the mid 120's, SpO2 mid 90's on RA, all during activity.      Pertinent Vitals/Pain Pain Assessment: Faces Faces Pain Scale: No hurt Pain Intervention(s): Monitored during session    Home Living                          Prior Function            PT Goals (current goals can now be found in the care plan section) Acute Rehab PT Goals Patient Stated Goal: to walk and get back to work PT Goal Formulation: With patient Time For Goal Achievement: 02/24/21 Potential to Achieve Goals: Good Progress towards PT goals: Progressing toward goals  Frequency    Min 5X/week      PT Plan Current plan remains appropriate    Co-evaluation              AM-PAC PT "6 Clicks" Mobility   Outcome Measure  Help needed turning from your back to your side while in a flat bed without using bedrails?: None Help needed moving from lying on your back to sitting on the side of a flat bed without using bedrails?: None Help needed moving to and from a bed to a chair (including a wheelchair)?: A Little Help needed standing up from a chair using your arms (e.g., wheelchair or bedside chair)?: A Little Help needed to walk in hospital room?: A Little Help needed climbing 3-5 steps with a railing? : A Little 6 Click Score: 20    End of Session   Activity  Tolerance: Patient tolerated treatment well Patient left: in bed;with call bell/phone within reach Nurse Communication: Mobility status PT Visit Diagnosis: Other abnormalities of gait and mobility (R26.89);Difficulty in walking, not elsewhere classified (R26.2)     Time: [id number] PT Time Calculation (min) (ACUTE ONLY): 16 min  Charges:  $Gait Training: 8-22 mins                     02/13/2021  Erik Griffith., PT Acute Rehabilitation Services [phone]  (pager) [phone]  (office)   Erik Griffith 02/13/2021, 4:46 PM

## 2021-02-13 NOTE — Plan of Care (Signed)
  Problem: Pain Managment: Goal: General experience of comfort will improve Outcome: Progressing   Problem: Safety: Goal: Ability to remain free from injury will improve Outcome: Progressing   

## 2021-02-14 DIAGNOSIS — D649 Anemia, unspecified: Secondary | ICD-10-CM

## 2021-02-14 LAB — CBC
HCT: 27.3 % — ABNORMAL LOW (ref 39.0–52.0)
Hemoglobin: 8.9 g/dL — ABNORMAL LOW (ref 13.0–17.0)
MCH: 29.3 pg (ref 26.0–34.0)
MCHC: 32.6 g/dL (ref 30.0–36.0)
MCV: 89.8 fL (ref 80.0–100.0)
Platelets: 367 K/uL (ref 150–400)
RBC: 3.04 MIL/uL — ABNORMAL LOW (ref 4.22–5.81)
RDW: 13.1 % (ref 11.5–15.5)
WBC: 10.2 K/uL (ref 4.0–10.5)
nRBC: 0 % (ref 0.0–0.2)

## 2021-02-14 LAB — MISC LABCORP TEST (SEND OUT): Labcorp test code: 520015

## 2021-02-14 LAB — RENAL FUNCTION PANEL
Albumin: 2.8 g/dL — ABNORMAL LOW (ref 3.5–5.0)
Anion gap: 14 (ref 5–15)
BUN: 61 mg/dL — ABNORMAL HIGH (ref 6–20)
CO2: 24 mmol/L (ref 22–32)
Calcium: 10.3 mg/dL (ref 8.9–10.3)
Chloride: 100 mmol/L (ref 98–111)
Creatinine, Ser: 8.32 mg/dL — ABNORMAL HIGH (ref 0.61–1.24)
GFR, Estimated: 8 mL/min — ABNORMAL LOW
Glucose, Bld: 132 mg/dL — ABNORMAL HIGH (ref 70–99)
Phosphorus: 9.8 mg/dL — ABNORMAL HIGH (ref 2.5–4.6)
Potassium: 4.1 mmol/L (ref 3.5–5.1)
Sodium: 138 mmol/L (ref 135–145)

## 2021-02-14 LAB — IMMUNOFIXATION ELECTROPHORESIS
IgA: 98 mg/dL (ref 90–386)
IgG (Immunoglobin G), Serum: 611 mg/dL (ref 603–1613)
IgM (Immunoglobulin M), Srm: 35 mg/dL (ref 20–172)
Total Protein ELP: 5.5 g/dL — ABNORMAL LOW (ref 6.0–8.5)

## 2021-02-14 MED ORDER — KIDNEY FAILURE BOOK: Freq: Once | Status: AC

## 2021-02-14 MED ORDER — CARVEDILOL 12.5 MG PO TABS
12.5000 mg | ORAL_TABLET | Freq: Once | ORAL | Status: AC
  Administered 2021-02-14: 12.5 mg via ORAL
  Filled 2021-02-14: qty 1

## 2021-02-14 MED ORDER — CARVEDILOL 25 MG PO TABS
25.0000 mg | ORAL_TABLET | Freq: Two times a day (BID) | ORAL | Status: DC
  Administered 2021-02-14 – 2021-02-17 (×6): 25 mg via ORAL
  Filled 2021-02-14 (×6): qty 1

## 2021-02-14 NOTE — Progress Notes (Signed)
IR consulted for random renal biopsy and the patient was seen/consented 02/08/21. Nephrology wanted to allow more time for renal recovery and requested to hold off on biopsy.   Nephrology now ready for renal biopsy - IR will plan for 02/15/21. Patient will be NPO, AM labs ordered. Please see full consult note dated 02/08/21 for more information.  Alwyn Ren, [place] [phone] 02/14/2021, 8:51 AM

## 2021-02-14 NOTE — Progress Notes (Signed)
[facility] Progress Note    36 y.o. male with a past medical history significant for possible htn, admitted for severe renal failure.        Assessment/ Plan:   1) Severe Acute Kidney Injury; cannot rule out progressive CKD: normal Crt about 2 years ago but no data since that time.  Some of the symptoms preceded by possible viral illness.  Now w/ severe renal failure w/ some uremia and decreased UOP. Also with severe weakness and nose bleeds. History of UA w/ protein and hematuria.  GN less likely with positive urine tox for cocaine especially with negative ANCA making levamisole induced vasculitis unlikely.   Presumptive diagnosis of  rhabdo. (ANCA, ANA, anti-GBM, etc) have all been negative   Still  hopeful he will have renal recovery; last HD 12/31 with 2L net UF and then 1/3 0UF.     Good UOP (1.2L) but BUN/Cr mild increase hopefully represents that he is starting to level off.  Plan on kidney biopsy for Friday to rule out definitive GN +  also determine level of chronicity to help predict chances of recovery. I spoke with him  today about this again and he understands. Will also check labs 1st thing in the AM.   Unfortunately renal navigator will not be able to find him  a center for the AKI because of logistics; appreciate the renal navigator looking into it.  -appreciate TDC placement by IR -GN labs negative -Myositis work up with neurology -RUS with slightly large kidneys (~13cm) and increase cortical echogenicity -Continue to monitor daily Cr, Dose meds for GFR -Monitor Daily I/Os, Daily weight  -Maintain MAP>65 for optimal renal perfusion.  -Avoid nephrotoxic medications including NSAIDs   2) Hypertension: history of such, unclear control.  Higher at this time.  Started on coreg bid -> responds to  lopressor IV.  Increased carvedilol to 12.5mg  BID on 1/4   3) Weakness: Work-up per nephrology.  Likely multifactorial.  Profound uremia could definitely be a  contributing factor. Possible myositis; viral? Cocaine? Seems to be improving.   4) Hyponatremia: Likely related to free water retention in the setting of severe AKI.  Will cont to monitor   5) Anion gap metabolic acidosis: now improved w/ HD.   6) Hypocalcemia: Overall improved. High calcium bath with dialysis.  Subjective:   Denies fever, chills, nausea, anorexia or dyspnea. BP better controlled.    Objective:   BP (!) 145/107    Pulse 97    Temp 98.2 F (36.8 C) (Oral)    Resp 14    Ht 5\' 11"  (1.803 m)    Wt 106.8 kg    SpO2 92%    BMI 32.84 kg/m   Intake/Output Summary (Last 24 hours) at 02/14/2021 0703 Last data filed at 02/13/2021 2311 Gross per 24 hour  Intake --  Output 1100 ml  Net -1100 ml   Weight change:   Physical Exam: Constitutional:  lying in bed, no distress ENMT: ears and nose without scars or lesions CV: normal rate, trace edema in the bilateral lower extremities Respiratory: bilateral chest rise with increased work of breathing Gastrointestinal: soft, non-tender, no palpable masses or hernias Skin: no visible lesions or rashes Psych: alert, judgement/insight appropriate, appropriate mood and affect Access: RIJ TC   Imaging: No results found.  Labs: BMET Recent Labs  Lab 02/08/21 0112 02/09/21 0121 02/10/21 0115 02/11/21 0246 02/12/21 0049 02/13/21 0136 02/14/21 0158  NA 131* 132* 133* 133* 134* 136 138  K 3.7 4.1 4.0 4.3 4.9 4.5 4.1  CL 97* 97* 99 96* 98 99 100  CO2 18* 19* 23 21* 22 26 24   GLUCOSE 126* 115* 127* 130* 97 97 132*  BUN 148* 113* 73* 90* 100* 53* 61*  CREATININE 17.90* 14.40* 11.12* 12.55* 13.25* 7.76* 8.32*  CALCIUM 6.9* 7.7* 8.0* 8.3* 8.6* 9.1 10.3  PHOS 6.4* 7.0* 6.4* 9.1* 10.4* 8.4* 9.8*   CBC Recent Labs  Lab 02/11/21 0246 02/12/21 0049 02/13/21 0136 02/14/21 0158  WBC 10.1 9.9 8.7 10.2  HGB 9.0* 9.0* 8.5* 8.9*  HCT 26.4* 26.6* 25.2* 27.3*  MCV 86.8 87.8 88.7 89.8  PLT 309 345 346 367    Medications:      amLODipine  10 mg Oral Daily   carvedilol  12.5 mg Oral BID WC   Chlorhexidine Gluconate Cloth  6 each Topical Q0600   heparin injection (subcutaneous)  5,000 Units Subcutaneous Q8H   hydrALAZINE  50 mg Oral Q6H      Otelia Santee, MD 02/14/2021, 7:03 AM

## 2021-02-14 NOTE — Progress Notes (Signed)
Physical Therapy Treatment Patient Details Name: Erik Griffith MRN: [id number] DOB: 1985/03/02 Today's Date: 02/14/2021   History of Present Illness Pt is a 36 y.o. M who presents 02/06/2021 with generalized weakness and fatigue, work up significant for severe renal failure, with creatinine of 23. Possibly related to rhabdomyolysis with diffuse viral myositis plus cocaine versus primary GN. No significant PMH.    PT Comments    Pt limited by headache and reports of mild dizziness during ambulation. Pt with hypertension noted throughout the day and reports a headache at rest and with movement. PT encourages continued mobility, with ambulation out of the room multiple times daily in an effort to maintain independence. PT reduces frequency to 3x/week as pt is progressing well ans has the capability to ambulate with assistance of family.   Recommendations for follow up therapy are one component of a multi-disciplinary discharge planning process, led by the attending physician.  Recommendations may be updated based on patient status, additional functional criteria and insurance authorization.  Follow Up Recommendations  No PT follow up     Assistance Recommended at Discharge PRN  Patient can return home with the following     Equipment Recommendations  None recommended by PT    Recommendations for Other Services       Precautions / Restrictions Precautions Precautions: Fall Precaution Comments: monitor BP, 186/108 (129) immediately post-ambulation Restrictions Weight Bearing Restrictions: No     Mobility  Bed Mobility Overal bed mobility: Modified Independent             General bed mobility comments: HOB elevated    Transfers Overall transfer level: Independent                      Ambulation/Gait Ambulation/Gait assistance: Supervision Gait Distance (Feet): 250 Feet Assistive device: None Gait Pattern/deviations: Step-through pattern;Drifts right/left Gait  velocity: decreased Gait velocity interpretation: 1.31 - 2.62 ft/sec, indicative of limited community ambulator   General Gait Details: pt with intermittent lateral drift but no overt losses of balance   Stairs Stairs:  (pt declines stair training due to headache)           Wheelchair Mobility    Modified Rankin (Stroke Patients Only)       Balance Overall balance assessment: Needs assistance Sitting-balance support: No upper extremity supported;Feet supported Sitting balance-Leahy Scale: Good     Standing balance support: No upper extremity supported;During functional activity Standing balance-Leahy Scale: Good                              Cognition Arousal/Alertness: Awake/alert Behavior During Therapy: WFL for tasks assessed/performed Overall Cognitive Status: Within Functional Limits for tasks assessed                                          Exercises      General Comments General comments (skin integrity, edema, etc.): tachy into 110s, BP of 186/108 (129) after ambulation. SpO2 with unreliable reading during gait, 98% when seated in bed at end of walk      Pertinent Vitals/Pain Pain Assessment: Faces Faces Pain Scale: Hurts little more Pain Location: head Pain Descriptors / Indicators: Headache Pain Intervention(s): Monitored during session    Home Living  Prior Function            PT Goals (current goals can now be found in the care plan section) Acute Rehab PT Goals Patient Stated Goal: to walk and get back to work Progress towards PT goals: Progressing toward goals    Frequency    Min 3X/week      PT Plan Frequency needs to be updated    Co-evaluation              AM-PAC PT "6 Clicks" Mobility   Outcome Measure  Help needed turning from your back to your side while in a flat bed without using bedrails?: None Help needed moving from lying on your back to sitting  on the side of a flat bed without using bedrails?: None Help needed moving to and from a bed to a chair (including a wheelchair)?: None Help needed standing up from a chair using your arms (e.g., wheelchair or bedside chair)?: None Help needed to walk in hospital room?: A Little Help needed climbing 3-5 steps with a railing? : A Little 6 Click Score: 22    End of Session   Activity Tolerance: Patient limited by pain (headache) Patient left: in bed;with call bell/phone within reach Nurse Communication: Mobility status PT Visit Diagnosis: Other abnormalities of gait and mobility (R26.89);Difficulty in walking, not elsewhere classified (R26.2)     Time: [id number] PT Time Calculation (min) (ACUTE ONLY): 10 min  Charges:  $Gait Training: 8-22 mins                     Zenaida Niece, PT, DPT Acute Rehabilitation Pager: [phone] Office [place] 02/14/2021, 3:03 PM

## 2021-02-14 NOTE — Plan of Care (Signed)
  Problem: Pain Managment: Goal: General experience of comfort will improve Outcome: Progressing   Problem: Safety: Goal: Ability to remain free from injury will improve Outcome: Progressing   

## 2021-02-14 NOTE — Progress Notes (Signed)
PROGRESS NOTE        PATIENT DETAILS Name: Erik Griffith Age: 36 y.o. Sex: male Date of Birth: February 25, 1985 Admit Date: 02/06/2021 Admitting Physician Vernelle Emerald, MD [id number], No Pcp Per (Inactive)  Brief Narrative: Patient is a 36 y.o. male with history of HTN-who presented with fatigue/weakness-found to have new onset AKI.  See below for further details.  Subjective: No chest pain or shortness of breath.  Lying comfortably in bed.  No nausea vomiting or diarrhea.  Objective: Vitals: Blood pressure (!) 175/106, pulse 96, temperature 97.8 F (36.6 C), temperature source Oral, resp. rate 17, height 5\' 11"  (1.803 m), weight 106.8 kg, SpO2 96 %.   Exam: Gen Exam:Alert awake-not in any distress HEENT:atraumatic, normocephalic Chest: B/L clear to auscultation anteriorly CVS:S1S2 regular Abdomen:soft non tender, non distended Extremities:no edema Neurology: Non focal Skin: no rash   Pertinent Labs/Radiology: Recent Labs  Lab 02/14/21 0158  WBC 10.2  HGB 8.9*  PLT 367  NA 138  K 4.1  CREATININE 8.32*     Assessment/Plan: AKI: Thought to be tubular injury in the setting of rhabdomyolysis from cocaine use.  Autoimmune serology/SPEP negative so far.  Renal function essentially stable-nephrology planning on CT-guided renal biopsy tomorrow.  Has required intermittent hemodialysis-nephrology planning to continue to watch closely and dialyze as needed.  Continue to watch for any signs of renal recovery.  Normocytic anemia: Due to AKI/acute illness: No indication to transfuse PRBC at this point-follow closely.  Rhabdomyolysis: CK has significantly improved-likely due to cocaine use.  Myositis: Due to cocaine use-likely causing rhabdomyolysis.  Myositis panel still pending.  HTN: BP on the higher side-increase Coreg to 25 mg twice daily, continue Norvasc and hydralazine.   Cocaine use: Counseling prior to discharge  Obesity: Estimated body  mass index is 32.84 kg/m as calculated from the following:   Height as of this encounter: 5\' 11"  (1.803 m).   Weight as of this encounter: 106.8 kg.   Procedures: None Consults: Renal, neurology DVT Prophylaxis: SQ Heparin Code Status:Full code  Family Communication: None at bedside  Time spent: 25- minutes-Greater than 50% of this time was spent in counseling, explanation of diagnosis, planning of further management, and coordination of care.   Disposition Plan: Status is: Inpatient  Remains inpatient appropriate because: AKI-requiring intermittent HD-awaiting renal recovery.    Diet: Diet Order             Diet NPO time specified Except for: Sips with Meds  Diet effective midnight           Diet 2 gram sodium Room service appropriate? Yes; Fluid consistency: Thin; Fluid restriction: 1800 mL Fluid  Diet effective now                     Antimicrobial agents: Anti-infectives (From admission, onward)    Start     Dose/Rate Route Frequency Ordered Stop   02/07/21 1400  ceFAZolin (ANCEF) IVPB 2g/100 mL premix  Status:  Discontinued        2 g 200 mL/hr over 30 Minutes Intravenous Every 8 hours 02/07/21 1219 02/07/21 1220   02/07/21 1315  ceFAZolin (ANCEF) IVPB 2g/100 mL premix        2 g 200 mL/hr over 30 Minutes Intravenous  Once 02/07/21 1220 02/07/21 1250        MEDICATIONS: Scheduled Meds:  amLODipine  10 mg Oral Daily   carvedilol  12.5 mg Oral BID WC   Chlorhexidine Gluconate Cloth  6 each Topical Q0600   heparin injection (subcutaneous)  5,000 Units Subcutaneous Q8H   hydrALAZINE  50 mg Oral Q6H   Continuous Infusions: PRN Meds:.acetaminophen **OR** acetaminophen, albuterol, hydrALAZINE, metoprolol tartrate, ondansetron **OR** ondansetron (ZOFRAN) IV, oxyCODONE, senna-docusate, traZODone   I have personally reviewed following labs and imaging studies  LABORATORY DATA: CBC: Recent Labs  Lab 02/10/21 0115 02/11/21 0246 02/12/21 0049  02/13/21 0136 02/14/21 0158  WBC 9.2 10.1 9.9 8.7 10.2  HGB 9.4* 9.0* 9.0* 8.5* 8.9*  HCT 26.3* 26.4* 26.6* 25.2* 27.3*  MCV 84.6 86.8 87.8 88.7 89.8  PLT 243 309 345 346 367     Basic Metabolic Panel: Recent Labs  Lab 02/10/21 0115 02/11/21 0246 02/12/21 0049 02/13/21 0136 02/14/21 0158  NA 133* 133* 134* 136 138  K 4.0 4.3 4.9 4.5 4.1  CL 99 96* 98 99 100  CO2 23 21* 22 26 24   GLUCOSE 127* 130* 97 97 132*  BUN 73* 90* 100* 53* 61*  CREATININE 11.12* 12.55* 13.25* 7.76* 8.32*  CALCIUM 8.0* 8.3* 8.6* 9.1 10.3  PHOS 6.4* 9.1* 10.4* 8.4* 9.8*     GFR: Estimated Creatinine Clearance: 15.4 mL/min (A) (by C-G formula based on SCr of 8.32 mg/dL (H)).  Liver Function Tests: Recent Labs  Lab 02/10/21 0115 02/11/21 0246 02/12/21 0049 02/13/21 0136 02/14/21 0158  ALBUMIN 2.6* 2.7* 2.7* 2.7* 2.8*    No results for input(s): LIPASE, AMYLASE in the last 168 hours. No results for input(s): AMMONIA in the last 168 hours.  Coagulation Profile: Recent Labs  Lab 02/08/21 0112 02/12/21 0049  INR 1.2 0.9     Cardiac Enzymes: Recent Labs  Lab 02/08/21 0112 02/09/21 0121 02/10/21 0115 02/11/21 0246  CKTOTAL 4,934* 2,457* 1,071* 650*     BNP (last 3 results) No results for input(s): PROBNP in the last 8760 hours.  Lipid Profile: No results for input(s): CHOL, HDL, LDLCALC, TRIG, CHOLHDL, LDLDIRECT in the last 72 hours.  Thyroid Function Tests: No results for input(s): TSH, T4TOTAL, FREET4, T3FREE, THYROIDAB in the last 72 hours.  Anemia Panel: No results for input(s): VITAMINB12, FOLATE, FERRITIN, TIBC, IRON, RETICCTPCT in the last 72 hours.  Urine analysis:    Component Value Date/Time   COLORURINE STRAW (A) 02/08/2021 0740   APPEARANCEUR CLEAR 02/08/2021 0740   LABSPEC 1.009 02/08/2021 0740   PHURINE 5.0 02/08/2021 0740   GLUCOSEU NEGATIVE 02/08/2021 0740   HGBUR MODERATE (A) 02/08/2021 0740   BILIRUBINUR NEGATIVE 02/08/2021 0740   KETONESUR  NEGATIVE 02/08/2021 0740   PROTEINUR 30 (A) 02/08/2021 0740   NITRITE NEGATIVE 02/08/2021 0740   LEUKOCYTESUR NEGATIVE 02/08/2021 0740    Sepsis Labs: Lactic Acid, Venous No results found for: LATICACIDVEN  MICROBIOLOGY: Recent Results (from the past 240 hour(s))  Resp Panel by RT-PCR (Flu A&B, Covid) Nasopharyngeal Swab     Status: None   Collection Time: 02/06/21  7:33 PM   Specimen: Nasopharyngeal Swab; Nasopharyngeal(NP) swabs in vial transport medium  Result Value Ref Range Status   SARS Coronavirus 2 by RT PCR NEGATIVE NEGATIVE Final    Comment: (NOTE) SARS-CoV-2 target nucleic acids are NOT DETECTED.  The SARS-CoV-2 RNA is generally detectable in upper respiratory specimens during the acute phase of infection. The lowest concentration of SARS-CoV-2 viral copies this assay can detect is 138 copies/mL. A negative result does not preclude SARS-Cov-2 infection and should not  be used as the sole basis for treatment or other patient management decisions. A negative result may occur with  improper specimen collection/handling, submission of specimen other than nasopharyngeal swab, presence of viral mutation(s) within the areas targeted by this assay, and inadequate number of viral copies(<138 copies/mL). A negative result must be combined with clinical observations, patient history, and epidemiological information. The expected result is Negative.  Fact Sheet for Patients:  EntrepreneurPulse.com.au  Fact Sheet for Healthcare Providers:  IncredibleEmployment.be  This test is no t yet approved or cleared by the [place] FDA and  has been authorized for detection and/or diagnosis of SARS-CoV-2 by FDA under an Emergency Use Authorization (EUA). This EUA will remain  in effect (meaning this test can be used) for the duration of the COVID-19 declaration under Section 564(b)(1) of the Act, 21 U.S.C.section 360bbb-3(b)(1), unless the  authorization is terminated  or revoked sooner.       Influenza A by PCR NEGATIVE NEGATIVE Final   Influenza B by PCR NEGATIVE NEGATIVE Final    Comment: (NOTE) The Xpert Xpress SARS-CoV-2/FLU/RSV plus assay is intended as an aid in the diagnosis of influenza from Nasopharyngeal swab specimens and should not be used as a sole basis for treatment. Nasal washings and aspirates are unacceptable for Xpert Xpress SARS-CoV-2/FLU/RSV testing.  Fact Sheet for Patients: EntrepreneurPulse.com.au  Fact Sheet for Healthcare Providers: IncredibleEmployment.be  This test is not yet approved or cleared by the [place] FDA and has been authorized for detection and/or diagnosis of SARS-CoV-2 by FDA under an Emergency Use Authorization (EUA). This EUA will remain in effect (meaning this test can be used) for the duration of the COVID-19 declaration under Section 564(b)(1) of the Act, 21 U.S.C. section 360bbb-3(b)(1), unless the authorization is terminated or revoked.  Performed at [facility], [address] [place], [place] [address]     RADIOLOGY STUDIES/RESULTS: No results found.   LOS: 7 days   Oren Binet, MD  [facility]    To contact the attending provider between 7A-7P or the covering provider during after hours 7P-7A, please log into the web site [email or web address] and access using universal [place] password for that web site. If you do not have the password, please call the hospital operator.  02/14/2021, 12:25 PM

## 2021-02-14 NOTE — Progress Notes (Signed)
Rounded on patient today in correlation to acute kidney injury and questionable necessity for outpatient HD. Patient has absorbed a lot of information regarding his kidney's and is a bit overwhelmed. Patient calls his wife, Jeanette Caprice, over the phone so she can be present as well. Patient expresses concern for how long he will need the tunneled dialysis catheter in place. Spoke with patient at length about the differences of needing the catheter for acute dialysis versus the need of more permanent placement if his kidneys's did not recover. Also spoke with the patient about various options for hemodialysis and his need to discuss these options further with his nephrologist should his diagnosis progress. Patient slightly hesitant about scheduled biopsy tomorrow. Educated patient and his spouse on the process of having the biopsy done. Patient expresses concern as his blood pressure remains elevated. Reassured patient that they will ensure this is within a safe limit before proceeding with a biopsy. Patient also given Kidney Failure book for him and his spouse to review. Patient with no further questions at this time. Handouts and contact information provided to patient for any further assistance. Will follow as appropriate.   Dorthey Sawyer, RN  Dialysis Nurse Coordinator Phone: [phone]

## 2021-02-14 NOTE — Progress Notes (Signed)
Met with pt at bedside. Pt had wife on speaker phone for conversation as well. Pt informed navigator that he met with financial staff member yesterday and was advised he was approved for medicaid. Pt agreeable to navigator contacting financial staff, [place] [phone]), to inquire further regarding medicaid status. Message left for [place] and will await return call. Explained to pt and pt's wife that at this time since pt's diagnosis is AKI and medicaid is pending, out-pt HD is not an option. Will continue to follow and assist.   Melven Sartorius Renal Navigator [phone]

## 2021-02-15 ENCOUNTER — Inpatient Hospital Stay (HOSPITAL_COMMUNITY): Payer: Medicaid Other

## 2021-02-15 LAB — RENAL FUNCTION PANEL
Albumin: 3 g/dL — ABNORMAL LOW (ref 3.5–5.0)
Anion gap: 12 (ref 5–15)
BUN: 66 mg/dL — ABNORMAL HIGH (ref 6–20)
CO2: 28 mmol/L (ref 22–32)
Calcium: 11.3 mg/dL — ABNORMAL HIGH (ref 8.9–10.3)
Chloride: 98 mmol/L (ref 98–111)
Creatinine, Ser: 8.77 mg/dL — ABNORMAL HIGH (ref 0.61–1.24)
GFR, Estimated: 7 mL/min — ABNORMAL LOW (ref >60)
Glucose, Bld: 123 mg/dL — ABNORMAL HIGH (ref 70–99)
Phosphorus: 10.3 mg/dL — ABNORMAL HIGH (ref 2.5–4.6)
Potassium: 4.4 mmol/L (ref 3.5–5.1)
Sodium: 138 mmol/L (ref 135–145)

## 2021-02-15 LAB — CBC
HCT: 27.4 % — ABNORMAL LOW (ref 39.0–52.0)
Hemoglobin: 9.1 g/dL — ABNORMAL LOW (ref 13.0–17.0)
MCH: 29.4 pg (ref 26.0–34.0)
MCHC: 33.2 g/dL (ref 30.0–36.0)
MCV: 88.7 fL (ref 80.0–100.0)
Platelets: 387 10*3/uL (ref 150–400)
RBC: 3.09 MIL/uL — ABNORMAL LOW (ref 4.22–5.81)
RDW: 13 % (ref 11.5–15.5)
WBC: 11 10*3/uL — ABNORMAL HIGH (ref 4.0–10.5)
nRBC: 0 % (ref 0.0–0.2)

## 2021-02-15 LAB — PROTIME-INR
INR: 1.1 (ref 0.8–1.2)
Prothrombin Time: 13.8 seconds (ref 11.4–15.2)

## 2021-02-15 IMAGING — US US BIOPSY
1 series · 7 of 7 positions shown · non-contrast
Comparison: US Renal, [DATE].

INDICATION: Severe YARI.

EXAM:
ULTRASOUND GUIDED RENAL BIOPSY

[Series 1: ir (id) (id)/(id)/(id) · 7 of 7 slices shown]
[im 1/7]
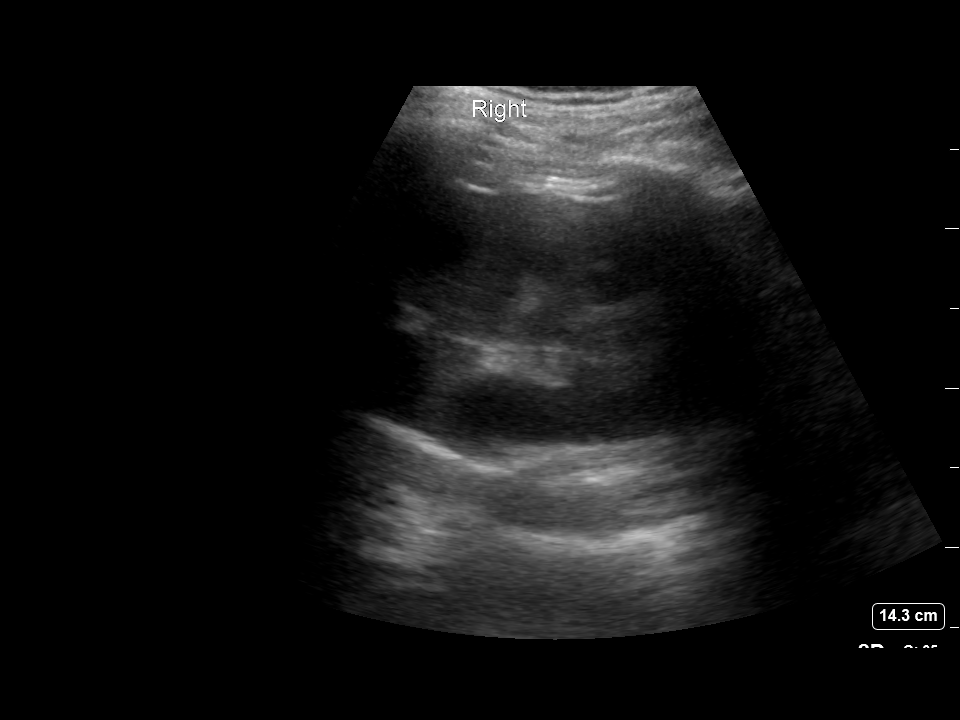
[im 2/7]
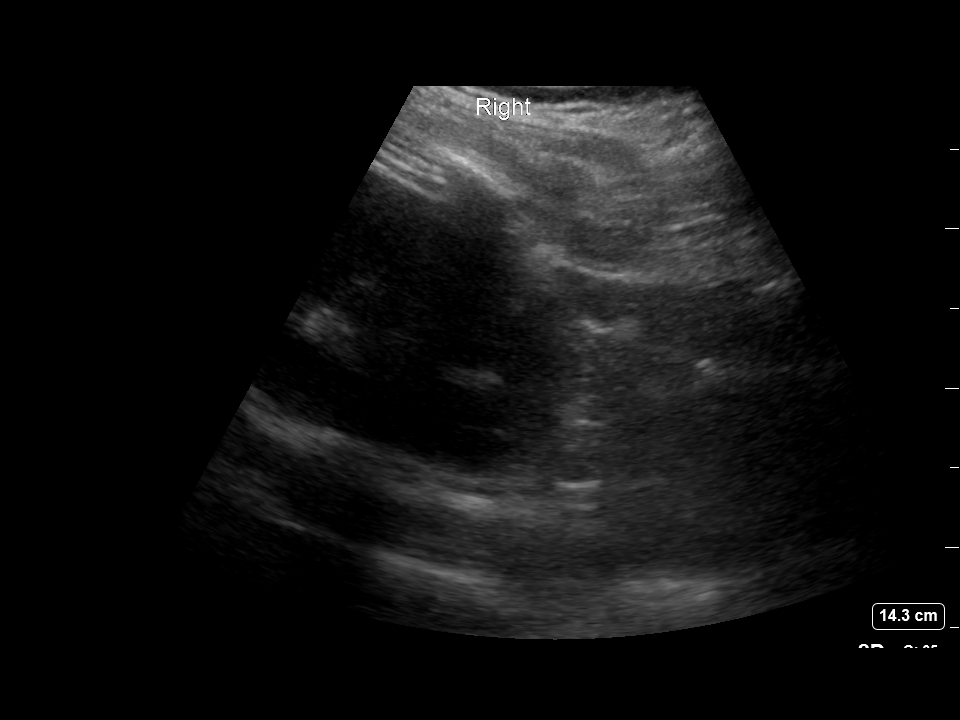
[im 3/7]
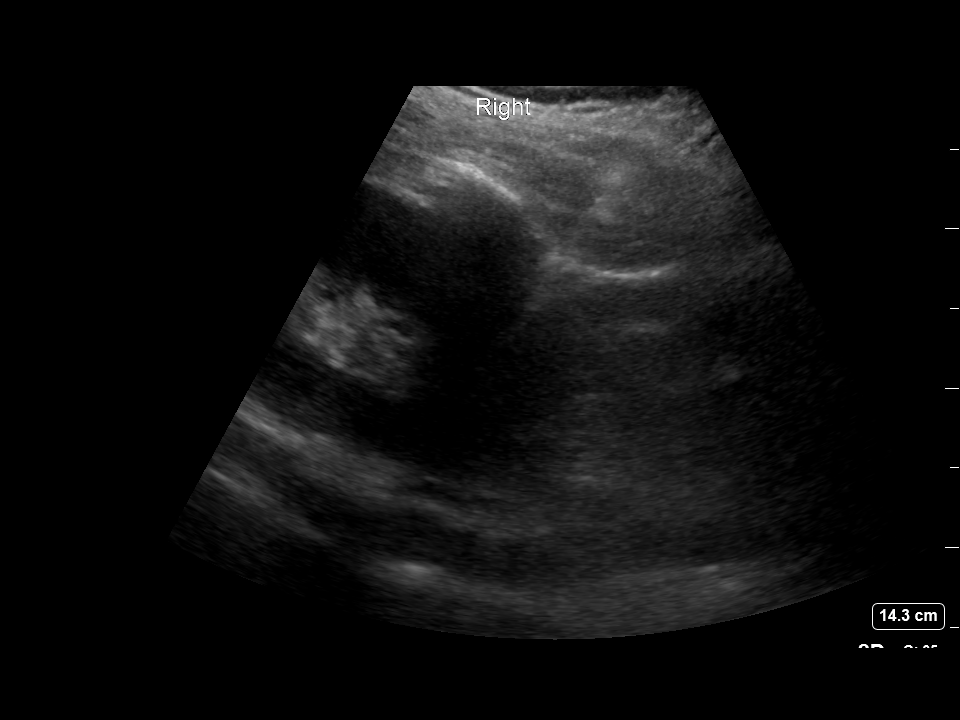
[im 4/7]
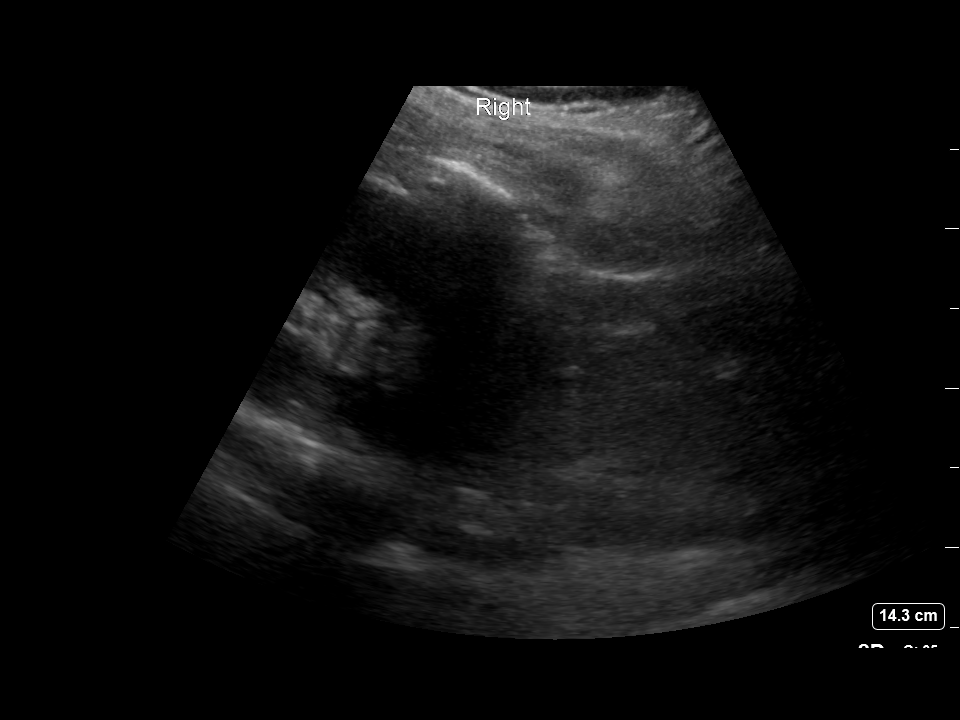
[im 5/7]
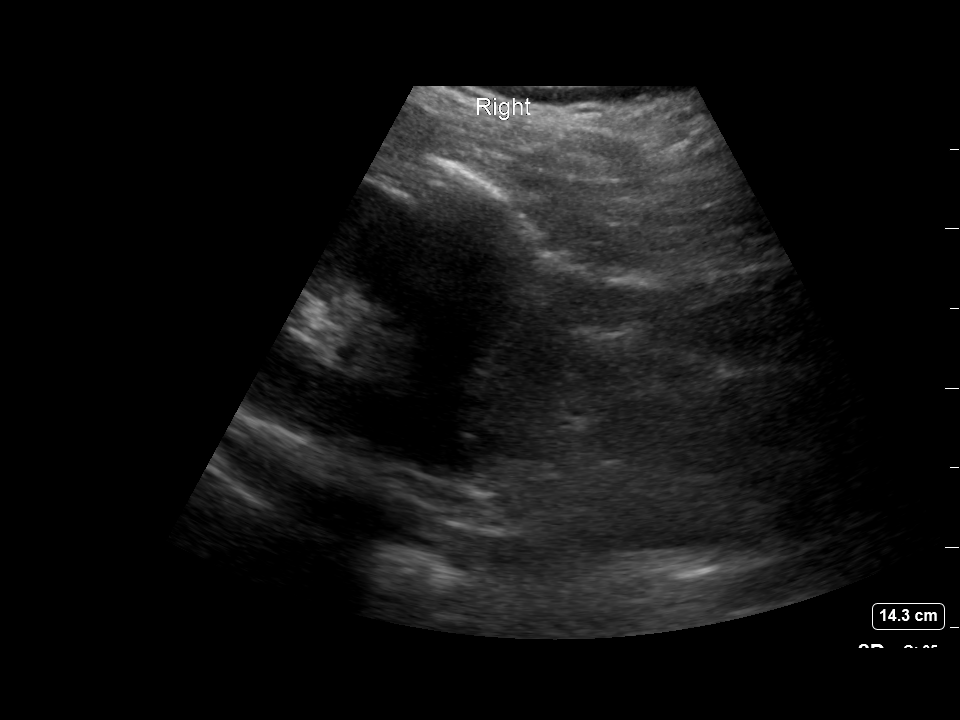
[im 6/7]
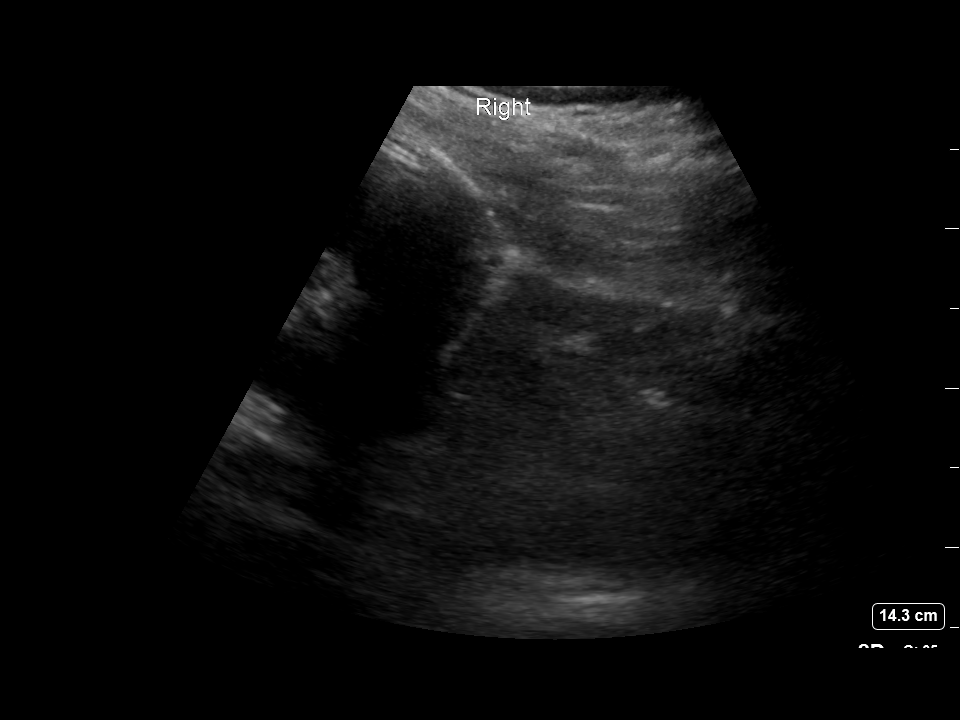
[im 7/7]
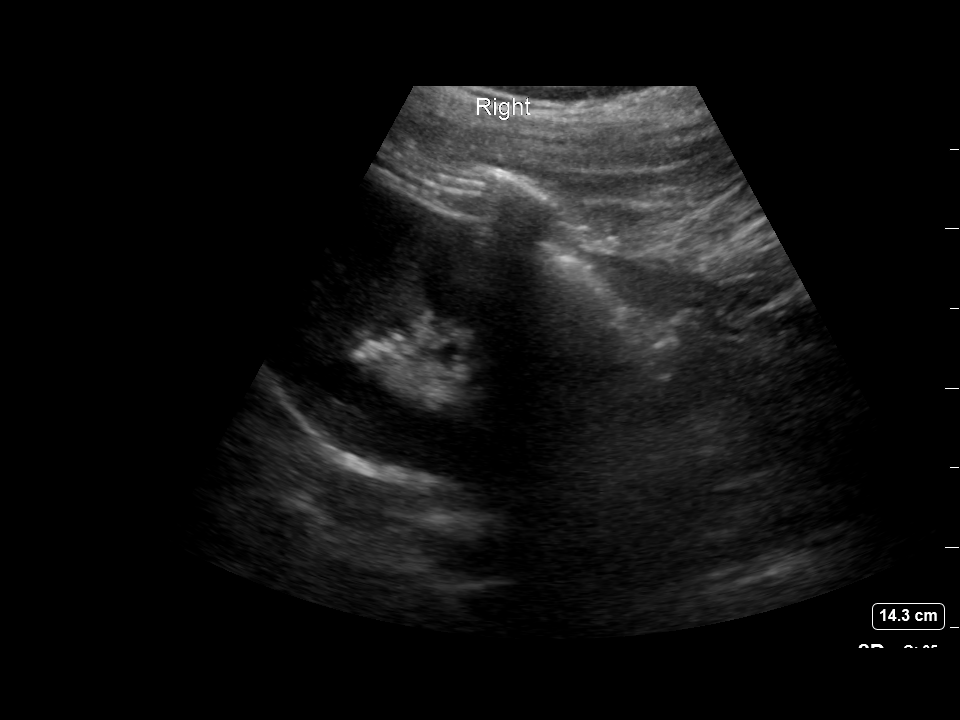

[7 of 7 positions shown; findings below may reference images not displayed]

MEDICATIONS:
None.

ANESTHESIA/SEDATION:
Fentanyl 100 mcg IV; Versed 2.5 mg IV

Total Moderate Sedation time: 24 minutes; The patient was
continuously monitored during the procedure by the interventional
radiology nurse under my direct supervision.

COMPLICATIONS:
None immediate.

PROCEDURE:
Informed written consent was obtained from the patient and/or
patient's representative after a discussion of the risks, benefits
and alternatives to treatment. The patient understands and consents
the procedure. A timeout was performed prior to the initiation of
the procedure.

Ultrasound scanning was performed of the bilateral flanks. The
inferior pole of the RIGHT kidney was selected for biopsy due to
location and sonographic window. The procedure was planned. The
operative site was prepped and draped in the usual sterile fashion.
The overlying soft tissues were anesthetized with 1% lidocaine with
epinephrine. A 17 gauge core needle biopsy device was advanced into
the inferior cortex of the RIGHT kidney and 3 core biopsies were
obtained under direct ultrasound guidance. Images were saved for
documentation purposes. The biopsy device was removed and hemostasis
was obtained with the injection of Gelfoam slurry.

Post procedural scanning was negative for significant post
procedural hemorrhage or additional complication. A dressing was
placed. The patient tolerated the procedure well without immediate
post procedural complication.
IMPRESSION: Successful ultrasound-guided RIGHT non-targeted renal biopsy, as
above.

## 2021-02-15 MED ORDER — GELATIN ABSORBABLE 12-7 MM EX MISC
CUTANEOUS | Status: AC
  Filled 2021-02-15: qty 1

## 2021-02-15 MED ORDER — SEVELAMER CARBONATE 800 MG PO TABS
2400.0000 mg | ORAL_TABLET | Freq: Three times a day (TID) | ORAL | Status: DC
  Administered 2021-02-15 – 2021-02-17 (×5): 2400 mg via ORAL
  Filled 2021-02-15 (×5): qty 3

## 2021-02-15 MED ORDER — HYDRALAZINE HCL 20 MG/ML IJ SOLN
INTRAMUSCULAR | Status: AC | PRN
  Administered 2021-02-15: 10 mg via INTRAVENOUS

## 2021-02-15 MED ORDER — LIDOCAINE HCL 1 % IJ SOLN
INTRAMUSCULAR | Status: AC
  Filled 2021-02-15: qty 20

## 2021-02-15 MED ORDER — FENTANYL CITRATE (PF) 100 MCG/2ML IJ SOLN
INTRAMUSCULAR | Status: AC
  Filled 2021-02-15: qty 4

## 2021-02-15 MED ORDER — MIDAZOLAM HCL 2 MG/2ML IJ SOLN
INTRAMUSCULAR | Status: AC | PRN
  Administered 2021-02-15: .5 mg via INTRAVENOUS
  Administered 2021-02-15 (×2): 1 mg via INTRAVENOUS

## 2021-02-15 MED ORDER — FENTANYL CITRATE (PF) 100 MCG/2ML IJ SOLN
INTRAMUSCULAR | Status: AC | PRN
  Administered 2021-02-15 (×2): 50 ug via INTRAVENOUS

## 2021-02-15 MED ORDER — HYDRALAZINE HCL 20 MG/ML IJ SOLN
INTRAMUSCULAR | Status: AC
  Filled 2021-02-15: qty 1

## 2021-02-15 MED ORDER — MIDAZOLAM HCL 2 MG/2ML IJ SOLN
INTRAMUSCULAR | Status: AC
  Filled 2021-02-15: qty 6

## 2021-02-15 NOTE — Progress Notes (Signed)
PROGRESS NOTE        PATIENT DETAILS Name: Erik Griffith Age: 36 y.o. Sex: male Date of Birth: [birth year] Admit Date: 02/06/2021 Admitting Physician Vernelle Emerald, MD QP:[id number], No Pcp Per (Inactive)  Brief Narrative: Patient is a 36 y.o. male with history of HTN-who presented with fatigue/weakness-found to have new onset AKI.  See below for further details.  Subjective: No major issues overnight.  Spouse at bedside.  Appears comfortable.  Objective: Vitals: Blood pressure (!) 148/87, pulse (!) 105, temperature 98.7 F (37.1 C), temperature source Oral, resp. rate 15, height 5\' 11"  (1.803 m), weight 106.8 kg, SpO2 97 %.   Exam: Gen Exam:Alert awake-not in any distress HEENT:atraumatic, normocephalic Chest: B/L clear to auscultation anteriorly CVS:S1S2 regular Abdomen:soft non tender, non distended Extremities:no edema Neurology: Non focal Skin: no rash   Pertinent Labs/Radiology: Recent Labs  Lab 02/15/21 0142  WBC 11.0*  HGB 9.1*  PLT 387  NA 138  K 4.4  CREATININE 8.77*     Assessment/Plan: AKI: Thought to be tubular injury in the setting of rhabdomyolysis from cocaine use.  Autoimmune serology/SPEP negative so far.  No significant improvement in renal function over the past few days-has had intermittent HD throughout this hospitalization-however nephrology planning on observation for now.  Renal biopsy planned for later today.  Continue to watch for signs of renal recovery.    Normocytic anemia: Due to AKI/acute illness: No indication to transfuse PRBC at this point-follow closely.  Rhabdomyolysis: CK has significantly improved-likely due to cocaine use.  Myositis: Due to cocaine use-likely causing rhabdomyolysis.  Myositis panel still pending.  HTN: BP mains on the higher side-Coreg was just increased yesterday-continue current dosing of Norvasc and hydralazine.  Reassess tomorrow.   Cocaine use: Counseling prior to  discharge  Obesity: Estimated body mass index is 32.84 kg/m as calculated from the following:   Height as of this encounter: 5\' 11"  (1.803 m).   Weight as of this encounter: 106.8 kg.   Procedures: None Consults: Renal, neurology DVT Prophylaxis: SQ Heparin Code Status:Full code  Family Communication: None at bedside  Time spent: 25- minutes-Greater than 50% of this time was spent in counseling, explanation of diagnosis, planning of further management, and coordination of care.   Disposition Plan: Status is: Inpatient  Remains inpatient appropriate because: AKI-requiring intermittent HD-awaiting renal recovery.    Diet: Diet Order             Diet NPO time specified Except for: Sips with Meds  Diet effective midnight                     Antimicrobial agents: Anti-infectives (From admission, onward)    Start     Dose/Rate Route Frequency Ordered Stop   02/07/21 1400  ceFAZolin (ANCEF) IVPB 2g/100 mL premix  Status:  Discontinued        2 g 200 mL/hr over 30 Minutes Intravenous Every 8 hours 02/07/21 1219 02/07/21 1220   02/07/21 1315  ceFAZolin (ANCEF) IVPB 2g/100 mL premix        2 g 200 mL/hr over 30 Minutes Intravenous  Once 02/07/21 1220 02/07/21 1250        MEDICATIONS: Scheduled Meds:  amLODipine  10 mg Oral Daily   carvedilol  25 mg Oral BID WC   Chlorhexidine Gluconate Cloth  6 each Topical Q0600  gelatin adsorbable       gelatin adsorbable       heparin injection (subcutaneous)  5,000 Units Subcutaneous Q8H   hydrALAZINE  50 mg Oral Q6H   lidocaine       sevelamer carbonate  2,400 mg Oral TID WC   Continuous Infusions: PRN Meds:.acetaminophen **OR** acetaminophen, albuterol, fentaNYL, hydrALAZINE, hydrALAZINE, metoprolol tartrate, midazolam, ondansetron **OR** ondansetron (ZOFRAN) IV, oxyCODONE, senna-docusate, traZODone   I have personally reviewed following labs and imaging studies  LABORATORY DATA: CBC: Recent Labs  Lab  02/11/21 0246 02/12/21 0049 02/13/21 0136 02/14/21 0158 02/15/21 0142  WBC 10.1 9.9 8.7 10.2 11.0*  HGB 9.0* 9.0* 8.5* 8.9* 9.1*  HCT 26.4* 26.6* 25.2* 27.3* 27.4*  MCV 86.8 87.8 88.7 89.8 88.7  PLT 309 345 346 367 387     Basic Metabolic Panel: Recent Labs  Lab 02/11/21 0246 02/12/21 0049 02/13/21 0136 02/14/21 0158 02/15/21 0142  NA 133* 134* 136 138 138  K 4.3 4.9 4.5 4.1 4.4  CL 96* 98 99 100 98  CO2 21* 22 26 24 28   GLUCOSE 130* 97 97 132* 123*  BUN 90* 100* 53* 61* 66*  CREATININE 12.55* 13.25* 7.76* 8.32* 8.77*  CALCIUM 8.3* 8.6* 9.1 10.3 11.3*  PHOS 9.1* 10.4* 8.4* 9.8* 10.3*     GFR: Estimated Creatinine Clearance: 14.6 mL/min (A) (by C-G formula based on SCr of 8.77 mg/dL (H)).  Liver Function Tests: Recent Labs  Lab 02/11/21 0246 02/12/21 0049 02/13/21 0136 02/14/21 0158 02/15/21 0142  ALBUMIN 2.7* 2.7* 2.7* 2.8* 3.0*    No results for input(s): LIPASE, AMYLASE in the last 168 hours. No results for input(s): AMMONIA in the last 168 hours.  Coagulation Profile: Recent Labs  Lab 02/12/21 0049 02/15/21 0142  INR 0.9 1.1     Cardiac Enzymes: Recent Labs  Lab 02/09/21 0121 02/10/21 0115 02/11/21 0246  CKTOTAL 2,457* 1,071* 650*     BNP (last 3 results) No results for input(s): PROBNP in the last 8760 hours.  Lipid Profile: No results for input(s): CHOL, HDL, LDLCALC, TRIG, CHOLHDL, LDLDIRECT in the last 72 hours.  Thyroid Function Tests: No results for input(s): TSH, T4TOTAL, FREET4, T3FREE, THYROIDAB in the last 72 hours.  Anemia Panel: No results for input(s): VITAMINB12, FOLATE, FERRITIN, TIBC, IRON, RETICCTPCT in the last 72 hours.  Urine analysis:    Component Value Date/Time   COLORURINE STRAW (A) 02/08/2021 0740   APPEARANCEUR CLEAR 02/08/2021 0740   LABSPEC 1.009 02/08/2021 0740   PHURINE 5.0 02/08/2021 0740   GLUCOSEU NEGATIVE 02/08/2021 0740   HGBUR MODERATE (A) 02/08/2021 0740   BILIRUBINUR NEGATIVE 02/08/2021  0740   KETONESUR NEGATIVE 02/08/2021 0740   PROTEINUR 30 (A) 02/08/2021 0740   NITRITE NEGATIVE 02/08/2021 0740   LEUKOCYTESUR NEGATIVE 02/08/2021 0740    Sepsis Labs: Lactic Acid, Venous No results found for: LATICACIDVEN  MICROBIOLOGY: Recent Results (from the past 240 hour(s))  Resp Panel by RT-PCR (Flu A&B, Covid) Nasopharyngeal Swab     Status: None   Collection Time: 02/06/21  7:33 PM   Specimen: Nasopharyngeal Swab; Nasopharyngeal(NP) swabs in vial transport medium  Result Value Ref Range Status   SARS Coronavirus 2 by RT PCR NEGATIVE NEGATIVE Final    Comment: (NOTE) SARS-CoV-2 target nucleic acids are NOT DETECTED.  The SARS-CoV-2 RNA is generally detectable in upper respiratory specimens during the acute phase of infection. The lowest concentration of SARS-CoV-2 viral copies this assay can detect is 138 copies/mL. A negative result does not  preclude SARS-Cov-2 infection and should not be used as the sole basis for treatment or other patient management decisions. A negative result may occur with  improper specimen collection/handling, submission of specimen other than nasopharyngeal swab, presence of viral mutation(s) within the areas targeted by this assay, and inadequate number of viral copies(<138 copies/mL). A negative result must be combined with clinical observations, patient history, and epidemiological information. The expected result is Negative.  Fact Sheet for Patients:  EntrepreneurPulse.com.au  Fact Sheet for Healthcare Providers:  IncredibleEmployment.be  This test is no t yet approved or cleared by the [place] FDA and  has been authorized for detection and/or diagnosis of SARS-CoV-2 by FDA under an Emergency Use Authorization (EUA). This EUA will remain  in effect (meaning this test can be used) for the duration of the COVID-19 declaration under Section 564(b)(1) of the Act, 21 U.S.C.section 360bbb-3(b)(1),  unless the authorization is terminated  or revoked sooner.       Influenza A by PCR NEGATIVE NEGATIVE Final   Influenza B by PCR NEGATIVE NEGATIVE Final    Comment: (NOTE) The Xpert Xpress SARS-CoV-2/FLU/RSV plus assay is intended as an aid in the diagnosis of influenza from Nasopharyngeal swab specimens and should not be used as a sole basis for treatment. Nasal washings and aspirates are unacceptable for Xpert Xpress SARS-CoV-2/FLU/RSV testing.  Fact Sheet for Patients: EntrepreneurPulse.com.au  Fact Sheet for Healthcare Providers: IncredibleEmployment.be  This test is not yet approved or cleared by the [place] FDA and has been authorized for detection and/or diagnosis of SARS-CoV-2 by FDA under an Emergency Use Authorization (EUA). This EUA will remain in effect (meaning this test can be used) for the duration of the COVID-19 declaration under Section 564(b)(1) of the Act, 21 U.S.C. section 360bbb-3(b)(1), unless the authorization is terminated or revoked.  Performed at [facility], [address], [place], [place] [address]     RADIOLOGY STUDIES/RESULTS: No results found.   LOS: 8 days   Oren Binet, MD  [facility]    To contact the attending provider between 7A-7P or the covering provider during after hours 7P-7A, please log into the web site [email or web address] and access using universal [place] password for that web site. If you do not have the password, please call the hospital operator.  02/15/2021, 11:49 AM

## 2021-02-15 NOTE — Progress Notes (Signed)
Pt continues to refuse the heparin shot; wife and patient aware of the benefit and risk of not taking the shot.

## 2021-02-15 NOTE — TOC Initial Note (Signed)
Transition of Care [facility]) - Initial/Assessment Note    Patient Details  Name: Erik Griffith MRN: [id number] Date of Birth: 05/29/1985  [facility] of [facility]) CM/SW Contact:    Beckie Busing, RN Phone [phone]  02/15/2021, 3:18 PM  Clinical Narrative:                 Patient from home with spouse. Per spouse they live in an apartment on the second level. Patient was independent. no DME no HH prior to admission.  Does not have PCP or pharmacy. Will need TOC follow up for PCP and match for meds upon d/c.  Expected Discharge Plan: Home/Self Care Barriers to Discharge: Continued Medical Work up   Patient Goals and CMS Choice Patient states their goals for this hospitalization and ongoing recovery are:: Wants to get better to go home CMS Medicare.gov Compare Post Acute Care list provided to::  (n/a) Choice offered to / list presented to :  (n/a)  Expected Discharge Plan and Services Expected Discharge Plan: Home/Self Care In-house Referral: NA Discharge Planning Services: CM Consult   Living arrangements for the past 2 months: Apartment (2nd level)                 DME Arranged: N/A DME Agency: NA       HH Arranged: NA HH Agency: NA        Prior Living Arrangements/Services Living arrangements for the past 2 months: Apartment (2nd level) Lives with:: Spouse Patient language and need for interpreter reviewed:: Yes Do you feel safe going back to the place where you live?: Yes      Need for Family Participation in Patient Care: Yes (Comment) Care giver support system in place?: Yes (comment) Current home services:  (n/a) Criminal Activity/Legal Involvement Pertinent to Current Situation/Hospitalization: No - Comment as needed  Activities of Daily Living Home Assistive Devices/Equipment: None ADL Screening (condition at time of admission) Patient's cognitive ability adequate to safely complete daily activities?: Yes Is the patient deaf or have difficulty  hearing?: No Does the patient have difficulty seeing, even when wearing glasses/contacts?: No Does the patient have difficulty concentrating, remembering, or making decisions?: No Patient able to express need for assistance with ADLs?: Yes Does the patient have difficulty dressing or bathing?: No Independently performs ADLs?: Yes (appropriate for developmental age) Does the patient have difficulty walking or climbing stairs?: No Weakness of Legs: Both Weakness of Arms/Hands: Both  Permission Sought/Granted Permission sought to share information with : Family Supports Permission granted to share information with : Yes, Verbal Permission Granted  Share Information with NAME: Erik Griffith     Permission granted to share info w Relationship: spouse  Permission granted to share info w Contact Information: [phone]  Emotional Assessment Appearance:: Appears stated age Attitude/Demeanor/Rapport: Other (comment) (sleepy spouse assisted with assessment) Affect (typically observed): Unable to Assess (he was sleepy unable to assess) Orientation: : Oriented to Self, Oriented to Place, Oriented to  Time, Oriented to Situation Alcohol / Substance Use: Not Applicable Psych Involvement: No (comment)  Admission diagnosis:  Hypocalcemia [E83.51] Non-traumatic rhabdomyolysis [M62.82] Acute renal failure, unspecified acute renal failure type (HCC) [N17.9] Hypertension, unspecified type [I10] Acute kidney injury (AKI) with acute tubular necrosis (ATN) (HCC) [N17.0] Patient Active Problem List   Diagnosis Date Noted   Hypocalcemia 02/07/2021   Non-traumatic rhabdomyolysis 02/07/2021   Acute renal failure (HCC) 02/07/2021   Hypertension 02/07/2021   Weakness generalized 02/07/2021   Paresthesia 02/07/2021   Acute kidney injury (AKI) with  acute tubular necrosis (ATN) (HCC) 02/06/2021   Neck pain 01/13/2019   Neck strain, initial encounter 01/13/2019   Back spasm 01/13/2019   Lymphadenitis  01/13/2019   PCP:  Patient, No Pcp Per (Inactive) Pharmacy:   [id number] [place], [place] - [address] AT [facility] OF [facility] [address] & [facility] [address] [place] [address] Phone: [phone] Fax: [phone]     Social Determinants of Health (SDOH) Interventions    Readmission Risk Interventions No flowsheet data found.

## 2021-02-15 NOTE — Plan of Care (Signed)

## 2021-02-15 NOTE — Progress Notes (Signed)
[facility] Progress Note    36 y.o. male with a past medical history significant for possible htn, admitted for severe renal failure.        Assessment/ Plan:   1) Severe Acute Kidney Injury; cannot rule out progressive CKD: normal Crt about 2 years ago but no data since that time.  Some of the symptoms preceded by possible viral illness.  Now w/ severe renal failure w/ some uremia and decreased UOP. Also with severe weakness and nose bleeds. History of UA w/ protein and hematuria.  GN less likely with positive urine tox for cocaine especially with negative ANCA making levamisole induced vasculitis unlikely.   Presumptive diagnosis of  rhabdo. (ANCA, ANA, anti-GBM, etc) have all been negative   Still  hopeful he will have renal recovery; last HD 12/31 with 2L net UF and then 1/3 0UF.     Good UOP but BUN/Cr mild increase hopefully represents that he is starting to level off.  Plan on kidney biopsy today to rule out definitive GN +  also determine level of chronicity to help predict chances of recovery. I spoke with him  today about this again and he understands.    Unfortunately renal navigator will not be able to find him  a center for the AKI because of logistics; appreciate the renal navigator looking into it. Still awaiting final Medicaid approval and then will try to place if there is no e/o recovery.  Holding HD for now through the weekend.  -appreciate TDC placement by IR -GN labs negative -Myositis work up with neurology -RUS with slightly large kidneys (~13cm) and increase cortical echogenicity -Continue to monitor daily Cr, Dose meds for GFR -Monitor Daily I/Os, Daily weight  -Maintain MAP>65 for optimal renal perfusion.  -Avoid nephrotoxic medications including NSAIDs   2) Hypertension: history of such, unclear control.  Higher at this time.  Started on coreg bid -> responds to  lopressor IV.  Increased carvedilol to 12.5mg  BID on 1/4 -> increased to 25mg   BID   3) Weakness: Work-up per nephrology.  Likely multifactorial.  Profound uremia could definitely be a contributing factor. Possible myositis; viral? Cocaine? Seems to be improving.   4) Hyponatremia: Likely related to free water retention in the setting of severe AKI.  Will cont to monitor   5) Anion gap metabolic acidosis: now improved w/ HD.   6) Hypercalcemia: Low calcium bath with dialysis when necessary; he is not on calcium supplementation or Vit D.  7) Renal osteodystrophy  Start Renvela 3 tabs TIDM  Subjective:   Denies fever, chills, nausea, anorexia or dyspnea. BP slightly better controlled.    Objective:   BP (!) 173/108    Pulse 95    Temp 98.2 F (36.8 C) (Axillary)    Resp 20    Ht 5\' 11"  (1.803 m)    Wt 106.8 kg    SpO2 92%    BMI 32.84 kg/m   Intake/Output Summary (Last 24 hours) at 02/15/2021 0701 Last data filed at 02/15/2021 0636 Gross per 24 hour  Intake 200 ml  Output 2725 ml  Net -2525 ml   Weight change:   Physical Exam: Constitutional:  lying in bed, no distress ENMT: ears and nose without scars or lesions CV: normal rate, trace edema in the bilateral lower extremities Respiratory: bilateral chest rise with increased work of breathing Gastrointestinal: soft, non-tender, no palpable masses or hernias Skin: no visible lesions or rashes Psych: alert, judgement/insight appropriate, appropriate mood and  affect Access: RIJ TC   Imaging: No results found.  Labs: BMET Recent Labs  Lab 02/09/21 0121 02/10/21 0115 02/11/21 0246 02/12/21 0049 02/13/21 0136 02/14/21 0158 02/15/21 0142  NA 132* 133* 133* 134* 136 138 138  K 4.1 4.0 4.3 4.9 4.5 4.1 4.4  CL 97* 99 96* 98 99 100 98  CO2 19* 23 21* 22 26 24 28   GLUCOSE 115* 127* 130* 97 97 132* 123*  BUN 113* 73* 90* 100* 53* 61* 66*  CREATININE 14.40* 11.12* 12.55* 13.25* 7.76* 8.32* 8.77*  CALCIUM 7.7* 8.0* 8.3* 8.6* 9.1 10.3 11.3*  PHOS 7.0* 6.4* 9.1* 10.4* 8.4* 9.8* 10.3*   CBC Recent Labs   Lab 02/12/21 0049 02/13/21 0136 02/14/21 0158 02/15/21 0142  WBC 9.9 8.7 10.2 11.0*  HGB 9.0* 8.5* 8.9* 9.1*  HCT 26.6* 25.2* 27.3* 27.4*  MCV 87.8 88.7 89.8 88.7  PLT 345 346 367 387    Medications:     amLODipine  10 mg Oral Daily   carvedilol  25 mg Oral BID WC   Chlorhexidine Gluconate Cloth  6 each Topical Q0600   heparin injection (subcutaneous)  5,000 Units Subcutaneous Q8H   hydrALAZINE  50 mg Oral Q6H      Otelia Santee, MD 02/15/2021, 7:01 AM

## 2021-02-15 NOTE — Procedures (Signed)
Vascular and Interventional Radiology Procedure Note  Patient: Jolan Upchurch DOB: [birth year] Medical Record Number: [id number] Note Date/Time: 02/15/21 11:55 AM   Performing Physician: Roanna Banning, MD Assistant(s): None  Diagnosis: AKI  Procedure: RENAL BIOPSY, NON-TARGETED  Anesthesia: Conscious Sedation Complications: None Estimated Blood Loss: Minimal Specimens: Sent for Pathology  Findings:  Successful Ultrasound-guided biopsy of RIGHT kidney. A total of 3 samples were obtained. 18G x 3 cores Hemostasis of the tract was achieved using Gelfoam Slurry Embolization.  Plan: Bed rest for 2 hours.  See detailed procedure note with images in PACS. The patient tolerated the procedure well without incident or complication and was returned to Floor Bed in stable condition.    Roanna Banning, MD [facility]   Pager. [phone] Clinic. [phone]

## 2021-02-15 NOTE — Progress Notes (Signed)
Spoke to [place] with financial unit yesterday afternoon. Pt's medicaid has not been approved at this time. Contacted pt this afternoon via phone and spoke to pt's wife who answered the phone due to pt being unavailable. Wife advised that per [place], pt's medicaid is still pending. Wife reports speaking to [place] today and aware of this information. Will continue to follow and assist as needed.  Olivia Canter Renal Navigator [phone]

## 2021-02-16 LAB — CBC
HCT: 29.2 % — ABNORMAL LOW (ref 39.0–52.0)
Hemoglobin: 9.7 g/dL — ABNORMAL LOW (ref 13.0–17.0)
MCH: 29.2 pg (ref 26.0–34.0)
MCHC: 33.2 g/dL (ref 30.0–36.0)
MCV: 88 fL (ref 80.0–100.0)
Platelets: 436 10*3/uL — ABNORMAL HIGH (ref 150–400)
RBC: 3.32 MIL/uL — ABNORMAL LOW (ref 4.22–5.81)
RDW: 12.6 % (ref 11.5–15.5)
WBC: 11.4 10*3/uL — ABNORMAL HIGH (ref 4.0–10.5)
nRBC: 0 % (ref 0.0–0.2)

## 2021-02-16 LAB — RENAL FUNCTION PANEL
Albumin: 3.2 g/dL — ABNORMAL LOW (ref 3.5–5.0)
Anion gap: 12 (ref 5–15)
BUN: 64 mg/dL — ABNORMAL HIGH (ref 6–20)
CO2: 26 mmol/L (ref 22–32)
Calcium: 11.9 mg/dL — ABNORMAL HIGH (ref 8.9–10.3)
Chloride: 96 mmol/L — ABNORMAL LOW (ref 98–111)
Creatinine, Ser: 8.12 mg/dL — ABNORMAL HIGH (ref 0.61–1.24)
GFR, Estimated: 8 mL/min — ABNORMAL LOW (ref >60)
Glucose, Bld: 101 mg/dL — ABNORMAL HIGH (ref 70–99)
Phosphorus: 9.8 mg/dL — ABNORMAL HIGH (ref 2.5–4.6)
Potassium: 4.3 mmol/L (ref 3.5–5.1)
Sodium: 134 mmol/L — ABNORMAL LOW (ref 135–145)

## 2021-02-16 MED ORDER — HYDRALAZINE HCL 20 MG/ML IJ SOLN
10.0000 mg | Freq: Four times a day (QID) | INTRAMUSCULAR | Status: DC | PRN
  Administered 2021-02-16: 10 mg via INTRAVENOUS
  Filled 2021-02-16: qty 1

## 2021-02-16 MED ORDER — HYDRALAZINE HCL 50 MG PO TABS
100.0000 mg | ORAL_TABLET | Freq: Three times a day (TID) | ORAL | Status: DC
  Administered 2021-02-16 – 2021-02-17 (×3): 100 mg via ORAL
  Filled 2021-02-16 (×3): qty 2

## 2021-02-16 MED ORDER — LABETALOL HCL 5 MG/ML IV SOLN
10.0000 mg | Freq: Four times a day (QID) | INTRAVENOUS | Status: DC | PRN
  Filled 2021-02-16: qty 4

## 2021-02-16 NOTE — Progress Notes (Signed)
[facility] Progress Note    36 y.o. male with a past medical history significant for possible htn, admitted for severe renal failure.        Assessment/ Plan:   1) Severe Acute Kidney Injury; cannot rule out progressive CKD: normal Crt about 2 years ago but no data since that time.  Some of the symptoms preceded by possible viral illness.  Now w/ severe renal failure w/ some uremia and decreased UOP. Also with severe weakness and nose bleeds. History of UA w/ protein and hematuria.  GN less likely with positive urine tox for cocaine especially with negative ANCA making levamisole induced vasculitis unlikely.   Presumptive diagnosis of  rhabdo. (ANCA, ANA, anti-GBM, etc) have all been negative   Still  hopeful he will have renal recovery; last HD 12/31 with 2L net UF and then 1/3 0UF.     Good UOP but BUN/Cr mild increase hopefully represents that he is starting to level off.  Appreciate IR kidney biopsy 1/6 to rule out definitive GN +  also determine level of chronicity to help predict chances of recovery.   I am still hopeful he will regain function; for the 1st time cr may be leveling off.    Unfortunately renal navigator will not be able to find him  a center for the AKI because of logistics; appreciate the renal navigator looking into it. Still awaiting final Medicaid approval and then will try to place if there is no e/o recovery. May finally be starting to enter the recovery phase of ATN.  Holding HD for now through the weekend; can pull the catheter once we see an improving trend to his renal function.  -appreciate TDC placement by IR + u/s guided renal biopsy -GN labs negative -Myositis work up with neurology -RUS with slightly large kidneys (~13cm) and increase cortical echogenicity -Continue to monitor daily Cr, Dose meds for GFR -Monitor Daily I/Os, Daily weight  -Maintain MAP>65 for optimal renal perfusion.  -Avoid nephrotoxic medications including  NSAIDs   2) Hypertension: history of such, unclear control.  Higher at this time.  On max dose coreg bid and hydralazine dose doubled.   3) Weakness: Work-up per nephrology.  Likely multifactorial.  Profound uremia could definitely be a contributing factor. Possible myositis; viral? Cocaine? Seems to be improving.   4) Hyponatremia: Likely related to free water retention in the setting of severe AKI.  Will cont to monitor   5) Anion gap metabolic acidosis: improved w/ HD and will monitor now off HD.   6) Hypercalcemia: Low calcium bath with dialysis when necessary; he is not on calcium supplementation or Vit D.  7) Renal osteodystrophy  Started Renvela 3 tabs TIDM and will recheck phos on Monday; will be able to discontinue as his renal function returns.  Subjective:   Denies fever, chills, nausea, anorexia or dyspnea. BP slightly better controlled. Pain at the biopsy site.    Objective:   BP (!) 172/101    Pulse (!) 107    Temp 98.9 F (37.2 C) (Axillary)    Resp 19    Ht 5\' 11"  (1.803 m)    Wt 106.8 kg    SpO2 95%    BMI 32.84 kg/m   Intake/Output Summary (Last 24 hours) at 02/16/2021 0711 Last data filed at 02/16/2021 0400 Gross per 24 hour  Intake --  Output 3400 ml  Net -3400 ml   Weight change:   Physical Exam: Constitutional:  lying in bed, no  distress ENMT: ears and nose without scars or lesions CV: normal rate, trace edema in the bilateral lower extremities Respiratory: bilateral chest rise with increased work of breathing Gastrointestinal: soft, non-tender, no palpable masses or hernias Skin: no visible lesions or rashes Psych: alert, judgement/insight appropriate, appropriate mood and affect Access: RIJ TC   Imaging: US BIOPSY (KIDNEY)  Result Date: 02/15/2021 INDICATION: Severe AKI. EXAM: ULTRASOUND GUIDED RENAL BIOPSY COMPARISON:  US Renal, 02/07/2019. MEDICATIONS: None. ANESTHESIA/SEDATION: Fentanyl 100 mcg IV; Versed 2.5 mg IV Total Moderate Sedation time: 24  minutes; The patient was continuously monitored during the procedure by the interventional radiology nurse under my direct supervision. COMPLICATIONS: None immediate. PROCEDURE: Informed written consent was obtained from the patient and/or patient's representative after a discussion of the risks, benefits and alternatives to treatment. The patient understands and consents the procedure. A timeout was performed prior to the initiation of the procedure. Ultrasound scanning was performed of the bilateral flanks. The inferior pole of the RIGHT kidney was selected for biopsy due to location and sonographic window. The procedure was planned. The operative site was prepped and draped in the usual sterile fashion. The overlying soft tissues were anesthetized with 1% lidocaine with epinephrine. A 17 gauge core needle biopsy device was advanced into the inferior cortex of the RIGHT kidney and 3 core biopsies were obtained under direct ultrasound guidance. Images were saved for documentation purposes. The biopsy device was removed and hemostasis was obtained with the injection of Gelfoam slurry. Post procedural scanning was negative for significant post procedural hemorrhage or additional complication. A dressing was placed. The patient tolerated the procedure well without immediate post procedural complication. IMPRESSION: Successful ultrasound-guided RIGHT non-targeted renal biopsy, as above. Michaelle Birks, MD [facility] Radiology Electronically Signed   By: Michaelle Birks M.D.   On: 02/15/2021 16:26    Labs: BMET Recent Labs  Lab 02/10/21 0115 02/11/21 0246 02/12/21 0049 02/13/21 0136 02/14/21 0158 02/15/21 0142 02/16/21 0135  NA 133* 133* 134* 136 138 138 134*  K 4.0 4.3 4.9 4.5 4.1 4.4 4.3  CL 99 96* 98 99 100 98 96*  CO2 23 21* 22 26 24 28 26   GLUCOSE 127* 130* 97 97 132* 123* 101*  BUN 73* 90* 100* 53* 61* 66* 64*  CREATININE 11.12* 12.55* 13.25* 7.76*  8.32* 8.77* 8.12*  CALCIUM 8.0* 8.3* 8.6* 9.1 10.3 11.3* 11.9*  PHOS 6.4* 9.1* 10.4* 8.4* 9.8* 10.3* 9.8*   CBC Recent Labs  Lab 02/13/21 0136 02/14/21 0158 02/15/21 0142 02/16/21 0135  WBC 8.7 10.2 11.0* 11.4*  HGB 8.5* 8.9* 9.1* 9.7*  HCT 25.2* 27.3* 27.4* 29.2*  MCV 88.7 89.8 88.7 88.0  PLT 346 367 387 436*    Medications:     amLODipine  10 mg Oral Daily   carvedilol  25 mg Oral BID WC   Chlorhexidine Gluconate Cloth  6 each Topical Q0600   heparin injection (subcutaneous)  5,000 Units Subcutaneous Q8H   hydrALAZINE  50 mg Oral Q6H   sevelamer carbonate  2,400 mg Oral TID WC      Otelia Santee, MD 02/16/2021, 7:11 AM

## 2021-02-16 NOTE — Plan of Care (Signed)

## 2021-02-16 NOTE — Progress Notes (Signed)
PROGRESS NOTE        PATIENT DETAILS Name: Erik Griffith Age: 36 y.o. Sex: male Date of Birth: 03/14/1985 Admit Date: 02/06/2021 Admitting Physician Vernelle Emerald, MD BP:[id number], No Pcp Per (Inactive)  Brief Narrative: Patient is a 36 y.o. male with history of HTN-who presented with fatigue/weakness-found to have new onset AKI.  See below for further details.  Subjective: Lying comfortably in bed-no major issues.  No chest pain or shortness of breath.  Objective: Vitals: Blood pressure (!) 160/113, pulse (!) 109, temperature 98.2 F (36.8 C), temperature source Axillary, resp. rate 19, height 5\' 11"  (1.803 m), weight 106.8 kg, SpO2 95 %.   Exam: Gen Exam:Alert awake-not in any distress HEENT:atraumatic, normocephalic Chest: B/L clear to auscultation anteriorly CVS:S1S2 regular Abdomen:soft non tender, non distended Extremities:no edema Neurology: Non focal Skin: no rash   Pertinent Labs/Radiology: Recent Labs  Lab 02/16/21 0135  WBC 11.4*  HGB 9.7*  PLT 436*  NA 134*  K 4.3  CREATININE 8.12*     Assessment/Plan: AKI: Thought to be tubular injury in the setting of rhabdomyolysis from cocaine use.  Autoimmune serology/SPEP negative so far.  Significant improvement in urine output over the past few days-renal function is essentially the same.  Hopefully we are now seeing signs of some renal recovery.  Continue to watch closely-s/p CT-guided biopsy on 1/6.  Nephrology following.    Normocytic anemia: Due to AKI/acute illness: No indication to transfuse PRBC at this point-follow closely.  Rhabdomyolysis: CK has significantly improved-likely due to cocaine use.  Myositis: Due to cocaine use-likely causing rhabdomyolysis.  Myositis panel still pending.  HTN: BP remains on the higher side-adjusted dose of hydralazine today-continue Coreg/Norvasc.  Reassess tomorrow.    Cocaine use: Counseling prior to discharge  Obesity: Estimated body  mass index is 32.84 kg/m as calculated from the following:   Height as of this encounter: 5\' 11"  (1.803 m).   Weight as of this encounter: 106.8 kg.   Procedures: None Consults: Renal, neurology DVT Prophylaxis: SQ Heparin-but has refused over the past several days-claims he is ambulating. Code Status:Full code  Family Communication: Spouse at bedside  Time spent: 38- minutes-Greater than 50% of this time was spent in counseling, explanation of diagnosis, planning of further management, and coordination of care.   Disposition Plan: Status is: Inpatient  Remains inpatient appropriate because: AKI-requiring intermittent HD-awaiting renal recovery.    Diet: Diet Order             Diet 2 gram sodium Room service appropriate? Yes; Fluid consistency: Thin; Fluid restriction: 1800 mL Fluid  Diet effective now                     Antimicrobial agents: Anti-infectives (From admission, onward)    Start     Dose/Rate Route Frequency Ordered Stop   02/07/21 1400  ceFAZolin (ANCEF) IVPB 2g/100 mL premix  Status:  Discontinued        2 g 200 mL/hr over 30 Minutes Intravenous Every 8 hours 02/07/21 1219 02/07/21 1220   02/07/21 1315  ceFAZolin (ANCEF) IVPB 2g/100 mL premix        2 g 200 mL/hr over 30 Minutes Intravenous  Once 02/07/21 1220 02/07/21 1250        MEDICATIONS: Scheduled Meds:  amLODipine  10 mg Oral Daily   carvedilol  25  mg Oral BID WC   Chlorhexidine Gluconate Cloth  6 each Topical Q0600   heparin injection (subcutaneous)  5,000 Units Subcutaneous Q8H   hydrALAZINE  100 mg Oral Q8H   sevelamer carbonate  2,400 mg Oral TID WC   Continuous Infusions: PRN Meds:.acetaminophen **OR** acetaminophen, albuterol, hydrALAZINE, labetalol, ondansetron **OR** ondansetron (ZOFRAN) IV, oxyCODONE, senna-docusate, traZODone   I have personally reviewed following labs and imaging studies  LABORATORY DATA: CBC: Recent Labs  Lab 02/12/21 0049 02/13/21 0136  02/14/21 0158 02/15/21 0142 02/16/21 0135  WBC 9.9 8.7 10.2 11.0* 11.4*  HGB 9.0* 8.5* 8.9* 9.1* 9.7*  HCT 26.6* 25.2* 27.3* 27.4* 29.2*  MCV 87.8 88.7 89.8 88.7 88.0  PLT 345 346 367 387 436*     Basic Metabolic Panel: Recent Labs  Lab 02/12/21 0049 02/13/21 0136 02/14/21 0158 02/15/21 0142 02/16/21 0135  NA 134* 136 138 138 134*  K 4.9 4.5 4.1 4.4 4.3  CL 98 99 100 98 96*  CO2 22 26 24 28 26   GLUCOSE 97 97 132* 123* 101*  BUN 100* 53* 61* 66* 64*  CREATININE 13.25* 7.76* 8.32* 8.77* 8.12*  CALCIUM 8.6* 9.1 10.3 11.3* 11.9*  PHOS 10.4* 8.4* 9.8* 10.3* 9.8*     GFR: Estimated Creatinine Clearance: 15.8 mL/min (A) (by C-G formula based on SCr of 8.12 mg/dL (H)).  Liver Function Tests: Recent Labs  Lab 02/12/21 0049 02/13/21 0136 02/14/21 0158 02/15/21 0142 02/16/21 0135  ALBUMIN 2.7* 2.7* 2.8* 3.0* 3.2*    No results for input(s): LIPASE, AMYLASE in the last 168 hours. No results for input(s): AMMONIA in the last 168 hours.  Coagulation Profile: Recent Labs  Lab 02/12/21 0049 02/15/21 0142  INR 0.9 1.1     Cardiac Enzymes: Recent Labs  Lab 02/10/21 0115 02/11/21 0246  CKTOTAL 1,071* 650*     BNP (last 3 results) No results for input(s): PROBNP in the last 8760 hours.  Lipid Profile: No results for input(s): CHOL, HDL, LDLCALC, TRIG, CHOLHDL, LDLDIRECT in the last 72 hours.  Thyroid Function Tests: No results for input(s): TSH, T4TOTAL, FREET4, T3FREE, THYROIDAB in the last 72 hours.  Anemia Panel: No results for input(s): VITAMINB12, FOLATE, FERRITIN, TIBC, IRON, RETICCTPCT in the last 72 hours.  Urine analysis:    Component Value Date/Time   COLORURINE STRAW (A) 02/08/2021 0740   APPEARANCEUR CLEAR 02/08/2021 0740   LABSPEC 1.009 02/08/2021 0740   PHURINE 5.0 02/08/2021 0740   GLUCOSEU NEGATIVE 02/08/2021 0740   HGBUR MODERATE (A) 02/08/2021 0740   BILIRUBINUR NEGATIVE 02/08/2021 0740   KETONESUR NEGATIVE 02/08/2021 0740    PROTEINUR 30 (A) 02/08/2021 0740   NITRITE NEGATIVE 02/08/2021 0740   LEUKOCYTESUR NEGATIVE 02/08/2021 0740    Sepsis Labs: Lactic Acid, Venous No results found for: LATICACIDVEN  MICROBIOLOGY: Recent Results (from the past 240 hour(s))  Resp Panel by RT-PCR (Flu A&B, Covid) Nasopharyngeal Swab     Status: None   Collection Time: 02/06/21  7:33 PM   Specimen: Nasopharyngeal Swab; Nasopharyngeal(NP) swabs in vial transport medium  Result Value Ref Range Status   SARS Coronavirus 2 by RT PCR NEGATIVE NEGATIVE Final    Comment: (NOTE) SARS-CoV-2 target nucleic acids are NOT DETECTED.  The SARS-CoV-2 RNA is generally detectable in upper respiratory specimens during the acute phase of infection. The lowest concentration of SARS-CoV-2 viral copies this assay can detect is 138 copies/mL. A negative result does not preclude SARS-Cov-2 infection and should not be used as the sole basis for treatment  or other patient management decisions. A negative result may occur with  improper specimen collection/handling, submission of specimen other than nasopharyngeal swab, presence of viral mutation(s) within the areas targeted by this assay, and inadequate number of viral copies(<138 copies/mL). A negative result must be combined with clinical observations, patient history, and epidemiological information. The expected result is Negative.  Fact Sheet for Patients:  EntrepreneurPulse.com.au  Fact Sheet for Healthcare Providers:  IncredibleEmployment.be  This test is no t yet approved or cleared by the [place] FDA and  has been authorized for detection and/or diagnosis of SARS-CoV-2 by FDA under an Emergency Use Authorization (EUA). This EUA will remain  in effect (meaning this test can be used) for the duration of the COVID-19 declaration under Section 564(b)(1) of the Act, 21 U.S.C.section 360bbb-3(b)(1), unless the authorization is terminated  or  revoked sooner.       Influenza A by PCR NEGATIVE NEGATIVE Final   Influenza B by PCR NEGATIVE NEGATIVE Final    Comment: (NOTE) The Xpert Xpress SARS-CoV-2/FLU/RSV plus assay is intended as an aid in the diagnosis of influenza from Nasopharyngeal swab specimens and should not be used as a sole basis for treatment. Nasal washings and aspirates are unacceptable for Xpert Xpress SARS-CoV-2/FLU/RSV testing.  Fact Sheet for Patients: EntrepreneurPulse.com.au  Fact Sheet for Healthcare Providers: IncredibleEmployment.be  This test is not yet approved or cleared by the [place] FDA and has been authorized for detection and/or diagnosis of SARS-CoV-2 by FDA under an Emergency Use Authorization (EUA). This EUA will remain in effect (meaning this test can be used) for the duration of the COVID-19 declaration under Section 564(b)(1) of the Act, 21 U.S.C. section 360bbb-3(b)(1), unless the authorization is terminated or revoked.  Performed at [facility], [address] [place], [place] [address]     RADIOLOGY STUDIES/RESULTS: US BIOPSY (KIDNEY)  Result Date: 02/15/2021 INDICATION: Severe AKI. EXAM: ULTRASOUND GUIDED RENAL BIOPSY COMPARISON:  US Renal, 02/07/2019. MEDICATIONS: None. ANESTHESIA/SEDATION: Fentanyl 100 mcg IV; Versed 2.5 mg IV Total Moderate Sedation time: 24 minutes; The patient was continuously monitored during the procedure by the interventional radiology nurse under my direct supervision. COMPLICATIONS: None immediate. PROCEDURE: Informed written consent was obtained from the patient and/or patient's representative after a discussion of the risks, benefits and alternatives to treatment. The patient understands and consents the procedure. A timeout was performed prior to the initiation of the procedure. Ultrasound scanning was performed of the bilateral flanks. The inferior pole of the RIGHT kidney was selected for  biopsy due to location and sonographic window. The procedure was planned. The operative site was prepped and draped in the usual sterile fashion. The overlying soft tissues were anesthetized with 1% lidocaine with epinephrine. A 17 gauge core needle biopsy device was advanced into the inferior cortex of the RIGHT kidney and 3 core biopsies were obtained under direct ultrasound guidance. Images were saved for documentation purposes. The biopsy device was removed and hemostasis was obtained with the injection of Gelfoam slurry. Post procedural scanning was negative for significant post procedural hemorrhage or additional complication. A dressing was placed. The patient tolerated the procedure well without immediate post procedural complication. IMPRESSION: Successful ultrasound-guided RIGHT non-targeted renal biopsy, as above. Michaelle Birks, MD [facility] Radiology Electronically Signed   By: Michaelle Birks M.D.   On: 02/15/2021 16:26     LOS: 9 days   Oren Binet, MD  [facility]    To contact the attending provider between  7A-7P or the covering provider during after hours 7P-7A, please log into the web site [email or web address] and access using universal [place] password for that web site. If you do not have the password, please call the hospital operator.  02/16/2021, 11:14 AM

## 2021-02-17 LAB — CBC
HCT: 27.6 % — ABNORMAL LOW (ref 39.0–52.0)
Hemoglobin: 9.1 g/dL — ABNORMAL LOW (ref 13.0–17.0)
MCH: 29.4 pg (ref 26.0–34.0)
MCHC: 33 g/dL (ref 30.0–36.0)
MCV: 89 fL (ref 80.0–100.0)
Platelets: 451 10*3/uL — ABNORMAL HIGH (ref 150–400)
RBC: 3.1 MIL/uL — ABNORMAL LOW (ref 4.22–5.81)
RDW: 12.7 % (ref 11.5–15.5)
WBC: 11.9 10*3/uL — ABNORMAL HIGH (ref 4.0–10.5)
nRBC: 0 % (ref 0.0–0.2)

## 2021-02-17 LAB — RENAL FUNCTION PANEL
Albumin: 3.2 g/dL — ABNORMAL LOW (ref 3.5–5.0)
Anion gap: 14 (ref 5–15)
BUN: 64 mg/dL — ABNORMAL HIGH (ref 6–20)
CO2: 27 mmol/L (ref 22–32)
Calcium: 12 mg/dL — ABNORMAL HIGH (ref 8.9–10.3)
Chloride: 94 mmol/L — ABNORMAL LOW (ref 98–111)
Creatinine, Ser: 7.57 mg/dL — ABNORMAL HIGH (ref 0.61–1.24)
GFR, Estimated: 9 mL/min — ABNORMAL LOW (ref >60)
Glucose, Bld: 102 mg/dL — ABNORMAL HIGH (ref 70–99)
Phosphorus: 8.9 mg/dL — ABNORMAL HIGH (ref 2.5–4.6)
Potassium: 4.1 mmol/L (ref 3.5–5.1)
Sodium: 135 mmol/L (ref 135–145)

## 2021-02-17 MED ORDER — AMLODIPINE BESYLATE 10 MG PO TABS: 10.0000 mg | ORAL_TABLET | Freq: Every day | ORAL | 2 refills | Status: AC

## 2021-02-17 MED ORDER — CARVEDILOL 25 MG PO TABS: 25.0000 mg | ORAL_TABLET | Freq: Two times a day (BID) | ORAL | 2 refills | Status: AC

## 2021-02-17 MED ORDER — SEVELAMER CARBONATE 800 MG PO TABS: 2400.0000 mg | ORAL_TABLET | Freq: Three times a day (TID) | ORAL | 0 refills | Status: AC

## 2021-02-17 MED ORDER — HYDRALAZINE HCL 100 MG PO TABS: 50.0000 mg | ORAL_TABLET | Freq: Two times a day (BID) | ORAL | 2 refills | Status: AC

## 2021-02-17 NOTE — Progress Notes (Signed)
[facility] Progress Note    36 y.o. male with a past medical history significant for possible htn, admitted for severe renal failure.        Assessment/ Plan:   1) Severe Acute Kidney Injury; cannot rule out progressive CKD: normal Crt about 2 years ago but no data since that time.  Some of the symptoms preceded by possible viral illness.  Now w/ severe renal failure w/ some uremia and decreased UOP. Also with severe weakness and nose bleeds. History of UA w/ protein and hematuria.  GN less likely with positive urine tox for cocaine especially with negative ANCA making levamisole induced vasculitis unlikely.   Presumptive diagnosis of  rhabdo. (ANCA, ANA, anti-GBM, etc) have all been negative   Still  hopeful he will have renal recovery; last HD 12/31 with 2L net UF and then 1/3 0UF.     Good UOP but BUN/Cr mild increase hopefully represents that he is starting to level off.  Appreciate IR kidney biopsy 1/6 to rule out definitive GN +  also determine level of chronicity to help predict chances of recovery.   He appears to have reached the plateau and cr at least stable or moving in the right direction.  I pulled the catheter today and will have him f/u with [facility] in 1-2 weeks + lab check + go over the biopsy results to determine how much chronic damage is there. I will call for an appt tomorrow.  -appreciate TDC placement by IR + u/s guided renal biopsy -GN labs negative -Myositis work up with neurology -RUS with slightly large kidneys (~13cm) and increase cortical echogenicity -Continue to monitor daily Cr, Dose meds for GFR -Monitor Daily I/Os, Daily weight  -Maintain MAP>65 for optimal renal perfusion.  -Avoid nephrotoxic medications including NSAIDs   2) Hypertension: history of such, unclear control.  Will get better w/ renal improvement and should be able titrate down on his antihypertensives over the next 1-2 weeks. - can d/c on hydralazine 50mg  BID, cont  amlodipine and carvedilol. Likely can titrate those down as well as outpt.  3) Weakness: Work-up per nephrology.  Likely multifactorial.  Profound uremia could definitely be a contributing factor. Possible myositis; viral? Cocaine? Seems to be improving.   4) Hyponatremia: Likely related to free water retention in the setting of severe AKI.  Will cont to monitor   5) Anion gap metabolic acidosis: improved w/ HD and will monitor now off HD.   6) Hypercalcemia: Low calcium bath with dialysis when necessary; he is not on calcium supplementation or Vit D.  7) Renal osteodystrophy  Started Renvela 3 tabs TIDM and will recheck phos on Monday; will be able to discontinue as his renal function returns.  Subjective:   Denies fever, chills, nausea, anorexia or dyspnea. BP slightly better controlled. Pain at the biopsy site better.    Objective:   BP (!) 165/85    Pulse (!) 105    Temp 97.8 F (36.6 C) (Oral)    Resp 20    Ht 5\' 11"  (1.803 m)    Wt 106.8 kg    SpO2 95%    BMI 32.84 kg/m   Intake/Output Summary (Last 24 hours) at 02/17/2021 0656 Last data filed at 02/17/2021 0631 Gross per 24 hour  Intake 360 ml  Output 3050 ml  Net -2690 ml   Weight change:   Physical Exam: Constitutional:  lying in bed, no distress ENMT: ears and nose without scars or lesions CV: normal  rate, trace edema in the bilateral lower extremities Respiratory: bilateral chest rise with increased work of breathing Gastrointestinal: soft, non-tender, no palpable masses or hernias Skin: no visible lesions or rashes Psych: alert, judgement/insight appropriate, appropriate mood and affect Access: RIJ TC   Imaging: US BIOPSY (KIDNEY)  Result Date: 02/15/2021 INDICATION: Severe AKI. EXAM: ULTRASOUND GUIDED RENAL BIOPSY COMPARISON:  US Renal, 02/07/2019. MEDICATIONS: None. ANESTHESIA/SEDATION: Fentanyl 100 mcg IV; Versed 2.5 mg IV Total Moderate Sedation time: 24 minutes; The patient was continuously monitored during  the procedure by the interventional radiology nurse under my direct supervision. COMPLICATIONS: None immediate. PROCEDURE: Informed written consent was obtained from the patient and/or patient's representative after a discussion of the risks, benefits and alternatives to treatment. The patient understands and consents the procedure. A timeout was performed prior to the initiation of the procedure. Ultrasound scanning was performed of the bilateral flanks. The inferior pole of the RIGHT kidney was selected for biopsy due to location and sonographic window. The procedure was planned. The operative site was prepped and draped in the usual sterile fashion. The overlying soft tissues were anesthetized with 1% lidocaine with epinephrine. A 17 gauge core needle biopsy device was advanced into the inferior cortex of the RIGHT kidney and 3 core biopsies were obtained under direct ultrasound guidance. Images were saved for documentation purposes. The biopsy device was removed and hemostasis was obtained with the injection of Gelfoam slurry. Post procedural scanning was negative for significant post procedural hemorrhage or additional complication. A dressing was placed. The patient tolerated the procedure well without immediate post procedural complication. IMPRESSION: Successful ultrasound-guided RIGHT non-targeted renal biopsy, as above. Michaelle Birks, MD [facility] Radiology Electronically Signed   By: Michaelle Birks M.D.   On: 02/15/2021 16:26    Labs: BMET Recent Labs  Lab 02/11/21 0246 02/12/21 0049 02/13/21 0136 02/14/21 0158 02/15/21 0142 02/16/21 0135 02/17/21 0037  NA 133* 134* 136 138 138 134* 135  K 4.3 4.9 4.5 4.1 4.4 4.3 4.1  CL 96* 98 99 100 98 96* 94*  CO2 21* 22 26 24 28 26 27   GLUCOSE 130* 97 97 132* 123* 101* 102*  BUN 90* 100* 53* 61* 66* 64* 64*  CREATININE 12.55* 13.25* 7.76* 8.32* 8.77* 8.12* 7.57*  CALCIUM 8.3* 8.6* 9.1 10.3 11.3* 11.9*  12.0*  PHOS 9.1* 10.4* 8.4* 9.8* 10.3* 9.8* 8.9*   CBC Recent Labs  Lab 02/14/21 0158 02/15/21 0142 02/16/21 0135 02/17/21 0037  WBC 10.2 11.0* 11.4* 11.9*  HGB 8.9* 9.1* 9.7* 9.1*  HCT 27.3* 27.4* 29.2* 27.6*  MCV 89.8 88.7 88.0 89.0  PLT 367 387 436* 451*    Medications:     amLODipine  10 mg Oral Daily   carvedilol  25 mg Oral BID WC   Chlorhexidine Gluconate Cloth  6 each Topical Q0600   heparin injection (subcutaneous)  5,000 Units Subcutaneous Q8H   hydrALAZINE  100 mg Oral Q8H   sevelamer carbonate  2,400 mg Oral TID WC      Otelia Santee, MD 02/17/2021, 6:56 AM

## 2021-02-17 NOTE — Discharge Summary (Signed)
PATIENT DETAILS Name: Erik Griffith Age: 36 y.o. Sex: male Date of Birth: [birth year] MRN: [id number]. Admitting Physician: Marinda Elk, MD UII:[id number], No Pcp Per (Inactive)  Admit Date: 02/06/2021 Discharge date: 02/17/2021  Recommendations for Outpatient Follow-up:  Follow up with PCP in 1-2 weeks Please obtain CMP/CBC in one week Please follow renal biopsy results Please ensure follow-up with nephrology.  Admitted From:  Home  Disposition: Home   Home Health: No  Equipment/Devices: None  Discharge Condition: Stable  CODE STATUS: FULL CODE  Diet recommendation:  Diet Order             Diet - low sodium heart healthy           Diet 2 gram sodium Room service appropriate? Yes; Fluid consistency: Thin; Fluid restriction: 1800 mL Fluid  Diet effective now                    Brief Summary: Patient is a 36 y.o. male with history of HTN-who presented with fatigue/weakness-found to have new onset AKI.  See below for further details.  Brief Hospital Course: AKI: Thought to be tubular injury in the setting of rhabdomyolysis from cocaine use.  Autoimmune serology/SPEP negative so far.  Renal function is gradually improving-urine output has been robust over the past several days.  Did require intermittent HD during this hospitalization.  Discussed with nephrologist-Dr. Juel Burrow today-HD catheter to be removed-he will follow patient in the office and follow-up on renal biopsy results.    Normocytic anemia: Due to AKI/acute illness: No indication to transfuse PRBC at this point-follow closely.   Rhabdomyolysis: CK has significantly improved-likely due to cocaine use.   Myositis: Due to cocaine use-likely causing rhabdomyolysis.  Myositis panel was negative.   HTN: BP better controlled-was in the 120s systolic this morning-we will continue Coreg/Norvasc but decrease hydralazine to twice daily dosing on discharge.  Neurology will adjust further in the outpatient  setting.   Cocaine use: Counseling prior to discharge.  He claims he was in a situation that may have caused him to have some inadvertent use of cocaine.  I have advised him of the life-threatening and life disabling effects of cocaine use.   Obesity: Estimated body mass index is 32.84 kg/m as calculated from the following:   Height as of this encounter: 5\' 11"  (1.803 m).   Weight as of this encounter: 106.8 kg.   Procedures 1/6>> CT-guided renal biopsy  Discharge Diagnoses:  Principal Problem:   Acute kidney injury (AKI) with acute tubular necrosis (ATN) (HCC) Active Problems:   Hypocalcemia   Non-traumatic rhabdomyolysis   Hypertension   Weakness generalized   Paresthesia   Discharge Instructions:  Activity:  As tolerated    Discharge Instructions     Diet - low sodium heart healthy   Complete by: As directed    Discharge instructions   Complete by: As directed    Follow with Primary MD in 1-2 weeks  Please get a complete blood count and chemistry panel checked by your Primary MD at your next visit, and again as instructed by your Primary MD.  Get Medicines reviewed and adjusted: Please take all your medications with you for your next visit with your Primary MD  Laboratory/radiological data: Please request your Primary MD to go over all hospital tests and procedure/radiological results at the follow up, please ask your Primary MD to get all Hospital records sent to his/her office.  In some cases, they will be blood work, cultures  and biopsy results pending at the time of your discharge. Please request that your primary care M.D. follows up on these results.  Also Note the following: If you experience worsening of your admission symptoms, develop shortness of breath, life threatening emergency, suicidal or homicidal thoughts you must seek medical attention immediately by calling 911 or calling your MD immediately  if symptoms less severe.  You must read complete  instructions/literature along with all the possible adverse reactions/side effects for all the Medicines you take and that have been prescribed to you. Take any new Medicines after you have completely understood and accpet all the possible adverse reactions/side effects.   Do not drive when taking Pain medications or sleeping medications (Benzodaizepines)  Do not take more than prescribed Pain, Sleep and Anxiety Medications. It is not advisable to combine anxiety,sleep and pain medications without talking with your primary care practitioner  Special Instructions: If you have smoked or chewed Tobacco  in the last 2 yrs please stop smoking, stop any regular Alcohol  and or any Recreational drug use.  Wear Seat belts while driving.  Please note: You were cared for by a hospitalist during your hospital stay. Once you are discharged, your primary care physician will handle any further medical issues. Please note that NO REFILLS for any discharge medications will be authorized once you are discharged, as it is imperative that you return to your primary care physician (or establish a relationship with a primary care physician if you do not have one) for your post hospital discharge needs so that they can reassess your need for medications and monitor your lab values.   Do not use over-the-counter medication called NSAIDs-medications like Motrin, ibuprofen, Aleve and Advil.  They will damage her kidneys further.  If you have headache or fever use Tylenol.  Please talk with the nephrologist/primary care practitioner before starting any over-the-counter medications or prescription medications.   Increase activity slowly   Complete by: As directed    No dressing needed   Complete by: As directed       Allergies as of 02/17/2021   No Known Allergies      Medication List     STOP taking these medications    ibuprofen 600 MG tablet Commonly known as: ADVIL   oxyCODONE 5 MG immediate release  tablet Commonly known as: Oxy IR/ROXICODONE       TAKE these medications    acetaminophen 325 MG tablet Commonly known as: TYLENOL Take 2 tablets (650 mg total) by mouth every 6 (six) hours. What changed:  when to take this reasons to take this   amLODipine 10 MG tablet Commonly known as: NORVASC Take 1 tablet (10 mg total) by mouth daily. Start taking on: February 18, 2021   carvedilol 25 MG tablet Commonly known as: COREG Take 1 tablet (25 mg total) by mouth 2 (two) times daily with a meal.   hydrALAZINE 100 MG tablet Commonly known as: APRESOLINE Take 0.5 tablets (50 mg total) by mouth every 12 (twelve) hours.   sevelamer carbonate 800 MG tablet Commonly known as: RENVELA Take 3 tablets (2,400 mg total) by mouth 3 (three) times daily with meals.               Discharge Care Instructions  (From admission, onward)           Start     Ordered   02/17/21 0000  No dressing needed        02/17/21 6838  Follow-up Information     Dwana Melena, MD Follow up.   Specialty: Nephrology Why: Office will call with date/time, If you dont hear from them,please give them a call Contact information: [place] [address] [phone]         Primary Care MD. Schedule an appointment as soon as possible for a visit in 1 week(s).                 No Known Allergies    Consultations:  nephrology and neurology    Other Procedures/Studies: DG Chest 2 View  Result Date: 02/06/2021 CLINICAL DATA:  Shortness of breath. Ten day history of nausea, vomiting, diarrhea, chest pain, and shortness of breath. EXAM: CHEST - 2 VIEW COMPARISON:  None. FINDINGS: The heart size and mediastinal contours are within normal limits. Both lungs are clear. The visualized skeletal structures are unremarkable. IMPRESSION: No active cardiopulmonary disease. Electronically Signed   By: Lucienne Capers M.D.   On: 02/06/2021 20:02   MR BRAIN W WO  CONTRAST  Result Date: 02/07/2021 CLINICAL DATA:  Motor neuron disease suspected EXAM: MRI HEAD WITHOUT AND WITH CONTRAST TECHNIQUE: Multiplanar, multiecho pulse sequences of the brain and surrounding structures were obtained without and with intravenous contrast. CONTRAST:  65mL GADAVIST GADOBUTROL 1 MMOL/ML IV SOLN COMPARISON:  None. FINDINGS: Motion limited exam. Brain: No acute infarction, hemorrhage, hydrocephalus, extra-axial collection or mass lesion. Mild scattered scattered T2/FLAIR hyperintensities in the white matter. No abnormal enhancement. Vascular: Major arterial flow voids are maintained at the skull base. Skull and upper cervical spine: Normal marrow signal. Sinuses/Orbits: Largely clear sinuses.  Unremarkable orbits. Other: Mastoid effusions IMPRESSION: 1. No evidence of acute abnormality on this motion limited exam. 2. Mild scattered T2/FLAIR hyperintensities within the white matter, which are nonspecific and not particularly advanced in number for age. Electronically Signed   By: Margaretha Sheffield M.D.   On: 02/07/2021 10:24   US Renal  Result Date: 02/06/2021 CLINICAL DATA:  Renal failure EXAM: RENAL / URINARY TRACT ULTRASOUND COMPLETE COMPARISON:  None. FINDINGS: Right Kidney: Renal measurements: 13.9 x 7 x 7.5 cm = volume: 380 mL. Cortex appears slightly echogenic. No mass or hydronephrosis. Left Kidney: Renal measurements: 12.8 x 7.9 x 6.5 cm = volume: 343 mL. Cortex appears slightly echogenic. No hydronephrosis. Suspect focally prominent cortical tissue/possible column of Bertin at the mid kidney. Bladder: Appears normal for degree of bladder distention. Other: Incidental right pleural effusion. IMPRESSION: Kidneys prominent in size. Cortex slightly echogenic consistent with medical renal disease. No hydronephrosis. Electronically Signed   By: Donavan Foil M.D.   On: 02/06/2021 21:35   MR [facility] RIGHT WO CONTRAST  Result Date: 02/09/2021 CLINICAL DATA:  Right thigh pain. EXAM:  MRI OF THE RIGHT FEMUR WITHOUT CONTRAST TECHNIQUE: Multiplanar, multisequence MR imaging of the right femur was performed. No intravenous contrast was administered. COMPARISON:  None. FINDINGS: Patchy asymmetric edema like signal changes in the thigh musculature bilaterally slightly more significant on right. This is multi compartmental but is involving the hamstring musculature more significantly than the quadriceps musculature. No mass or evidence of focal fluid collection to suggest pyomyositis. Moderate interfascial and perifascial fluid is noted along with subcutaneous soft tissue swelling/edema. The bony structures are unremarkable. IMPRESSION: 1. Patchy asymmetric edema like signal changes in the thigh musculature bilaterally slightly more significant on the right. Nonspecific myositis could be related to overuse syndrome, post viral or drug induced. No findings to suggest pyomyositis. 2. Moderate interfascial and perifascial fluid  along with subcutaneous soft tissue swelling/edema. 3. No acute bony findings. Electronically Signed   By: Marijo Sanes M.D.   On: 02/09/2021 09:32   IR Fluoro Guide CV Line Right  Result Date: 02/07/2021 INDICATION: ESRD requiring HD. EXAM: TUNNELED CENTRAL VENOUS HEMODIALYSIS CATHETER PLACEMENT WITH ULTRASOUND AND FLUOROSCOPIC GUIDANCE MEDICATIONS: Ancef 2 gm IV . The antibiotic was given in an appropriate time interval prior to skin puncture. ANESTHESIA/SEDATION: Moderate (conscious) sedation was employed during this procedure. A total of Versed 1 mg and Fentanyl 25 mcg was administered intravenously. Moderate Sedation Time: 16 minutes. The patient's level of consciousness and vital signs were monitored continuously by radiology nursing throughout the procedure under my direct supervision. FLUOROSCOPY TIME:  0 minutes 30 seconds (4 mGy). COMPLICATIONS: None immediate. PROCEDURE: Informed written consent was obtained from the the patient and/or patient's representative  after a discussion of the risks, benefits, and alternatives to treatment. Questions regarding the procedure were encouraged and answered. The RIGHT neck and chest were prepped with chlorhexidine in a sterile fashion, and a sterile drape was applied covering the operative field. Maximum barrier sterile technique with sterile gowns and gloves were used for the procedure. A timeout was performed prior to the initiation of the procedure. After creating a small venotomy incision, a micropuncture kit was utilized to access the internal jugular vein. Real-time ultrasound guidance was utilized for vascular access including the acquisition of a permanent ultrasound image documenting patency of the accessed vessel. The microwire was utilized to measure appropriate catheter length. A stiff Glidewire was advanced to the level of the IVC and the micropuncture sheath was exchanged for a peel-away sheath. A tunneled hemodialysis catheter measuring 23 cm from tip to cuff was tunneled in a retrograde fashion from the anterior chest wall to the venotomy incision. The catheter was then placed through the peel-away sheath with tips ultimately positioned within the superior aspect of the right atrium. Final catheter positioning was confirmed and documented with a spot radiographic image. The catheter aspirates and flushes normally. The catheter was flushed with appropriate volume heparin dwells. The catheter exit site was secured with a 0-Silk retention suture. The venotomy incision was closed with Dermabond. Dressings were applied. The patient tolerated the procedure well without immediate post procedural complication. IMPRESSION: Successful placement of 23 cm tunneled hemodialysis catheter via the RIGHT internal jugular vein, as above. The catheter is ready for immediate use. Michaelle Birks, MD [facility] Radiology Electronically Signed   By: Michaelle Birks M.D.   On: 02/07/2021 15:05    IR US Guide Vasc Access Right  Result Date: 02/07/2021 INDICATION: ESRD requiring HD. EXAM: TUNNELED CENTRAL VENOUS HEMODIALYSIS CATHETER PLACEMENT WITH ULTRASOUND AND FLUOROSCOPIC GUIDANCE MEDICATIONS: Ancef 2 gm IV . The antibiotic was given in an appropriate time interval prior to skin puncture. ANESTHESIA/SEDATION: Moderate (conscious) sedation was employed during this procedure. A total of Versed 1 mg and Fentanyl 25 mcg was administered intravenously. Moderate Sedation Time: 16 minutes. The patient's level of consciousness and vital signs were monitored continuously by radiology nursing throughout the procedure under my direct supervision. FLUOROSCOPY TIME:  0 minutes 30 seconds (4 mGy). COMPLICATIONS: None immediate. PROCEDURE: Informed written consent was obtained from the the patient and/or patient's representative after a discussion of the risks, benefits, and alternatives to treatment. Questions regarding the procedure were encouraged and answered. The RIGHT neck and chest were prepped with chlorhexidine in a sterile fashion, and a sterile drape was applied covering the operative field. Maximum  barrier sterile technique with sterile gowns and gloves were used for the procedure. A timeout was performed prior to the initiation of the procedure. After creating a small venotomy incision, a micropuncture kit was utilized to access the internal jugular vein. Real-time ultrasound guidance was utilized for vascular access including the acquisition of a permanent ultrasound image documenting patency of the accessed vessel. The microwire was utilized to measure appropriate catheter length. A stiff Glidewire was advanced to the level of the IVC and the micropuncture sheath was exchanged for a peel-away sheath. A tunneled hemodialysis catheter measuring 23 cm from tip to cuff was tunneled in a retrograde fashion from the anterior chest wall to the venotomy incision. The catheter was then placed through the  peel-away sheath with tips ultimately positioned within the superior aspect of the right atrium. Final catheter positioning was confirmed and documented with a spot radiographic image. The catheter aspirates and flushes normally. The catheter was flushed with appropriate volume heparin dwells. The catheter exit site was secured with a 0-Silk retention suture. The venotomy incision was closed with Dermabond. Dressings were applied. The patient tolerated the procedure well without immediate post procedural complication. IMPRESSION: Successful placement of 23 cm tunneled hemodialysis catheter via the RIGHT internal jugular vein, as above. The catheter is ready for immediate use. Roanna Banning, MD [facility] Radiology Electronically Signed   By: Roanna Banning M.D.   On: 02/07/2021 15:05   US BIOPSY (KIDNEY)  Result Date: 02/15/2021 INDICATION: Severe AKI. EXAM: ULTRASOUND GUIDED RENAL BIOPSY COMPARISON:  US Renal, 02/07/2019. MEDICATIONS: None. ANESTHESIA/SEDATION: Fentanyl 100 mcg IV; Versed 2.5 mg IV Total Moderate Sedation time: 24 minutes; The patient was continuously monitored during the procedure by the interventional radiology nurse under my direct supervision. COMPLICATIONS: None immediate. PROCEDURE: Informed written consent was obtained from the patient and/or patient's representative after a discussion of the risks, benefits and alternatives to treatment. The patient understands and consents the procedure. A timeout was performed prior to the initiation of the procedure. Ultrasound scanning was performed of the bilateral flanks. The inferior pole of the RIGHT kidney was selected for biopsy due to location and sonographic window. The procedure was planned. The operative site was prepped and draped in the usual sterile fashion. The overlying soft tissues were anesthetized with 1% lidocaine with epinephrine. A 17 gauge core needle biopsy device was advanced into  the inferior cortex of the RIGHT kidney and 3 core biopsies were obtained under direct ultrasound guidance. Images were saved for documentation purposes. The biopsy device was removed and hemostasis was obtained with the injection of Gelfoam slurry. Post procedural scanning was negative for significant post procedural hemorrhage or additional complication. A dressing was placed. The patient tolerated the procedure well without immediate post procedural complication. IMPRESSION: Successful ultrasound-guided RIGHT non-targeted renal biopsy, as above. Roanna Banning, MD [facility] Radiology Electronically Signed   By: Roanna Banning M.D.   On: 02/15/2021 16:26   VAS [place] LOWER EXTREMITY VENOUS (DVT)  Result Date: 02/07/2021  Lower Venous DVT Study Patient Name:  Erik Griffith  Date of Exam:   02/07/2021 Medical Rec #: [id number]     Accession #:    [id number] Date of Birth: 10-12-1985     Patient Gender: M Patient Age:   17 years Exam Location:  [facility] Procedure:      VAS [place] LOWER EXTREMITY VENOUS (DVT) Referring Phys: DAWOOD ELGERGAWY --------------------------------------------------------------------------------  Indications: Bilateral pain and weakness.  Comparison Study: No prior studies. Performing Technologist: Darlin Coco RDMS, RVT  Examination Guidelines: A complete evaluation includes B-mode imaging, spectral Doppler, color Doppler, and power Doppler as needed of all accessible portions of each vessel. Bilateral testing is considered an integral part of a complete examination. Limited examinations for reoccurring indications may be performed as noted. The reflux portion of the exam is performed with the patient in reverse Trendelenburg.  +---------+---------------+---------+-----------+----------+--------------+  RIGHT     Compressibility Phasicity Spontaneity Properties Thrombus Aging   +---------+---------------+---------+-----------+----------+--------------+  CFV       Full            Yes       Yes                                    +---------+---------------+---------+-----------+----------+--------------+  SFJ       Full                                                             +---------+---------------+---------+-----------+----------+--------------+  FV Prox   Full                                                             +---------+---------------+---------+-----------+----------+--------------+  FV Mid    Full                                                             +---------+---------------+---------+-----------+----------+--------------+  FV Distal Full                                                             +---------+---------------+---------+-----------+----------+--------------+  PFV       Full                                                             +---------+---------------+---------+-----------+----------+--------------+  POP       Full            Yes       Yes                                    +---------+---------------+---------+-----------+----------+--------------+  PTV       Full                                                             +---------+---------------+---------+-----------+----------+--------------+  PERO      Full                                                             +---------+---------------+---------+-----------+----------+--------------+   +---------+---------------+---------+-----------+----------+--------------+  LEFT      Compressibility Phasicity Spontaneity Properties Thrombus Aging  +---------+---------------+---------+-----------+----------+--------------+  CFV       Full            Yes       Yes                                    +---------+---------------+---------+-----------+----------+--------------+  SFJ       Full                                                              +---------+---------------+---------+-----------+----------+--------------+  FV Prox   Full                                                             +---------+---------------+---------+-----------+----------+--------------+  FV Mid    Full                                                             +---------+---------------+---------+-----------+----------+--------------+  FV Distal Full                                                             +---------+---------------+---------+-----------+----------+--------------+  PFV       Full                                                             +---------+---------------+---------+-----------+----------+--------------+  POP       Full            Yes       Yes                                    +---------+---------------+---------+-----------+----------+--------------+  PTV       Full                                                             +---------+---------------+---------+-----------+----------+--------------+  PERO      Full                                                             +---------+---------------+---------+-----------+----------+--------------+     Summary: RIGHT: - There is no evidence of deep vein thrombosis in the lower extremity.  - No cystic structure found in the popliteal fossa.  LEFT: - There is no evidence of deep vein thrombosis in the lower extremity.  - No cystic structure found in the popliteal fossa.  *See table(s) above for measurements and observations. Electronically signed by Harold Barban MD on 02/07/2021 at 8:03:23 PM.    Final      TODAY-DAY OF DISCHARGE:  Subjective:   Nicky Pugh today has no headache,no chest abdominal pain,no new weakness tingling or numbness, feels much better wants to go home today.   Objective:   Blood pressure (!) 167/102, pulse 98, temperature 98.1 F (36.7 C), temperature source Oral, resp. rate 17, height $RemoveBe'5\' 11"'skqlnsmwu$  (1.803 m), weight 106.8 kg, SpO2 94 %.  Intake/Output Summary  (Last 24 hours) at 02/17/2021 0939 Last data filed at 02/17/2021 8546 Gross per 24 hour  Intake 360 ml  Output 3450 ml  Net -3090 ml   Filed Weights   02/09/21 1055 02/12/21 0756 02/12/21 1234  Weight: 106.7 kg 106.8 kg 106.8 kg    Exam: Awake Alert, Oriented *3, No new F.N deficits, Normal affect [place].AT,PERRAL Supple Neck,No JVD, No cervical lymphadenopathy appriciated.  Symmetrical Chest wall movement, Good air movement bilaterally, CTAB RRR,No Gallops,Rubs or new Murmurs, No Parasternal Heave +ve B.Sounds, Abd Soft, Non tender, No organomegaly appriciated, No rebound -guarding or rigidity. No Cyanosis, Clubbing or edema, No new Rash or bruise   PERTINENT RADIOLOGIC STUDIES: US BIOPSY (KIDNEY)  Result Date: 02/15/2021 INDICATION: Severe AKI. EXAM: ULTRASOUND GUIDED RENAL BIOPSY COMPARISON:  US Renal, 02/07/2019. MEDICATIONS: None. ANESTHESIA/SEDATION: Fentanyl 100 mcg IV; Versed 2.5 mg IV Total Moderate Sedation time: 24 minutes; The patient was continuously monitored during the procedure by the interventional radiology nurse under my direct supervision. COMPLICATIONS: None immediate. PROCEDURE: Informed written consent was obtained from the patient and/or patient's representative after a discussion of the risks, benefits and alternatives to treatment. The patient understands and consents the procedure. A timeout was performed prior to the initiation of the procedure. Ultrasound scanning was performed of the bilateral flanks. The inferior pole of the RIGHT kidney was selected for biopsy due to location and sonographic window. The procedure was planned. The operative site was prepped and draped in the usual sterile fashion. The overlying soft tissues were anesthetized with 1% lidocaine with epinephrine. A 17 gauge core needle biopsy device was advanced into the inferior cortex of the RIGHT kidney and 3 core biopsies were obtained under direct ultrasound guidance. Images were saved for  documentation purposes. The biopsy device was removed and hemostasis was obtained with the injection of Gelfoam slurry. Post procedural scanning was negative for significant post procedural hemorrhage or additional complication. A dressing was placed. The patient tolerated the procedure well without immediate post procedural complication. IMPRESSION: Successful ultrasound-guided RIGHT non-targeted renal biopsy, as above. Michaelle Birks, MD [facility] Radiology Electronically Signed   By: Michaelle Birks M.D.   On: 02/15/2021 16:26     PERTINENT LAB RESULTS:  CBC: Recent Labs    02/16/21 0135 02/17/21 0037  WBC 11.4* 11.9*  HGB 9.7* 9.1*  HCT 29.2* 27.6*  PLT 436* 451*   CMET CMP     Component Value Date/Time   NA 135 02/17/2021 0037   K 4.1 02/17/2021 0037   CL 94 (L) 02/17/2021 0037   CO2 27 02/17/2021 0037   GLUCOSE 102 (H) 02/17/2021 0037   BUN 64 (H) 02/17/2021 0037   CREATININE 7.57 (H) 02/17/2021 0037   CALCIUM 12.0 (H) 02/17/2021 0037   PROT 5.5 (L) 02/07/2021 0705   ALBUMIN 3.2 (L) 02/17/2021 0037   AST 35 02/07/2021 0705   ALT 113 (H) 02/07/2021 0705   ALKPHOS 48 02/07/2021 0705   BILITOT 0.6 02/07/2021 0705   GFRNONAA 9 (L) 02/17/2021 0037   GFRAA >60 02/27/2019 0339    GFR Estimated Creatinine Clearance: 16.9 mL/min (A) (by C-G formula based on SCr of 7.57 mg/dL (H)). No results for input(s): LIPASE, AMYLASE in the last 72 hours. No results for input(s): CKTOTAL, CKMB, CKMBINDEX, TROPONINI in the last 72 hours. Invalid input(s): POCBNP No results for input(s): DDIMER in the last 72 hours. No results for input(s): HGBA1C in the last 72 hours. No results for input(s): CHOL, HDL, LDLCALC, TRIG, CHOLHDL, LDLDIRECT in the last 72 hours. No results for input(s): TSH, T4TOTAL, T3FREE, THYROIDAB in the last 72 hours.  Invalid input(s): FREET3 No results for input(s): VITAMINB12, FOLATE, FERRITIN, TIBC, IRON, RETICCTPCT in  the last 72 hours. Coags: Recent Labs    02/15/21 0142  INR 1.1   Microbiology: No results found for this or any previous visit (from the past 240 hour(s)).  FURTHER DISCHARGE INSTRUCTIONS:  Get Medicines reviewed and adjusted: Please take all your medications with you for your next visit with your Primary MD  Laboratory/radiological data: Please request your Primary MD to go over all hospital tests and procedure/radiological results at the follow up, please ask your Primary MD to get all Hospital records sent to his/her office.  In some cases, they will be blood work, cultures and biopsy results pending at the time of your discharge. Please request that your primary care M.D. goes through all the records of your hospital data and follows up on these results.  Also Note the following: If you experience worsening of your admission symptoms, develop shortness of breath, life threatening emergency, suicidal or homicidal thoughts you must seek medical attention immediately by calling 911 or calling your MD immediately  if symptoms less severe.  You must read complete instructions/literature along with all the possible adverse reactions/side effects for all the Medicines you take and that have been prescribed to you. Take any new Medicines after you have completely understood and accpet all the possible adverse reactions/side effects.   Do not drive when taking Pain medications or sleeping medications (Benzodaizepines)  Do not take more than prescribed Pain, Sleep and Anxiety Medications. It is not advisable to combine anxiety,sleep and pain medications without talking with your primary care practitioner  Special Instructions: If you have smoked or chewed Tobacco  in the last 2 yrs please stop smoking, stop any regular Alcohol  and or any Recreational drug use.  Wear Seat belts while driving.  Please note: You were cared for by a hospitalist during your hospital stay. Once you are  discharged, your primary care physician will handle any further medical issues. Please note that NO REFILLS for any discharge medications will be authorized once you are discharged, as it is  imperative that you return to your primary care physician (or establish a relationship with a primary care physician if you do not have one) for your post hospital discharge needs so that they can reassess your need for medications and monitor your lab values.  Total Time spent coordinating discharge including counseling, education and face to face time equals 35 minutes.  SignedOren Binet 02/17/2021 9:39 AM

## 2021-02-17 NOTE — TOC Transition Note (Signed)
Transition of Care [facility]) - CM/SW Discharge Note   Patient Details  Name: Erik Griffith MRN: [id number] Date of Birth: 11/22/1985  [facility]) CM/SW Contact:  Lawerance Sabal, RN Phone Number: 02/17/2021, 10:35 AM   Clinical Narrative:    Unable to schedule appointment for PCP on Sunday. Provided patient list of [facility] to choose from and call tomorrow to schedule PCP appointment. Provided with MATCH letter. No other TOC needs      Barriers to Discharge: Continued Medical Work up   Patient Goals and CMS Choice Patient states their goals for this hospitalization and ongoing recovery are:: Wants to get better to go home CMS Medicare.gov Compare Post Acute Care list provided to::  (n/a) Choice offered to / list presented to :  (n/a)  Discharge Placement                       Discharge Plan and Services In-house Referral: NA Discharge Planning Services: CM Consult            DME Arranged: N/A DME Agency: NA       HH Arranged: NA HH Agency: NA        Social Determinants of Health (SDOH) Interventions     Readmission Risk Interventions No flowsheet data found.

## 2021-02-21 LAB — SURGICAL PATHOLOGY

## 2021-02-26 ENCOUNTER — Encounter (HOSPITAL_COMMUNITY): Payer: Self-pay

## 2021-05-30 ENCOUNTER — Encounter (HOSPITAL_BASED_OUTPATIENT_CLINIC_OR_DEPARTMENT_OTHER): Payer: Self-pay | Admitting: Emergency Medicine

## 2021-05-30 ENCOUNTER — Other Ambulatory Visit: Payer: Self-pay

## 2021-05-30 ENCOUNTER — Emergency Department (HOSPITAL_BASED_OUTPATIENT_CLINIC_OR_DEPARTMENT_OTHER): Payer: Medicaid Other

## 2021-05-30 ENCOUNTER — Emergency Department (HOSPITAL_BASED_OUTPATIENT_CLINIC_OR_DEPARTMENT_OTHER)
Admission: EM | Admit: 2021-05-30 | Discharge: 2021-05-30 | Disposition: A | Discharge status: Home / Self Care | Payer: Medicaid Other | Attending: Emergency Medicine | Admitting: Emergency Medicine

## 2021-05-30 DIAGNOSIS — Y92410 Unspecified street and highway as the place of occurrence of the external cause: Secondary | ICD-10-CM | POA: Diagnosis not present

## 2021-05-30 DIAGNOSIS — S060X0A Concussion without loss of consciousness, initial encounter: Secondary | ICD-10-CM | POA: Diagnosis not present

## 2021-05-30 DIAGNOSIS — S161XXA Strain of muscle, fascia and tendon at neck level, initial encounter: Secondary | ICD-10-CM | POA: Diagnosis not present

## 2021-05-30 DIAGNOSIS — S0990XA Unspecified injury of head, initial encounter: Secondary | ICD-10-CM | POA: Diagnosis present
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
## 2021-05-30 MED ORDER — CYCLOBENZAPRINE HCL 10 MG PO TABS: 10.0000 mg | ORAL_TABLET | Freq: Two times a day (BID) | ORAL | 0 refills | Status: DC | PRN

## 2021-05-30 MED ORDER — ACETAMINOPHEN 500 MG PO TABS
1000.0000 mg | ORAL_TABLET | Freq: Once | ORAL | Status: AC
Start: 2021-05-30 — End: 2021-05-30
  Administered 2021-05-30: 1000 mg via ORAL
  Filled 2021-05-30: qty 2

## 2021-05-30 MED ORDER — IBUPROFEN 400 MG PO TABS
400.0000 mg | ORAL_TABLET | Freq: Once | ORAL | Status: AC
Start: 2021-05-30 — End: 2021-05-30
  Administered 2021-05-30: 400 mg via ORAL
  Filled 2021-05-30: qty 1
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
## 2021-05-30 NOTE — ED Notes (Signed)
Patient transported to CT 
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
## 2021-05-30 NOTE — Discharge Instructions (Addendum)
You can continue to take Tylenol, you can use Voltaren gel which is over-the-counter for pain.  Avoid ibuprofen due to your prior kidney issues.  You are also given a prescription for a muscle relaxer.  Will be very important for you to rest, stay hydrated and avoid eyestrain or excessive physical activity until you start feeling better. ?

## 2021-06-05 ENCOUNTER — Ambulatory Visit: Payer: Medicaid Other | Admitting: Sports Medicine
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
## 2021-06-10 ENCOUNTER — Ambulatory Visit (INDEPENDENT_AMBULATORY_CARE_PROVIDER_SITE_OTHER): Payer: Medicaid Other | Admitting: Family Medicine

## 2021-06-10 VITALS — BP 166/112 | HR 106 | Ht 71.0 in | Wt 231.8 lb

## 2021-06-10 DIAGNOSIS — M5412 Radiculopathy, cervical region: Secondary | ICD-10-CM

## 2021-06-10 DIAGNOSIS — S060X0A Concussion without loss of consciousness, initial encounter: Secondary | ICD-10-CM | POA: Diagnosis not present

## 2021-06-10 DIAGNOSIS — N289 Disorder of kidney and ureter, unspecified: Secondary | ICD-10-CM

## 2021-06-10 DIAGNOSIS — R42 Dizziness and giddiness: Secondary | ICD-10-CM | POA: Diagnosis not present

## 2021-06-10 LAB — RENAL FUNCTION PANEL
Albumin: 4.8 g/dL (ref 3.5–5.2)
BUN: 16 mg/dL (ref 6–23)
CO2: 27 mEq/L (ref 19–32)
Calcium: 10.4 mg/dL (ref 8.4–10.5)
Chloride: 100 mEq/L (ref 96–112)
Creatinine, Ser: 1.14 mg/dL (ref 0.40–1.50)
GFR: 82.91 mL/min (ref >60.00)
Glucose, Bld: 97 mg/dL (ref 70–99)
Phosphorus: 3.5 mg/dL (ref 2.3–4.6)
Potassium: 3.8 mEq/L (ref 3.5–5.1)
Sodium: 137 mEq/L (ref 135–145)

## 2021-06-10 MED ORDER — GABAPENTIN 300 MG PO CAPS: 300.0000 mg | ORAL_CAPSULE | Freq: Three times a day (TID) | ORAL | 3 refills | Status: AC | PRN

## 2021-06-10 MED ORDER — PREDNISONE 50 MG PO TABS: 50.0000 mg | ORAL_TABLET | Freq: Every day | ORAL | 0 refills | Status: DC

## 2021-06-10 MED ORDER — NORTRIPTYLINE HCL 25 MG PO CAPS: 25.0000 mg | ORAL_CAPSULE | Freq: Every day | ORAL | 2 refills | Status: DC
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
## 2021-06-10 NOTE — Patient Instructions (Addendum)
Thank you for coming in today.  ? ?Please get labs today before you leave  ? ?Recheck in 2 weeks ? ?I've referred you to Physical Therapy.  Let us know if you don't hear from them in one week.  ? ? ?

## 2021-06-11 ENCOUNTER — Ambulatory Visit: Payer: Medicaid Other | Attending: Family Medicine | Admitting: Physical Therapy

## 2021-06-11 ENCOUNTER — Encounter: Payer: Self-pay | Admitting: Physical Therapy

## 2021-06-11 VITALS — BP 131/94 | HR 99

## 2021-06-11 DIAGNOSIS — R42 Dizziness and giddiness: Secondary | ICD-10-CM | POA: Diagnosis present

## 2021-06-11 DIAGNOSIS — M542 Cervicalgia: Secondary | ICD-10-CM | POA: Insufficient documentation

## 2021-06-11 DIAGNOSIS — S060X0A Concussion without loss of consciousness, initial encounter: Secondary | ICD-10-CM | POA: Insufficient documentation

## 2021-06-11 DIAGNOSIS — M5412 Radiculopathy, cervical region: Secondary | ICD-10-CM | POA: Insufficient documentation

## 2021-06-11 DIAGNOSIS — R2681 Unsteadiness on feet: Secondary | ICD-10-CM | POA: Diagnosis present

## 2021-06-11 NOTE — Progress Notes (Signed)
Kidney function labs have returned to normal.
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
## 2021-06-18 ENCOUNTER — Ambulatory Visit: Payer: Medicaid Other | Admitting: Physical Therapy

## 2021-06-24 ENCOUNTER — Ambulatory Visit (INDEPENDENT_AMBULATORY_CARE_PROVIDER_SITE_OTHER): Payer: Medicaid Other | Admitting: Family Medicine

## 2021-06-24 VITALS — BP 160/98 | HR 98 | Ht 71.0 in | Wt 228.4 lb

## 2021-06-24 DIAGNOSIS — S060X0D Concussion without loss of consciousness, subsequent encounter: Secondary | ICD-10-CM

## 2021-06-24 DIAGNOSIS — M542 Cervicalgia: Secondary | ICD-10-CM | POA: Diagnosis not present

## 2021-06-24 DIAGNOSIS — M5412 Radiculopathy, cervical region: Secondary | ICD-10-CM

## 2021-06-24 MED ORDER — CYCLOBENZAPRINE HCL 10 MG PO TABS: 10.0000 mg | ORAL_TABLET | Freq: Two times a day (BID) | ORAL | 1 refills | Status: DC | PRN

## 2021-06-24 MED ORDER — TOPIRAMATE 50 MG PO TABS: ORAL_TABLET | ORAL | 1 refills | Status: DC

## 2021-06-24 NOTE — Patient Instructions (Addendum)
Thank you for coming in today.  ? ?I've sent those prescription to your pharmacy.  ? ?You should hear from MRI scheduling within 1 week. If you do not hear please let me know.   ? ?STOP nortriptyline.  ? ?START topiramate for headache prevention.  ? ?We will try to change around PT.  ? ?Recheck after the MRI or in 2 weeks.  ? ? ? ?  ?
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
## 2021-06-28 ENCOUNTER — Telehealth: Payer: Self-pay | Admitting: Family Medicine

## 2021-06-28 DIAGNOSIS — M5412 Radiculopathy, cervical region: Secondary | ICD-10-CM

## 2021-06-28 DIAGNOSIS — M542 Cervicalgia: Secondary | ICD-10-CM

## 2021-06-28 NOTE — Telephone Encounter (Signed)
Called and spoke to pt's wife regarding new PT referral to [facility] to address neck pain and cervical radiculopathy and provide phone number for [facility] PT.  She verbalizes understanding.

## 2021-06-28 NOTE — Telephone Encounter (Signed)
Will add regular PT for dry needling at [facility] in addition to the vestibular physical therapy.  Please call [phone] to schedule

## 2021-06-28 NOTE — Telephone Encounter (Signed)
-----   Message from Arliss Journey, PT sent at 06/24/2021  4:27 PM EDT ----- Regarding: RE: Neck Hey Dr. Georgina Snell,  I think he would benefit from more conventional ortho PT if it is mainly neck pain/radiculopathy. We don't have a therapist here anymore that does dry needling, so it might be helpful for a referral for an ortho PT clinic. I know he mentioned he was seeing a chiropractor for his neck pain as well.   Thanks, Chloe ----- Message ----- From: Gregor Hams, MD Sent: 06/24/2021   3:32 PM EDT To: Arliss Journey, PT Subject: Neck                                           I referred Erik Griffith to you mostly for vestibular PT. But his dominant issue currently is neck pain and cervical radiculopathy. Can you work on these issues or should he go to more conventional PT? I think he would benefit from dry needling.   Ellard Artis

## 2021-06-29 ENCOUNTER — Other Ambulatory Visit: Payer: Medicaid Other

## 2021-06-30 ENCOUNTER — Other Ambulatory Visit: Payer: Medicaid Other

## 2021-07-01 ENCOUNTER — Ambulatory Visit: Payer: Medicaid Other | Admitting: Physical Therapy

## 2021-07-01 ENCOUNTER — Encounter: Payer: Self-pay | Admitting: Physical Therapy

## 2021-07-01 VITALS — BP 125/93 | HR 96

## 2021-07-01 DIAGNOSIS — R2681 Unsteadiness on feet: Secondary | ICD-10-CM

## 2021-07-01 DIAGNOSIS — R42 Dizziness and giddiness: Secondary | ICD-10-CM
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
## 2021-07-01 NOTE — Patient Instructions (Signed)
Gaze Stabilization: Sitting    Keeping eyes on target on wall a couple feet away, tilt head down 15-30 and move head side to side for __30__ seconds. Repeat while moving head up and down for __30__ seconds.  Perform 3 times each direction.   Do __2__ sessions per day.  Gaze Stabilization: Tip Card  1.Target must remain in focus, not blurry, and appear stationary while head is in motion. 2.Perform exercises with small head movements (45 to either side of midline). 3.Increase speed of head motion so long as target is in focus. 4.If you wear eyeglasses, be sure you can see target through lens (therapist will give specific instructions for bifocal / progressive lenses). 5.These exercises may provoke dizziness or nausea. Work through these symptoms. If too dizzy, slow head movement slightly. Rest between each exercise. 6.Exercises demand concentration; avoid distractions. 7.For safety, perform standing exercises close to a counter, wall, corner, or next to someone.  Copyright  VHI. All rights reserved.     Copyright  VHI. All rights reserved.

## 2021-07-03 ENCOUNTER — Ambulatory Visit: Payer: Medicaid Other | Admitting: Physical Therapy

## 2021-07-06 ENCOUNTER — Ambulatory Visit (INDEPENDENT_AMBULATORY_CARE_PROVIDER_SITE_OTHER): Payer: Medicaid Other

## 2021-07-06 DIAGNOSIS — M5412 Radiculopathy, cervical region: Secondary | ICD-10-CM | POA: Diagnosis not present

## 2021-07-06 DIAGNOSIS — S199XXA Unspecified injury of neck, initial encounter: Secondary | ICD-10-CM | POA: Diagnosis not present
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
## 2021-07-09 ENCOUNTER — Ambulatory Visit: Payer: Medicaid Other | Admitting: Physical Therapy

## 2021-07-10 NOTE — Progress Notes (Signed)
MRI cervical spine shows the potential for the left C6 nerve to get pinched.  Additionally evidence of muscle spasm is present in the neck.  Recommend return to clinic to talk about the results in full detail as well as discuss treatment plan options.  This should be a concussion visit as well as a MRI review visit.

## 2021-07-11 ENCOUNTER — Encounter: Payer: Medicaid Other | Admitting: Physical Therapy

## 2021-07-15 ENCOUNTER — Ambulatory Visit: Payer: Medicaid Other
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
## 2021-07-16 ENCOUNTER — Encounter: Payer: Medicaid Other | Admitting: Physical Therapy

## 2021-07-18 ENCOUNTER — Ambulatory Visit: Payer: Medicaid Other | Admitting: Physical Therapy

## 2021-07-22 ENCOUNTER — Other Ambulatory Visit: Payer: Self-pay | Admitting: Family Medicine

## 2021-07-22 ENCOUNTER — Ambulatory Visit: Payer: Medicaid Other | Admitting: Physical Therapy

## 2021-07-22 ENCOUNTER — Ambulatory Visit (INDEPENDENT_AMBULATORY_CARE_PROVIDER_SITE_OTHER): Payer: Medicaid Other | Admitting: Family Medicine

## 2021-07-22 VITALS — BP 142/90 | HR 116 | Ht 71.0 in | Wt 225.0 lb

## 2021-07-22 DIAGNOSIS — M5412 Radiculopathy, cervical region: Secondary | ICD-10-CM

## 2021-07-22 DIAGNOSIS — S060X0D Concussion without loss of consciousness, subsequent encounter: Secondary | ICD-10-CM

## 2021-07-22 MED ORDER — CYCLOBENZAPRINE HCL 10 MG PO TABS: 10.0000 mg | ORAL_TABLET | Freq: Two times a day (BID) | ORAL | 1 refills | Status: AC | PRN

## 2021-07-22 MED ORDER — PREDNISONE 50 MG PO TABS: 50.0000 mg | ORAL_TABLET | Freq: Every day | ORAL | 0 refills | Status: DC
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
## 2021-07-22 NOTE — Patient Instructions (Addendum)
Thank you for coming in today.   Glad you are feeling better!  Check back in 1 month for your neck

## 2021-07-23 ENCOUNTER — Ambulatory Visit: Payer: Medicaid Other | Attending: Family Medicine
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
## 2021-07-24 ENCOUNTER — Encounter: Payer: Medicaid Other | Admitting: Physical Therapy

## 2021-07-30 ENCOUNTER — Encounter: Payer: Self-pay | Admitting: Family Medicine

## 2021-07-30 ENCOUNTER — Encounter: Payer: Medicaid Other | Admitting: Physical Therapy

## 2021-07-31 ENCOUNTER — Other Ambulatory Visit: Payer: Self-pay | Admitting: Family Medicine

## 2021-08-01 ENCOUNTER — Ambulatory Visit: Payer: Medicaid Other | Admitting: Physical Therapy

## 2021-08-01 NOTE — Telephone Encounter (Signed)
Rx refill request approved per Dr. Corey's orders. 

## 2021-08-05 ENCOUNTER — Ambulatory Visit: Payer: Medicaid Other | Admitting: Physical Therapy

## 2021-08-07 ENCOUNTER — Encounter: Payer: Medicaid Other | Admitting: Physical Therapy

## 2021-08-09 ENCOUNTER — Encounter: Payer: Self-pay | Admitting: Family Medicine
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
## 2021-08-17 ENCOUNTER — Other Ambulatory Visit: Payer: Self-pay | Admitting: Family Medicine

## 2021-08-20 ENCOUNTER — Other Ambulatory Visit: Payer: Self-pay | Admitting: Family Medicine

## 2021-08-21 ENCOUNTER — Ambulatory Visit: Payer: Medicaid Other | Admitting: Family Medicine
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
## 2021-08-25 ENCOUNTER — Other Ambulatory Visit: Payer: Self-pay | Admitting: Family Medicine

## 2022-02-01 ENCOUNTER — Other Ambulatory Visit: Payer: Self-pay

## 2022-02-01 ENCOUNTER — Emergency Department (HOSPITAL_COMMUNITY): Payer: Medicaid Other

## 2022-02-01 ENCOUNTER — Encounter (HOSPITAL_COMMUNITY): Payer: Self-pay

## 2022-02-01 ENCOUNTER — Emergency Department (HOSPITAL_COMMUNITY)
Admission: EM | Admit: 2022-02-01 | Discharge: 2022-02-02 | Disposition: A | Discharge status: Home / Self Care | Payer: Medicaid Other | Attending: Emergency Medicine | Admitting: Emergency Medicine

## 2022-02-01 DIAGNOSIS — S79912A Unspecified injury of left hip, initial encounter: Secondary | ICD-10-CM | POA: Diagnosis present

## 2022-02-01 DIAGNOSIS — X58XXXA Exposure to other specified factors, initial encounter: Secondary | ICD-10-CM | POA: Insufficient documentation

## 2022-02-01 DIAGNOSIS — S73005A Unspecified dislocation of left hip, initial encounter: Secondary | ICD-10-CM | POA: Insufficient documentation

## 2022-02-01 LAB — CBC WITH DIFFERENTIAL/PLATELET
Abs Immature Granulocytes: 0.02 10*3/uL (ref 0.00–0.07)
Basophils Absolute: 0 10*3/uL (ref 0.0–0.1)
Basophils Relative: 0 %
Eosinophils Absolute: 0 10*3/uL (ref 0.0–0.5)
Eosinophils Relative: 0 %
HCT: 37.7 % — ABNORMAL LOW (ref 39.0–52.0)
Hemoglobin: 12.6 g/dL — ABNORMAL LOW (ref 13.0–17.0)
Immature Granulocytes: 0 %
Lymphocytes Relative: 21 %
Lymphs Abs: 2.4 10*3/uL (ref 0.7–4.0)
MCH: 30.1 pg (ref 26.0–34.0)
MCHC: 33.4 g/dL (ref 30.0–36.0)
MCV: 90 fL (ref 80.0–100.0)
Monocytes Absolute: 0.7 10*3/uL (ref 0.1–1.0)
Monocytes Relative: 6 %
Neutro Abs: 8.3 10*3/uL — ABNORMAL HIGH (ref 1.7–7.7)
Neutrophils Relative %: 73 %
Platelets: 275 10*3/uL (ref 150–400)
RBC: 4.19 MIL/uL — ABNORMAL LOW (ref 4.22–5.81)
RDW: 13.3 % (ref 11.5–15.5)
WBC: 11.4 10*3/uL — ABNORMAL HIGH (ref 4.0–10.5)
nRBC: 0 % (ref 0.0–0.2)

## 2022-02-01 MED ORDER — PROPOFOL 10 MG/ML IV BOLUS
0.5000 mg/kg | Freq: Once | INTRAVENOUS | Status: AC
  Administered 2022-02-02: 48.3 mg via INTRAVENOUS
  Filled 2022-02-01: qty 20

## 2022-02-01 MED ORDER — PROPOFOL 10 MG/ML IV BOLUS
INTRAVENOUS | Status: AC | PRN
  Administered 2022-02-01: 25 mg via INTRAVENOUS
  Administered 2022-02-01: 50 mg via INTRAVENOUS
  Administered 2022-02-01: 25 mg via INTRAVENOUS

## 2022-02-01 MED ORDER — FENTANYL CITRATE PF 50 MCG/ML IJ SOSY
100.0000 ug | PREFILLED_SYRINGE | Freq: Once | INTRAMUSCULAR | Status: AC
  Administered 2022-02-01: 100 ug via INTRAVENOUS
  Filled 2022-02-01: qty 2

## 2022-02-01 MED ORDER — FENTANYL CITRATE PF 50 MCG/ML IJ SOSY
50.0000 ug | PREFILLED_SYRINGE | Freq: Once | INTRAMUSCULAR | Status: AC
  Administered 2022-02-01: 50 ug via INTRAVENOUS
  Filled 2022-02-01: qty 1

## 2022-02-01 MED ORDER — SODIUM CHLORIDE 0.9 % IV BOLUS
1000.0000 mL | Freq: Once | INTRAVENOUS | Status: AC
  Administered 2022-02-01: 1000 mL via INTRAVENOUS
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
## 2022-02-01 NOTE — ED Provider Notes (Signed)
.  Sedation  Date/Time: 02/01/2022 11:52 PM  Performed by: Tilden Fossa, MD Authorized by: Tilden Fossa, MD   Consent:    Consent obtained:  Verbal   Consent given by:  Patient   Risks discussed:  Prolonged hypoxia resulting in organ damage, prolonged sedation necessitating reversal, dysrhythmia, inadequate sedation, nausea and vomiting   Alternatives discussed:  Analgesia without sedation Universal protocol:    Immediately prior to procedure, a time out was called: yes   Indications:    Procedure performed:  Dislocation reduction   Procedure necessitating sedation performed by:  Different physician Pre-sedation assessment:    Time since last food or drink:  12   ASA classification: class 1 - normal, healthy patient     Mouth opening:  3 or more finger widths   Mallampati score:  II - soft palate, uvula, fauces visible   Neck mobility: normal     Pre-sedation assessments completed and reviewed: airway patency, cardiovascular function, hydration status, mental status, nausea/vomiting, pain level, respiratory function and temperature   Immediate pre-procedure details:    Reassessment: Patient reassessed immediately prior to procedure     Verified: bag valve mask available, emergency equipment available, intubation equipment available, IV patency confirmed and oxygen available   Procedure details (see MAR for exact dosages):    Preoxygenation:  Nasal cannula   Sedation:  Propofol   Intended level of sedation: deep   Analgesia:  Fentanyl   Intra-procedure monitoring:  Blood pressure monitoring, continuous capnometry, cardiac monitor, continuous pulse oximetry, frequent LOC assessments and frequent vital sign checks   Intra-procedure events: none     Intra-procedure management:  Airway repositioning   Total Provider sedation time (minutes):  20 Post-procedure details:    Post-sedation assessment completed:  02/01/2022 11:54 PM   Procedure completion:  Tolerated well, no immediate  complications     Tilden Fossa, MD 02/02/22 [phone]

## 2022-02-02 ENCOUNTER — Emergency Department (HOSPITAL_COMMUNITY): Payer: Medicaid Other

## 2022-02-02 LAB — BASIC METABOLIC PANEL
Anion gap: 12 (ref 5–15)
BUN: 16 mg/dL (ref 6–20)
CO2: 22 mmol/L (ref 22–32)
Calcium: 9 mg/dL (ref 8.9–10.3)
Chloride: 108 mmol/L (ref 98–111)
Creatinine, Ser: 0.83 mg/dL (ref 0.61–1.24)
GFR, Estimated: >60 mL/min (ref >60)
Glucose, Bld: 81 mg/dL (ref 70–99)
Potassium: 3.7 mmol/L (ref 3.5–5.1)
Sodium: 142 mmol/L (ref 135–145)

## 2022-02-02 MED ORDER — HYDROMORPHONE HCL 1 MG/ML IJ SOLN
0.5000 mg | Freq: Once | INTRAMUSCULAR | Status: AC
  Administered 2022-02-02: 0.5 mg via INTRAVENOUS
  Filled 2022-02-02: qty 1

## 2022-02-02 MED ORDER — ONDANSETRON HCL 4 MG PO TABS: 4.0000 mg | ORAL_TABLET | Freq: Four times a day (QID) | ORAL | 0 refills | Status: AC

## 2022-02-02 MED ORDER — OXYCODONE-ACETAMINOPHEN 5-325 MG PO TABS: 1.0000 | ORAL_TABLET | Freq: Three times a day (TID) | ORAL | 0 refills | Status: AC | PRN

## 2022-02-02 MED ORDER — OXYCODONE-ACETAMINOPHEN 5-325 MG PO TABS: 1.0000 | ORAL_TABLET | Freq: Three times a day (TID) | ORAL | 0 refills | Status: DC | PRN

## 2022-02-02 NOTE — Discharge Instructions (Signed)
You have a left hip fracture which we have reduced, I have given you a knee immobilizer please wear at all times, you may toe-touch with that left knee as tolerated, use the crutches that were provided to you.  Recommend ibuprofen and Tylenol for pain control.  I have given you a short course of narcotics please take as prescribed.  This medication can make you drowsy do not consume alcohol or operate heavy machinery when taking this medication.  This medication is Tylenol in it do not take Tylenol and take this medication.      Please follow-up with orthopedics please call them for a follow-up this week.  Come back to the emergency department if you develop chest pain, shortness of breath, severe abdominal pain, uncontrolled nausea, vomiting, diarrhea.

## 2022-02-02 NOTE — ED Provider Notes (Signed)
[facility] DEPT Provider Note   CSN: [id number] Arrival date & time: 02/01/22  2133     History  Chief Complaint  Patient presents with   Hip Injury    Erik Griffith is a 36 y.o. male.  HPI   Patient without significant medical history presents with complaints of left-sided hip pain.  Patient states around 8 PM yesterday he was roughhousing with his cousin, he states that he did this split and felt as if his left hip popped out.  He denies hitting his head or losing conscious, he is not endorsing any neck pain back pain, pain is just in his left hip does not radiate, no paresthesia or weakness moving down his leg no saddle paresthesias no urinary or bowel incontinency's.  No history of hip dislocations, no other complaints.    Home Medications Prior to Admission medications   Medication Sig Start Date End Date Taking? Authorizing Provider  naproxen sodium (ALEVE) 220 MG tablet Take 220 mg by mouth daily as needed (pain).   Yes [provider]  ondansetron (ZOFRAN) 4 MG tablet Take 1 tablet (4 mg total) by mouth every 6 (six) hours. 02/02/22  Yes Marcello Fennel, PA-C  oxyCODONE-acetaminophen (PERCOCET/ROXICET) 5-325 MG tablet Take 1 tablet by mouth every 8 (eight) hours as needed for up to 4 days for severe pain. 02/02/22 02/06/22 Yes Marcello Fennel, PA-C  acetaminophen (TYLENOL) 325 MG tablet Take 2 tablets (650 mg total) by mouth every 6 (six) hours. Patient not taking: Reported on 02/02/2022 03/07/19   Milly Jakob, MD  amLODipine (NORVASC) 10 MG tablet Take 1 tablet (10 mg total) by mouth daily. Patient not taking: Reported on 02/02/2022 02/18/21   Jonetta Osgood, MD  carvedilol (COREG) 25 MG tablet Take 1 tablet (25 mg total) by mouth 2 (two) times daily with a meal. Patient not taking: Reported on 02/02/2022 02/17/21   Jonetta Osgood, MD  cyclobenzaprine (FLEXERIL) 10 MG tablet Take 1 tablet (10 mg total) by mouth 2 (two)  times daily as needed for muscle spasms. Patient not taking: Reported on 02/02/2022 07/22/21   Gregor Hams, MD  gabapentin (NEURONTIN) 300 MG capsule Take 1 capsule (300 mg total) by mouth 3 (three) times daily as needed. Patient not taking: Reported on 02/02/2022 06/10/21   Gregor Hams, MD  hydrALAZINE (APRESOLINE) 100 MG tablet Take 0.5 tablets (50 mg total) by mouth every 12 (twelve) hours. Patient not taking: Reported on 02/02/2022 02/17/21   Jonetta Osgood, MD  predniSONE (DELTASONE) 50 MG tablet Take 1 tablet (50 mg total) by mouth daily. Patient not taking: Reported on 02/02/2022 07/22/21   Gregor Hams, MD  sevelamer carbonate (RENVELA) 800 MG tablet Take 3 tablets (2,400 mg total) by mouth 3 (three) times daily with meals. Patient not taking: Reported on 02/02/2022 02/17/21   Jonetta Osgood, MD  topiramate (TOPAMAX) 50 MG tablet Take 1 tablet (50 mg total) by mouth daily. Patient not taking: Reported on 02/02/2022 08/26/21   Gregor Hams, MD      Allergies    Patient has no known allergies.    Review of Systems   Review of Systems  Constitutional:  Negative for chills and fever.  Respiratory:  Negative for shortness of breath.   Cardiovascular:  Negative for chest pain.  Gastrointestinal:  Negative for abdominal pain.  Musculoskeletal:        Hip pain  Neurological:  Negative for headaches.  Physical Exam Updated Vital Signs BP (!) 125/97   Pulse 93   Temp 98.8 F (37.1 C)   Resp 16   Ht 6' (1.829 m)   Wt 96.6 kg   SpO2 100%   BMI 28.89 kg/m  Physical Exam Vitals and nursing note reviewed.  Constitutional:      General: He is not in acute distress.    Appearance: He is not ill-appearing.  HENT:     Head: Normocephalic and atraumatic.     Comments: There is no deformity of the head present no raccoon eyes or Battle sign noted.    Nose: No congestion.     Mouth/Throat:     Mouth: Mucous membranes are moist.     Pharynx: Oropharynx is clear.      Comments: No trismus no torticollis no oral trauma Eyes:     Extraocular Movements: Extraocular movements intact.     Conjunctiva/sclera: Conjunctivae normal.  Cardiovascular:     Rate and Rhythm: Normal rate and regular rhythm.     Pulses: Normal pulses.     Heart sounds: No murmur heard.    No friction rub. No gallop.  Pulmonary:     Effort: No respiratory distress.     Breath sounds: No wheezing, rhonchi or rales.  Abdominal:     Palpations: Abdomen is soft.     Tenderness: There is no abdominal tenderness. There is no right CVA tenderness or left CVA tenderness.  Musculoskeletal:     Comments: Patient is moving his upper extremities out difficulty, upper extremities are nontender to palpation, right lower extremity was nontender to palpation is moving at his toes ankle and knee and hip without difficulty, patient is able to move at his left toe ankle knee but unable to flex at the hip due to pain, he is notably tender on his left hip, with no internal or external rotation present.  He has 2+ dorsal pedal pulses, sensation intact to light touch bilaterally.  Skin:    General: Skin is warm and dry.  Neurological:     Mental Status: He is alert.     Comments: No facial asymmetry no difficulty with word finding following two-step commands there is no unilateral weakness present.  Psychiatric:        Mood and Affect: Mood normal.     ED Results / Procedures / Treatments   Labs (all labs ordered are listed, but only abnormal results are displayed) Labs Reviewed  CBC WITH DIFFERENTIAL/PLATELET - Abnormal; Notable for the following components:      Result Value   WBC 11.4 (*)    RBC 4.19 (*)    Hemoglobin 12.6 (*)    HCT 37.7 (*)    Neutro Abs 8.3 (*)    All other components within normal limits  BASIC METABOLIC PANEL    EKG None  Radiology DG Pelvis 1-2 Views  Result Date: 02/02/2022 CLINICAL DATA:  Status post left hip reduction EXAM: PELVIS - 1-2 VIEW COMPARISON:   02/01/2022 FINDINGS: Reduction of the left hip dislocation. The left femoral head is well seated within the acetabulum. No evidence of fracture. IMPRESSION: Successful reduction of the previous left hip dislocation. Electronically Signed   By: Placido Sou M.D.   On: 02/02/2022 00:12   DG Pelvis Portable  Result Date: 02/01/2022 CLINICAL DATA:  Left hip injury while rough housing EXAM: PORTABLE PELVIS 1-2 VIEWS COMPARISON:  None Available. FINDINGS: Inferior dislocation of the left femoral head in relation to the acetabulum.  No definite acute fracture. IMPRESSION: Dislocation of the left femoral head. Electronically Signed   By: Placido Sou M.D.   On: 02/01/2022 23:02    Procedures Reduction of dislocation  Date/Time: 02/02/2022 12:14 AM  Performed by: Marcello Fennel, PA-C Authorized by: Marcello Fennel, PA-C  Consent: Verbal consent obtained. Written consent obtained. Risks and benefits: risks, benefits and alternatives were discussed Consent given by: patient Patient identity confirmed: verbally with patient and arm band Time out: Immediately prior to procedure a "time out" was called to verify the correct patient, procedure, equipment, support staff and site/side marked as required. Preparation: Patient was prepped and draped in the usual sterile fashion. Local anesthesia used: no  Anesthesia: Local anesthesia used: no  Sedation: Patient sedated: yes Sedation type: moderate (conscious) sedation Sedatives: propofol Analgesia: fentanyl  Patient tolerance: patient tolerated the procedure well with no immediate complications   .Splint Application  Date/Time: 02/02/2022 12:15 AM  Performed by: Marcello Fennel, PA-C Authorized by: Marcello Fennel, PA-C   Consent:    Consent obtained:  Verbal   Consent given by:  Patient   Risks, benefits, and alternatives were discussed: yes     Risks discussed:  Discoloration, numbness, pain and swelling    Alternatives discussed:  No treatment, delayed treatment, alternative treatment, observation and referral Universal protocol:    Patient identity confirmed:  Verbally with patient Pre-procedure details:    Distal neurologic exam:  Normal   Distal perfusion: distal pulses strong   Procedure details:    Location:  Leg   Leg location:  L lower leg   Strapping: no     Splint type:  Knee immobilizer Post-procedure details:    Distal neurologic exam:  Normal   Distal perfusion: distal pulses strong     Procedure completion:  Tolerated well, no immediate complications   Post-procedure imaging: reviewed       Medications Ordered in ED Medications  fentaNYL (SUBLIMAZE) injection 50 mcg (50 mcg Intravenous Given 02/01/22 2232)  sodium chloride 0.9 % bolus 1,000 mL (0 mLs Intravenous Stopped 02/02/22 0128)  fentaNYL (SUBLIMAZE) injection 100 mcg (100 mcg Intravenous Given 02/01/22 2337)  propofol (DIPRIVAN) 10 mg/mL bolus/IV push 48.3 mg (48.3 mg Intravenous Given 02/02/22 0039)  propofol (DIPRIVAN) 10 mg/mL bolus/IV push (25 mg Intravenous Given 02/01/22 2343)  HYDROmorphone (DILAUDID) injection 0.5 mg (0.5 mg Intravenous Given 02/02/22 0100)    ED Course/ Medical Decision Making/ A&P                           Medical Decision Making Amount and/or Complexity of Data Reviewed Labs: ordered. Radiology: ordered.  Risk Prescription drug management.   This patient presents to the ED for concern of left hip pain, this involves an extensive number of treatment options, and is a complaint that carries with it a high risk of complications and morbidity.  The differential diagnosis includes for fracture, dislocation, compartment syndrome    Additional history obtained:  Additional history obtained from partner at bedside External records from outside source obtained and reviewed including recent hospital discharge   Co morbidities that complicate the patient evaluation  N/A  Social  Determinants of Health:  N/A    Lab Tests:  I Ordered, and personally interpreted labs.  The pertinent results include: CBC shows slight leukocytosis of 11.4, hemoglobin stable at 12.6   Imaging Studies ordered:  I ordered imaging studies including portable pelvis, I independently visualized and interpreted  imaging which showed shows posterior dislocation of the left hip I agree with the radiologist interpretation   Cardiac Monitoring:  The patient was maintained on a cardiac monitor.  I personally viewed and interpreted the cardiac monitored which showed an underlying rhythm of: Without signs of ischemia   Medicines ordered and prescription drug management:  I ordered medication including fentanyl, propofol I have reviewed the patients home medicines and have made adjustments as needed  Critical Interventions:  Reduction of the dislocated hip   Reevaluation:  Presents with left-sided hip pain, patient was exquisitely tender during my examination, I am concerned for possible dislocation, will obtain portable for further evaluation  Confirms that he has a posterior hip dislocation, recommend reduction for improved healing, he is going this plan, with Dr. Fredirick Maudlin, respiratory therapy, we were able to reduce the hip without difficulty.  Repeat portable hip shows successful reduction without evidence of fracture.  Sensation tact light touch, 2-second capillary refill, 2+ dorsal pedal pulses.  Patient is placed in a knee immobilizer.  Reassessed after knee immobilizer was placed, neurovascular intact, he is tolerating p.o.  I was notified by nursing staff that patient like to leave, spoke with the patient states that he cannot wait any longer for CT scan, states that he needs to go see his children, he understands that I cannot rule out possible occult fracture this could delay his care when seen at orthopedics he understands this, risk and benefits were discussed and he still would  like to be discharged.  Consultations Obtained:  I requested consultation with the orthopedic doctor Dumonski,  and discussed lab and imaging findings as well as pertinent plan - they recommend: Please knee immobilizer, toe-touch as tolerated recommend CT for rule out of possible acetabulum fracture treatment will remain unchanged fractured and follow-up next week for reassessment    Test Considered:  N/A    Rule out I have low suspicion for septic arthritis as patient denies IV drug use, skin exam was performed no erythematous, edematous, warm joints noted on exam, no new heart murmur heard on exam.  Low suspicion for compartment syndrome as area was palpated it was soft to the touch, neurovascular fully intact.     Dispostion and problem list  After consideration of the diagnostic results and the patients response to treatment, I feel that the patent would benefit from discharge.   Hip dislocation-status full hip reduction, placed in knee immobilizer, will have him follow-up with orthopedics for reassessment.          Final Clinical Impression(s) / ED Diagnoses Final diagnoses:  Dislocation of left hip, initial encounter [facility])    Rx / DC Orders ED Discharge Orders          Ordered    oxyCODONE-acetaminophen (PERCOCET/ROXICET) 5-325 MG tablet  Every 8 hours PRN        02/02/22 0129    ondansetron (ZOFRAN) 4 MG tablet  Every 6 hours        02/02/22 0129              Carroll Sage, PA-C 02/02/22 0131    Tilden Fossa, MD 02/02/22 [phone]

## 2022-02-14 ENCOUNTER — Other Ambulatory Visit: Payer: Self-pay | Admitting: Orthopedic Surgery

## 2022-02-14 DIAGNOSIS — M25552 Pain in left hip: Secondary | ICD-10-CM

## 2022-02-21 ENCOUNTER — Ambulatory Visit
Admission: RE | Admit: 2022-02-21 | Discharge: 2022-02-21 | Disposition: A | Discharge status: Home / Self Care | Payer: Medicaid Other | Source: Ambulatory Visit | Attending: Orthopedic Surgery | Admitting: Orthopedic Surgery

## 2022-02-21 DIAGNOSIS — M25552 Pain in left hip: Secondary | ICD-10-CM

## 2022-03-13 ENCOUNTER — Ambulatory Visit (INDEPENDENT_AMBULATORY_CARE_PROVIDER_SITE_OTHER): Payer: Medicaid Other | Admitting: Orthopaedic Surgery

## 2022-03-13 ENCOUNTER — Telehealth: Payer: Self-pay | Admitting: Orthopaedic Surgery

## 2022-03-13 ENCOUNTER — Ambulatory Visit (HOSPITAL_BASED_OUTPATIENT_CLINIC_OR_DEPARTMENT_OTHER): Payer: Self-pay | Admitting: Orthopaedic Surgery

## 2022-03-13 ENCOUNTER — Other Ambulatory Visit (HOSPITAL_BASED_OUTPATIENT_CLINIC_OR_DEPARTMENT_OTHER): Payer: Self-pay

## 2022-03-13 DIAGNOSIS — M24052 Loose body in left hip: Secondary | ICD-10-CM | POA: Diagnosis not present

## 2022-03-13 MED ORDER — ACETAMINOPHEN 500 MG PO TABS
500.0000 mg | ORAL_TABLET | Freq: Three times a day (TID) | ORAL | 0 refills | Status: AC
  Filled 2022-03-13: qty 30, 10d supply, fill #0

## 2022-03-13 MED ORDER — OXYCODONE HCL 5 MG PO TABS
5.0000 mg | ORAL_TABLET | ORAL | 0 refills | Status: AC | PRN
  Filled 2022-03-13: qty 20, 4d supply, fill #0

## 2022-03-13 MED ORDER — IBUPROFEN 800 MG PO TABS
800.0000 mg | ORAL_TABLET | Freq: Three times a day (TID) | ORAL | 0 refills | Status: AC
  Filled 2022-03-13: qty 30, 10d supply, fill #0

## 2022-03-13 MED ORDER — ASPIRIN 325 MG PO TBEC
325.0000 mg | DELAYED_RELEASE_TABLET | Freq: Every day | ORAL | 0 refills | Status: AC
  Filled 2022-03-13: qty 30, 30d supply, fill #0

## 2022-03-13 NOTE — Telephone Encounter (Signed)
Pt's wife Saddie Benders called requesting a letter for today's visit for her husband and she states she also need on for her job to say she was present with him at his visit. Please call Saddie Benders about this matter at [phone]. Please send to pt's mychart.

## 2022-03-13 NOTE — Progress Notes (Signed)
Chief Complaint: Left hip pain     History of Present Illness:    Erik Griffith is a 37 y.o. male presents today with left hip and groin pain which has been going on since Christmas eve.  This happened approximately 6 weeks ago where he had a hip dislocation which was subsequently reduced in the emergency room.  CT scan was obtained with [facility] orthopedics outpatient which did show a retained fragment in the joint.  There is a small posterior wall acetabular fracture as well.  He is here today for further assessment.  He has been using a crutch on the left side.  He is very active and has 3 young children at home.  He works on his feet the majority of the day but he has been out of work since this occurred.  He is otherwise healthy and does not have any history of hip pain.  He is here today as a referral from Dr. Mardelle Matte.    Surgical History:   None  PMH/PSH/Family History/Social History/Meds/Allergies:   No past medical history on file. Past Surgical History:  Procedure Laterality Date   APPENDECTOMY     FLEXOR TENDON REPAIR Left 03/07/2019   Procedure: LEFT THUMB FLEXOR TENDON REPAIR;  Surgeon: Milly Jakob, MD;  Location: [facility];  Service: Orthopedics;  Laterality: Left;   IR FLUORO GUIDE CV LINE RIGHT  02/07/2021   IR US GUIDE VASC ACCESS RIGHT  02/07/2021   NERVE REPAIR Left 03/07/2019   Procedure: LEFT HAND NERVE REPAIR(S);  Surgeon: Milly Jakob, MD;  Location: [facility];  Service: Orthopedics;  Laterality: Left;   Social History   Socioeconomic History   Marital status: Married    Spouse name: Not on file   Number of children: Not on file   Years of education: Not on file   Highest education level: Not on file  Occupational History   Not on file  Tobacco Use   Smoking status: Every Day    Packs/day: 0.25    Types: Cigarettes   Smokeless tobacco: Never  Substance and Sexual Activity   Alcohol  use: Yes    Comment: occ   Drug use: Yes    Types: Marijuana   Sexual activity: Not on file  Other Topics Concern   Not on file  Social History Narrative   Not on file   Social Determinants of Health   Financial Resource Strain: Not on file  Food Insecurity: Not on file  Transportation Needs: Not on file  Physical Activity: Not on file  Stress: Not on file  Social Connections: Not on file   No family history on file. No Known Allergies Current Outpatient Medications  Medication Sig Dispense Refill   acetaminophen (TYLENOL) 500 MG tablet Take 1 tablet (500 mg total) by mouth every 8 (eight) hours for 10 days. 30 tablet 0   aspirin EC 325 MG tablet Take 1 tablet (325 mg total) by mouth daily. 30 tablet 0   ibuprofen (ADVIL) 800 MG tablet Take 1 tablet (800 mg total) by mouth every 8 (eight) hours for 10 days. Please take with food, please alternate with acetaminophen 30 tablet 0   oxyCODONE (OXY IR/ROXICODONE) 5 MG immediate release tablet Take 1 tablet (5 mg total) by mouth every 4 (four) hours as  needed (severe pain). 20 tablet 0   acetaminophen (TYLENOL) 325 MG tablet Take 2 tablets (650 mg total) by mouth every 6 (six) hours. (Patient not taking: Reported on 02/02/2022)     amLODipine (NORVASC) 10 MG tablet Take 1 tablet (10 mg total) by mouth daily. (Patient not taking: Reported on 02/02/2022) 30 tablet 2   carvedilol (COREG) 25 MG tablet Take 1 tablet (25 mg total) by mouth 2 (two) times daily with a meal. (Patient not taking: Reported on 02/02/2022) 60 tablet 2   cyclobenzaprine (FLEXERIL) 10 MG tablet Take 1 tablet (10 mg total) by mouth 2 (two) times daily as needed for muscle spasms. (Patient not taking: Reported on 02/02/2022) 60 tablet 1   gabapentin (NEURONTIN) 300 MG capsule Take 1 capsule (300 mg total) by mouth 3 (three) times daily as needed. (Patient not taking: Reported on 02/02/2022) 90 capsule 3   hydrALAZINE (APRESOLINE) 100 MG tablet Take 0.5 tablets (50 mg total)  by mouth every 12 (twelve) hours. (Patient not taking: Reported on 02/02/2022) 60 tablet 2   naproxen sodium (ALEVE) 220 MG tablet Take 220 mg by mouth daily as needed (pain).     ondansetron (ZOFRAN) 4 MG tablet Take 1 tablet (4 mg total) by mouth every 6 (six) hours. 12 tablet 0   ondansetron (ZOFRAN) 4 MG tablet Take 1 tablet (4 mg total) by mouth every 6 (six) hours. 12 tablet 0   predniSONE (DELTASONE) 50 MG tablet Take 1 tablet (50 mg total) by mouth daily. (Patient not taking: Reported on 02/02/2022) 5 tablet 0   sevelamer carbonate (RENVELA) 800 MG tablet Take 3 tablets (2,400 mg total) by mouth 3 (three) times daily with meals. (Patient not taking: Reported on 02/02/2022) 60 tablet 0   topiramate (TOPAMAX) 50 MG tablet Take 1 tablet (50 mg total) by mouth daily. (Patient not taking: Reported on 02/02/2022) 90 tablet 3   No current facility-administered medications for this visit.   No results found.  Review of Systems:   A ROS was performed including pertinent positives and negatives as documented in the HPI.  Physical Exam :   Constitutional: NAD and appears stated age Neurological: Alert and oriented Psych: Appropriate affect and cooperative There were no vitals taken for this visit.   Comprehensive Musculoskeletal Exam:    Inspection Right Left  Skin No atrophy or gross abnormalities appreciated No atrophy or gross abnormalities appreciated  Palpation    Tenderness None Femoral acetabular  Crepitus None None  Range of Motion    Flexion (passive) 120 120  Extension 30 30  IR 30 30 with pain  ER 50 50 with pain  Strength    Flexion  5/5 5/5  Extension 5/5 5/5  Special Tests    FABER Negative Negative  FADIR Negative Positive  ER Lag/Capsular Insufficiency Negative Negative  Instability Negative Negative  Sacroiliac pain Negative  Negative   Instability    Generalized Laxity No No  Neurologic    sciatic, femoral, obturator nerves intact to light sensation   Vascular/Lymphatic    DP pulse 2+ 2+  Lumbar Exam    Patient has symmetric lumbar range of motion with negative pain referral to hip     Imaging:   Xray (3 views left hip): Status post closed reduction with intra-articular fragment  CT left hip: There is a small posterior wall acetabular fracture as well as an intra-articular loose body  I personally reviewed and interpreted the radiographs.   Assessment:   36 y.o.   male with a left intra-articular loose body after hip dislocation which was subsequently reduced in the emergency room.  At today's visit I did describe that I would recommend urgent hip arthroscopy to remove this fragment.  I have asked that he remain nonweightbearing on this left hip until that can be performed.  I did discuss the role of possible MRI although at this point I do not believe that an MRI should delay his care.  I did describe that given the fact that he did has had a hip dislocation that I would recommend labral repair as this is likely disrupted.  We will plan to proceed with surgery urgently given the fact that he does have an intra-articular loose body.  Given the fact that it has been a month we did also talk about long-term outcomes and possibility of the need for future chondral procedures.  Plan :    -Plan for left hip arthroscopy with removal of loose body and labral repair    After a lengthy discussion of treatment options, including risks, benefits, alternatives, complications of surgical and nonsurgical conservative options, the patient elected surgical repair.   The patient  is aware of the material risks  and complications including, but not limited to injury to adjacent structures, neurovascular injury, infection, numbness, bleeding, implant failure, thermal burns, stiffness, persistent pain, failure to heal, disease transmission from allograft, need for further surgery, dislocation, anesthetic risks, blood clots, risks of death,and others. The  probabilities of surgical success and failure discussed with patient given their particular co-morbidities.The time and nature of expected rehabilitation and recovery was discussed.The patient's questions were all answered preoperatively.  No barriers to understanding were noted. I explained the natural history of the disease process and Rx rationale.  I explained to the patient what I considered to be reasonable expectations given their personal situation.  The final treatment plan was arrived at through a shared patient decision making process model.      I personally saw and evaluated the patient, and participated in the management and treatment plan.  Jdyn Parkerson, MD Attending Physician, Orthopedic Surgery  This document was dictated using Dragon voice recognition software. A reasonable attempt at proof reading has been made to minimize errors. 

## 2022-03-13 NOTE — H&P (View-Only) (Signed)
Chief Complaint: Left hip pain     History of Present Illness:    Erik Griffith is a 37 y.o. male presents today with left hip and groin pain which has been going on since Christmas eve.  This happened approximately 6 weeks ago where he had a hip dislocation which was subsequently reduced in the emergency room.  CT scan was obtained with [facility] orthopedics outpatient which did show a retained fragment in the joint.  There is a small posterior wall acetabular fracture as well.  He is here today for further assessment.  He has been using a crutch on the left side.  He is very active and has 3 young children at home.  He works on his feet the majority of the day but he has been out of work since this occurred.  He is otherwise healthy and does not have any history of hip pain.  He is here today as a referral from Dr. Mardelle Matte.    Surgical History:   None  PMH/PSH/Family History/Social History/Meds/Allergies:   No past medical history on file. Past Surgical History:  Procedure Laterality Date   APPENDECTOMY     FLEXOR TENDON REPAIR Left 03/07/2019   Procedure: LEFT THUMB FLEXOR TENDON REPAIR;  Surgeon: Milly Jakob, MD;  Location: [place];  Service: Orthopedics;  Laterality: Left;   IR FLUORO GUIDE CV LINE RIGHT  02/07/2021   IR US GUIDE VASC ACCESS RIGHT  02/07/2021   NERVE REPAIR Left 03/07/2019   Procedure: LEFT HAND NERVE REPAIR(S);  Surgeon: Milly Jakob, MD;  Location: [place];  Service: Orthopedics;  Laterality: Left;   Social History   Socioeconomic History   Marital status: Married    Spouse name: Not on file   Number of children: Not on file   Years of education: Not on file   Highest education level: Not on file  Occupational History   Not on file  Tobacco Use   Smoking status: Every Day    Packs/day: 0.25    Types: Cigarettes   Smokeless tobacco: Never  Substance and Sexual Activity   Alcohol  use: Yes    Comment: occ   Drug use: Yes    Types: Marijuana   Sexual activity: Not on file  Other Topics Concern   Not on file  Social History Narrative   Not on file   Social Determinants of Health   Financial Resource Strain: Not on file  Food Insecurity: Not on file  Transportation Needs: Not on file  Physical Activity: Not on file  Stress: Not on file  Social Connections: Not on file   No family history on file. No Known Allergies Current Outpatient Medications  Medication Sig Dispense Refill   acetaminophen (TYLENOL) 500 MG tablet Take 1 tablet (500 mg total) by mouth every 8 (eight) hours for 10 days. 30 tablet 0   aspirin EC 325 MG tablet Take 1 tablet (325 mg total) by mouth daily. 30 tablet 0   ibuprofen (ADVIL) 800 MG tablet Take 1 tablet (800 mg total) by mouth every 8 (eight) hours for 10 days. Please take with food, please alternate with acetaminophen 30 tablet 0   oxyCODONE (OXY IR/ROXICODONE) 5 MG immediate release tablet Take 1 tablet (5 mg total) by mouth every 4 (four) hours as  needed (severe pain). 20 tablet 0   acetaminophen (TYLENOL) 325 MG tablet Take 2 tablets (650 mg total) by mouth every 6 (six) hours. (Patient not taking: Reported on 02/02/2022)     amLODipine (NORVASC) 10 MG tablet Take 1 tablet (10 mg total) by mouth daily. (Patient not taking: Reported on 02/02/2022) 30 tablet 2   carvedilol (COREG) 25 MG tablet Take 1 tablet (25 mg total) by mouth 2 (two) times daily with a meal. (Patient not taking: Reported on 02/02/2022) 60 tablet 2   cyclobenzaprine (FLEXERIL) 10 MG tablet Take 1 tablet (10 mg total) by mouth 2 (two) times daily as needed for muscle spasms. (Patient not taking: Reported on 02/02/2022) 60 tablet 1   gabapentin (NEURONTIN) 300 MG capsule Take 1 capsule (300 mg total) by mouth 3 (three) times daily as needed. (Patient not taking: Reported on 02/02/2022) 90 capsule 3   hydrALAZINE (APRESOLINE) 100 MG tablet Take 0.5 tablets (50 mg total)  by mouth every 12 (twelve) hours. (Patient not taking: Reported on 02/02/2022) 60 tablet 2   naproxen sodium (ALEVE) 220 MG tablet Take 220 mg by mouth daily as needed (pain).     ondansetron (ZOFRAN) 4 MG tablet Take 1 tablet (4 mg total) by mouth every 6 (six) hours. 12 tablet 0   ondansetron (ZOFRAN) 4 MG tablet Take 1 tablet (4 mg total) by mouth every 6 (six) hours. 12 tablet 0   predniSONE (DELTASONE) 50 MG tablet Take 1 tablet (50 mg total) by mouth daily. (Patient not taking: Reported on 02/02/2022) 5 tablet 0   sevelamer carbonate (RENVELA) 800 MG tablet Take 3 tablets (2,400 mg total) by mouth 3 (three) times daily with meals. (Patient not taking: Reported on 02/02/2022) 60 tablet 0   topiramate (TOPAMAX) 50 MG tablet Take 1 tablet (50 mg total) by mouth daily. (Patient not taking: Reported on 02/02/2022) 90 tablet 3   No current facility-administered medications for this visit.   No results found.  Review of Systems:   A ROS was performed including pertinent positives and negatives as documented in the HPI.  Physical Exam :   Constitutional: NAD and appears stated age Neurological: Alert and oriented Psych: Appropriate affect and cooperative There were no vitals taken for this visit.   Comprehensive Musculoskeletal Exam:    Inspection Right Left  Skin No atrophy or gross abnormalities appreciated No atrophy or gross abnormalities appreciated  Palpation    Tenderness None Femoral acetabular  Crepitus None None  Range of Motion    Flexion (passive) 120 120  Extension 30 30  IR 30 30 with pain  ER 50 50 with pain  Strength    Flexion  5/5 5/5  Extension 5/5 5/5  Special Tests    FABER Negative Negative  FADIR Negative Positive  ER Lag/Capsular Insufficiency Negative Negative  Instability Negative Negative  Sacroiliac pain Negative  Negative   Instability    Generalized Laxity No No  Neurologic    sciatic, femoral, obturator nerves intact to light sensation   Vascular/Lymphatic    DP pulse 2+ 2+  Lumbar Exam    Patient has symmetric lumbar range of motion with negative pain referral to hip     Imaging:   Xray (3 views left hip): Status post closed reduction with intra-articular fragment  CT left hip: There is a small posterior wall acetabular fracture as well as an intra-articular loose body  I personally reviewed and interpreted the radiographs.   Assessment:   37 y.o.  male with a left intra-articular loose body after hip dislocation which was subsequently reduced in the emergency room.  At today's visit I did describe that I would recommend urgent hip arthroscopy to remove this fragment.  I have asked that he remain nonweightbearing on this left hip until that can be performed.  I did discuss the role of possible MRI although at this point I do not believe that an MRI should delay his care.  I did describe that given the fact that he did has had a hip dislocation that I would recommend labral repair as this is likely disrupted.  We will plan to proceed with surgery urgently given the fact that he does have an intra-articular loose body.  Given the fact that it has been a month we did also talk about long-term outcomes and possibility of the need for future chondral procedures.  Plan :    -Plan for left hip arthroscopy with removal of loose body and labral repair    After a lengthy discussion of treatment options, including risks, benefits, alternatives, complications of surgical and nonsurgical conservative options, the patient elected surgical repair.   The patient  is aware of the material risks  and complications including, but not limited to injury to adjacent structures, neurovascular injury, infection, numbness, bleeding, implant failure, thermal burns, stiffness, persistent pain, failure to heal, disease transmission from allograft, need for further surgery, dislocation, anesthetic risks, blood clots, risks of death,and others. The  probabilities of surgical success and failure discussed with patient given their particular co-morbidities.The time and nature of expected rehabilitation and recovery was discussed.The patient's questions were all answered preoperatively.  No barriers to understanding were noted. I explained the natural history of the disease process and Rx rationale.  I explained to the patient what I considered to be reasonable expectations given their personal situation.  The final treatment plan was arrived at through a shared patient decision making process model.      I personally saw and evaluated the patient, and participated in the management and treatment plan.  Vanetta Mulders, MD Attending Physician, Orthopedic Surgery  This document was dictated using Dragon voice recognition software. A reasonable attempt at proof reading has been made to minimize errors.

## 2022-03-13 NOTE — Telephone Encounter (Signed)
Note placed to mychart and called patients wife to let her know. No further questions asked

## 2022-03-17 ENCOUNTER — Other Ambulatory Visit: Payer: Self-pay

## 2022-03-17 ENCOUNTER — Ambulatory Visit (HOSPITAL_BASED_OUTPATIENT_CLINIC_OR_DEPARTMENT_OTHER): Payer: Self-pay | Admitting: Orthopaedic Surgery

## 2022-03-17 ENCOUNTER — Encounter (HOSPITAL_COMMUNITY): Payer: Self-pay | Admitting: Orthopaedic Surgery

## 2022-03-17 ENCOUNTER — Other Ambulatory Visit (HOSPITAL_BASED_OUTPATIENT_CLINIC_OR_DEPARTMENT_OTHER): Payer: Self-pay | Admitting: Orthopaedic Surgery

## 2022-03-17 NOTE — Progress Notes (Signed)
PCP - Denies  Anesthesia review: N  Patient verbally denies any shortness of breath, fever, cough and chest pain during phone call   -------------  SDW INSTRUCTIONS given:  Your procedure is scheduled on 03/18/22.  Report to [facility] Main Entrance "A" at 1030 A.M., and check in at the Admitting office.  Call this number if you have problems the morning of surgery:  [phone]   Remember:  Do not eat after midnight the night before your surgery  You may drink clear liquids until 1000 the morning of your surgery.   Clear liquids allowed are: Water, Non-Citrus Juices (without pulp), Carbonated Beverages, Clear Tea, Black Coffee Only, and Gatorade    Take these medicines the morning of surgery with A SIP OF WATER  N/A  As of today, STOP taking any Aspirin (unless otherwise instructed by your surgeon) Aleve, Naproxen, Ibuprofen, Motrin, Advil, Goody's, BC's, all herbal medications, fish oil, and all vitamins.                      Do not wear jewelry, make up, or nail polish            Do not wear lotions, powders, perfumes/colognes, or deodorant.            Do not shave 48 hours prior to surgery.  Men may shave face and neck.            Do not bring valuables to the hospital.            [facility] is not responsible for any belongings or valuables.  Do NOT Smoke (Tobacco/Vaping) 24 hours prior to your procedure If you use a CPAP at night, you may bring all equipment for your overnight stay.   Contacts, glasses, dentures or bridgework may not be worn into surgery.      For patients admitted to the hospital, discharge time will be determined by your treatment team.   Patients discharged the day of surgery will not be allowed to drive home, and someone needs to stay with them for 24 hours.    Special instructions:   - Preparing For Surgery  Before surgery, you can play an important role. Because skin is not sterile, your skin needs to be as free of germs as  possible. You can reduce the number of germs on your skin by washing with CHG (chlorahexidine gluconate) Soap before surgery.  CHG is an antiseptic cleaner which kills germs and bonds with the skin to continue killing germs even after washing.    Oral Hygiene is also important to reduce your risk of infection.  Remember - BRUSH YOUR TEETH THE MORNING OF SURGERY WITH YOUR REGULAR TOOTHPASTE  Please do not use if you have an allergy to CHG or antibacterial soaps. If your skin becomes reddened/irritated stop using the CHG.  Do not shave (including legs and underarms) for at least 48 hours prior to first CHG shower. It is OK to shave your face.  Please follow these instructions carefully.   Shower the NIGHT BEFORE SURGERY and the MORNING OF SURGERY with DIAL Soap.   Pat yourself dry with a CLEAN TOWEL.  Wear CLEAN PAJAMAS to bed the night before surgery  Place CLEAN SHEETS on your bed the night of your first shower and DO NOT SLEEP WITH PETS.   Day of Surgery: Please shower morning of surgery  Wear Clean/Comfortable clothing the morning of surgery Do not apply any deodorants/lotions.  Remember to brush your teeth WITH YOUR REGULAR TOOTHPASTE.   Questions were answered. Patient verbalized understanding of instructions.

## 2022-03-18 ENCOUNTER — Ambulatory Visit (HOSPITAL_COMMUNITY): Payer: Medicaid Other | Admitting: Anesthesiology

## 2022-03-18 ENCOUNTER — Ambulatory Visit (HOSPITAL_BASED_OUTPATIENT_CLINIC_OR_DEPARTMENT_OTHER): Payer: Medicaid Other | Admitting: Anesthesiology

## 2022-03-18 ENCOUNTER — Ambulatory Visit (HOSPITAL_COMMUNITY)
Admission: RE | Admit: 2022-03-18 | Discharge: 2022-03-18 | Disposition: A | Discharge status: Home / Self Care | Payer: Medicaid Other | Attending: Orthopaedic Surgery | Admitting: Orthopaedic Surgery

## 2022-03-18 ENCOUNTER — Encounter (HOSPITAL_COMMUNITY)
Admission: RE | Disposition: A | Discharge status: Home / Self Care | Payer: Self-pay | Source: Home / Self Care | Attending: Orthopaedic Surgery

## 2022-03-18 ENCOUNTER — Encounter (HOSPITAL_COMMUNITY): Payer: Self-pay | Admitting: Orthopaedic Surgery

## 2022-03-18 ENCOUNTER — Other Ambulatory Visit: Payer: Self-pay

## 2022-03-18 ENCOUNTER — Ambulatory Visit (HOSPITAL_BASED_OUTPATIENT_CLINIC_OR_DEPARTMENT_OTHER): Payer: Self-pay | Admitting: Orthopaedic Surgery

## 2022-03-18 ENCOUNTER — Ambulatory Visit (HOSPITAL_COMMUNITY): Payer: Medicaid Other

## 2022-03-18 DIAGNOSIS — S73192A Other sprain of left hip, initial encounter: Secondary | ICD-10-CM | POA: Diagnosis not present

## 2022-03-18 DIAGNOSIS — M24052 Loose body in left hip: Secondary | ICD-10-CM

## 2022-03-18 DIAGNOSIS — Z79899 Other long term (current) drug therapy: Secondary | ICD-10-CM | POA: Diagnosis not present

## 2022-03-18 DIAGNOSIS — S73192D Other sprain of left hip, subsequent encounter: Secondary | ICD-10-CM

## 2022-03-18 DIAGNOSIS — F17210 Nicotine dependence, cigarettes, uncomplicated: Secondary | ICD-10-CM | POA: Insufficient documentation

## 2022-03-18 DIAGNOSIS — X58XXXA Exposure to other specified factors, initial encounter: Secondary | ICD-10-CM | POA: Insufficient documentation

## 2022-03-18 HISTORY — PX: HIP ARTHROSCOPY: SHX668

## 2022-03-18 LAB — CBC
HCT: 38 % — ABNORMAL LOW (ref 39.0–52.0)
Hemoglobin: 13 g/dL (ref 13.0–17.0)
MCH: 30.1 pg (ref 26.0–34.0)
MCHC: 34.2 g/dL (ref 30.0–36.0)
MCV: 88 fL (ref 80.0–100.0)
Platelets: 300 10*3/uL (ref 150–400)
RBC: 4.32 MIL/uL (ref 4.22–5.81)
RDW: 12.7 % (ref 11.5–15.5)
WBC: 4.5 10*3/uL (ref 4.0–10.5)
nRBC: 0 % (ref 0.0–0.2)

## 2022-03-18 LAB — NO BLOOD PRODUCTS

## 2022-03-18 SURGERY — ARTHROSCOPY HIP
Anesthesia: General | Site: Hip | Laterality: Left

## 2022-03-18 MED ORDER — KETOROLAC TROMETHAMINE 30 MG/ML IJ SOLN
INTRAMUSCULAR | Status: AC
  Filled 2022-03-18: qty 1

## 2022-03-18 MED ORDER — EPINEPHRINE PF 1 MG/ML IJ SOLN
INTRAMUSCULAR | Status: AC
  Filled 2022-03-18: qty 3

## 2022-03-18 MED ORDER — BUPIVACAINE HCL (PF) 0.25 % IJ SOLN
INTRAMUSCULAR | Status: AC
  Filled 2022-03-18: qty 30

## 2022-03-18 MED ORDER — FENTANYL CITRATE (PF) 100 MCG/2ML IJ SOLN
INTRAMUSCULAR | Status: AC
  Filled 2022-03-18: qty 2

## 2022-03-18 MED ORDER — FENTANYL CITRATE (PF) 250 MCG/5ML IJ SOLN
INTRAMUSCULAR | Status: AC
  Filled 2022-03-18: qty 5

## 2022-03-18 MED ORDER — SODIUM CHLORIDE 0.9 % IR SOLN
Status: DC | PRN
  Administered 2022-03-18 (×4): 3000 mL

## 2022-03-18 MED ORDER — MIDAZOLAM HCL 2 MG/2ML IJ SOLN
INTRAMUSCULAR | Status: DC | PRN
  Administered 2022-03-18: 2 mg via INTRAVENOUS

## 2022-03-18 MED ORDER — BUPIVACAINE HCL 0.25 % IJ SOLN
INTRAMUSCULAR | Status: DC | PRN
  Administered 2022-03-18: 30 mL

## 2022-03-18 MED ORDER — CEFAZOLIN SODIUM-DEXTROSE 2-4 GM/100ML-% IV SOLN
2.0000 g | INTRAVENOUS | Status: AC
  Administered 2022-03-18: 2 g via INTRAVENOUS
  Filled 2022-03-18: qty 100

## 2022-03-18 MED ORDER — ONDANSETRON HCL 4 MG/2ML IJ SOLN
INTRAMUSCULAR | Status: AC
  Filled 2022-03-18: qty 2

## 2022-03-18 MED ORDER — OXYCODONE HCL 5 MG PO TABS
5.0000 mg | ORAL_TABLET | Freq: Once | ORAL | Status: AC
  Administered 2022-03-18: 5 mg via ORAL

## 2022-03-18 MED ORDER — OXYCODONE HCL 5 MG PO TABS
ORAL_TABLET | ORAL | Status: AC
  Filled 2022-03-18: qty 1

## 2022-03-18 MED ORDER — ONDANSETRON HCL 4 MG/2ML IJ SOLN
INTRAMUSCULAR | Status: DC | PRN
  Administered 2022-03-18: 4 mg via INTRAVENOUS

## 2022-03-18 MED ORDER — KETOROLAC TROMETHAMINE 30 MG/ML IJ SOLN
INTRAMUSCULAR | Status: DC | PRN
  Administered 2022-03-18: 30 mg via INTRAVENOUS

## 2022-03-18 MED ORDER — GABAPENTIN 300 MG PO CAPS
300.0000 mg | ORAL_CAPSULE | Freq: Once | ORAL | Status: AC
  Administered 2022-03-18: 300 mg via ORAL
  Filled 2022-03-18: qty 1

## 2022-03-18 MED ORDER — AMISULPRIDE (ANTIEMETIC) 5 MG/2ML IV SOLN: 10.0000 mg | Freq: Once | INTRAVENOUS | Status: DC | PRN

## 2022-03-18 MED ORDER — TRANEXAMIC ACID-NACL 1000-0.7 MG/100ML-% IV SOLN
1000.0000 mg | INTRAVENOUS | Status: AC
  Administered 2022-03-18: 1000 mg via INTRAVENOUS
  Filled 2022-03-18: qty 100

## 2022-03-18 MED ORDER — DEXAMETHASONE SODIUM PHOSPHATE 10 MG/ML IJ SOLN
INTRAMUSCULAR | Status: DC | PRN
  Administered 2022-03-18: 10 mg via INTRAVENOUS

## 2022-03-18 MED ORDER — CHLORHEXIDINE GLUCONATE 0.12 % MT SOLN: 15.0000 mL | Freq: Once | OROMUCOSAL | Status: AC

## 2022-03-18 MED ORDER — FENTANYL CITRATE (PF) 100 MCG/2ML IJ SOLN
25.0000 ug | INTRAMUSCULAR | Status: DC | PRN
  Administered 2022-03-18 (×2): 50 ug via INTRAVENOUS

## 2022-03-18 MED ORDER — EPINEPHRINE 1 MG/10ML IJ SOSY
PREFILLED_SYRINGE | INTRAMUSCULAR | Status: DC | PRN
  Administered 2022-03-18: 2 mg

## 2022-03-18 MED ORDER — PROPOFOL 10 MG/ML IV BOLUS
INTRAVENOUS | Status: DC | PRN
  Administered 2022-03-18: 200 mg via INTRAVENOUS

## 2022-03-18 MED ORDER — ORAL CARE MOUTH RINSE: 15.0000 mL | Freq: Once | OROMUCOSAL | Status: AC

## 2022-03-18 MED ORDER — LIDOCAINE 2% (20 MG/ML) 5 ML SYRINGE
INTRAMUSCULAR | Status: AC
  Filled 2022-03-18: qty 5

## 2022-03-18 MED ORDER — LACTATED RINGERS IV SOLN: INTRAVENOUS | Status: DC

## 2022-03-18 MED ORDER — ACETAMINOPHEN 500 MG PO TABS
1000.0000 mg | ORAL_TABLET | Freq: Once | ORAL | Status: AC
  Administered 2022-03-18: 1000 mg via ORAL
  Filled 2022-03-18: qty 2

## 2022-03-18 MED ORDER — DEXAMETHASONE SODIUM PHOSPHATE 10 MG/ML IJ SOLN
INTRAMUSCULAR | Status: AC
  Filled 2022-03-18: qty 1

## 2022-03-18 MED ORDER — FENTANYL CITRATE (PF) 250 MCG/5ML IJ SOLN
INTRAMUSCULAR | Status: DC | PRN
  Administered 2022-03-18: 50 ug via INTRAVENOUS
  Administered 2022-03-18: 200 ug via INTRAVENOUS

## 2022-03-18 MED ORDER — PROPOFOL 10 MG/ML IV BOLUS
INTRAVENOUS | Status: AC
  Filled 2022-03-18: qty 20

## 2022-03-18 MED ORDER — MIDAZOLAM HCL 2 MG/2ML IJ SOLN
INTRAMUSCULAR | Status: AC
  Filled 2022-03-18: qty 2

## 2022-03-18 MED ORDER — PROMETHAZINE HCL 25 MG/ML IJ SOLN: 6.2500 mg | INTRAMUSCULAR | Status: DC | PRN

## 2022-03-18 MED ORDER — 0.9 % SODIUM CHLORIDE (POUR BTL) OPTIME
TOPICAL | Status: DC | PRN
  Administered 2022-03-18: 1000 mL

## 2022-03-18 MED ORDER — SODIUM CHLORIDE (PF) 0.9 % IJ SOLN
INTRAMUSCULAR | Status: AC
  Filled 2022-03-18: qty 10

## 2022-03-18 MED ORDER — CHLORHEXIDINE GLUCONATE 0.12 % MT SOLN
OROMUCOSAL | Status: AC
  Administered 2022-03-18: 15 mL via OROMUCOSAL
  Filled 2022-03-18: qty 15

## 2022-03-18 MED ORDER — LIDOCAINE 2% (20 MG/ML) 5 ML SYRINGE
INTRAMUSCULAR | Status: DC | PRN
  Administered 2022-03-18: 100 mg via INTRAVENOUS

## 2022-03-18 SURGICAL SUPPLY — 60 items
ANCHOR SUT 1.4 FLEX (Anchor) IMPLANT
BAG COUNTER SPONGE SURGICOUNT (BAG) IMPLANT
BIT DRILL FLEX NANOTACK (BIT) IMPLANT
BLADE SAMURAI FULL RADIUS CVD (BLADE) IMPLANT
BLADE SURG 11 STRL SS (BLADE) ×1 IMPLANT
BUR ROUND HI FLUTE 8 4X19 (BURR) ×1 IMPLANT
BURR ROUND HI FLUTE 8 4X19 (BURR) ×1
CANNULA OBTURATOR FLOWPORT ST5 (CANNULA) IMPLANT
CHLORAPREP W/TINT 26 (MISCELLANEOUS) ×1 IMPLANT
COOLER ICEMAN CLASSIC (MISCELLANEOUS) IMPLANT
DISSECTOR 4.2MMX19CM HL (MISCELLANEOUS) ×1 IMPLANT
DRAPE C-ARM 42X72 X-RAY (DRAPES) ×1 IMPLANT
DRAPE STERI IOBAN 125X83 (DRAPES) ×1 IMPLANT
DRAPE U-SHAPE 47X51 STRL (DRAPES) ×2 IMPLANT
DRSG TEGADERM 4X4.5 CHG (GAUZE/BANDAGES/DRESSINGS) IMPLANT
DRSG TEGADERM 4X4.75 (GAUZE/BANDAGES/DRESSINGS) ×2 IMPLANT
DRSG XEROFORM 1X8 (GAUZE/BANDAGES/DRESSINGS) IMPLANT
FEE RENTAL EQUIP HIP INSTR KIT (INSTRUMENTS) IMPLANT
GAUZE SPONGE 4X4 12PLY STRL (GAUZE/BANDAGES/DRESSINGS) ×1 IMPLANT
GAUZE SPONGE 4X4 12PLY STRL LF (GAUZE/BANDAGES/DRESSINGS) IMPLANT
GLOVE BIO SURGEON STRL SZ7.5 (GLOVE) ×1 IMPLANT
GLOVE BIOGEL PI IND STRL 6.5 (GLOVE) ×1 IMPLANT
GLOVE BIOGEL PI IND STRL 8 (GLOVE) ×1 IMPLANT
GLOVE ECLIPSE 6.0 STRL STRAW (GLOVE) ×1 IMPLANT
GOWN STRL REUS W/ TWL LRG LVL3 (GOWN DISPOSABLE) ×2 IMPLANT
GOWN STRL REUS W/ TWL XL LVL3 (GOWN DISPOSABLE) ×1 IMPLANT
GOWN STRL REUS W/TWL LRG LVL3 (GOWN DISPOSABLE) ×2
GOWN STRL REUS W/TWL XL LVL3 (GOWN DISPOSABLE) ×1
INSTRUMENT ORTHO TEXT HIP FEM (INSTRUMENTS) IMPLANT
KIT BASIN OR (CUSTOM PROCEDURE TRAY) ×1 IMPLANT
KIT HIP ARTHROSCOPY (ORTHOPEDIC DISPOSABLE SUPPLIES) ×1 IMPLANT
KIT PORTAL ENTRY HIP ACCESS (KITS) IMPLANT
KIT TURNOVER KIT B (KITS) ×1 IMPLANT
MANIFOLD NEPTUNE II (INSTRUMENTS) ×2 IMPLANT
NDL INJECTOR II CARTRIDGE (MISCELLANEOUS) IMPLANT
NDL SPNL 18GX3.5 QUINCKE PK (NEEDLE) ×1 IMPLANT
NEEDLE INJECTOR II CARTRIDGE (MISCELLANEOUS) ×1 IMPLANT
NEEDLE SPNL 18GX3.5 QUINCKE PK (NEEDLE) ×1 IMPLANT
PACK BASIC III (CUSTOM PROCEDURE TRAY) ×1
PACK SRG BSC III STRL LF ECLPS (CUSTOM PROCEDURE TRAY) ×1 IMPLANT
PAD ARMBOARD 7.5X6 YLW CONV (MISCELLANEOUS) ×2 IMPLANT
PAD COLD SHLDR WRAP-ON (PAD) IMPLANT
PASSER SUT 1.5D CRESCENT (INSTRUMENTS) IMPLANT
PASSER SUT 70D UP ANGLED (INSTRUMENTS) IMPLANT
SPIKE FLUID TRANSFER (MISCELLANEOUS) ×1 IMPLANT
SPONGE T-LAP 18X18 ~~LOC~~+RFID (SPONGE) ×1 IMPLANT
SUT ETHILON 3 0 PS 1 (SUTURE) IMPLANT
SUT FIBERWIRE #2 38 T-5 BLUE (SUTURE)
SUT XBRAID 1.4 BLUE (SUTURE) IMPLANT
SUTURE FIBERWR #2 38 T-5 BLUE (SUTURE) IMPLANT
SUTURE TAPE XBRAID 1.2 BLUE 45 (SUTURE) IMPLANT
SUTURETAPE XBRAID 1.2 BLUE 45 (SUTURE) ×1
SYR 50ML LL SCALE MARK (SYRINGE) ×1 IMPLANT
TOWEL GREEN STERILE (TOWEL DISPOSABLE) ×1 IMPLANT
TRAY ARTHROSCOPY HIP STRE FEE (INSTRUMENTS) ×1 IMPLANT
TRAY PIVOT PORT STRE FEE (INSTRUMENTS) ×1 IMPLANT
TUBE CONNECTING 12X1/4 (SUCTIONS) ×2 IMPLANT
TUBING ARTHROSCOPY IRRIG 16FT (MISCELLANEOUS) ×1 IMPLANT
WAND APOLLO RF 50D ABLATOR (BUR) IMPLANT
WATER STERILE IRR 1000ML POUR (IV SOLUTION) ×1 IMPLANT

## 2022-03-18 NOTE — Discharge Instructions (Signed)
     Discharge Instructions    Attending Surgeon: Vanetta Mulders, MD Office Phone Number: [phone]   Diagnosis and Procedures:    Surgeries Performed: Left hip arthroscopy, removal of loose body  Discharge Plan:    Diet: Resume usual diet. Begin with light or bland foods.  Drink plenty of fluids.  Activity:  Touchdown weight bearing left leg. You are advised to go home directly from the hospital or surgical center. Restrict your activities.  GENERAL INSTRUCTIONS: 1.  Please apply ice to your wound to help with swelling and inflammation. This will improve your comfort and your overall recovery following surgery.     2. Please call Dr. Eddie Dibbles office at [phone] with questions Monday-Friday during business hours. If no one answers, please leave a message and someone should get back to the patient within 24 hours. For emergencies please call 911 or proceed to the emergency room.   3. Patient to notify surgical team if experiences any of the following: Bowel/Bladder dysfunction, uncontrolled pain, nerve/muscle weakness, incision with increased drainage or redness, nausea/vomiting and Fever greater than 101.0 F.  Be alert for signs of infection including redness, streaking, odor, fever or chills. Be alert for excessive pain or bleeding and notify your surgeon immediately.  WOUND INSTRUCTIONS:   Leave your dressing, cast, or splint in place until your post operative visit.  Keep it clean and dry.  Always keep the incision clean and dry until the staples/sutures are removed. If there is no drainage from the incision you should keep it open to air. If there is drainage from the incision you must keep it covered at all times until the drainage stops  Do not soak in a bath tub, hot tub, pool, lake or other body of water until 21 days after your surgery and your incision is completely dry and healed.  If you have removable sutures (or staples) they must be removed 10-14 days  (unless otherwise instructed) from the day of your surgery.     1)  Elevate the extremity as much as possible.  2)  Keep the dressing clean and dry.  3)  Please call us if the dressing becomes wet or dirty.  4)  If you are experiencing worsening pain or worsening swelling, please call.     MEDICATIONS: Resume all previous home medications at the previous prescribed dose and frequency unless otherwise noted Start taking the  pain medications on an as-needed basis as prescribed  Please taper down pain medication over the next week following surgery.  Ideally you should not require a refill of any narcotic pain medication.  Take pain medication with food to minimize nausea. In addition to the prescribed pain medication, you may take over-the-counter pain relievers such as Tylenol.  Do NOT take additional tylenol if your pain medication already has tylenol in it.  Aspirin 325mg  daily for four weeks.      FOLLOWUP INSTRUCTIONS: 1. Follow up at the Physical Therapy Clinic 3-4 days following surgery. This appointment should be scheduled unless other arrangements have been made.The Physical Therapy scheduling number is [phone] if an appointment has not already been arranged.  2. Contact Dr. Eddie Dibbles office during office hours at [phone] or the practice after hours line at [phone] for non-emergencies. For medical emergencies call 911.   Discharge Location: Home

## 2022-03-18 NOTE — Anesthesia Postprocedure Evaluation (Signed)
Anesthesia Post Note  Patient: Erik Griffith  Procedure(s) Performed: LEFT HIP ARTHROSCOPY WITH LOOSE BODY REMOVAL AND LABRAL REPAIR (Left: Hip)     Patient location during evaluation: PACU Anesthesia Type: General Level of consciousness: sedated Pain management: pain level controlled Vital Signs Assessment: post-procedure vital signs reviewed and stable Respiratory status: spontaneous breathing and respiratory function stable Cardiovascular status: stable Postop Assessment: no apparent nausea or vomiting Anesthetic complications: no  No notable events documented.  Last Vitals:  Vitals:   03/18/22 1710 03/18/22 1725  BP: (!) 152/73 123/76  Pulse: 74 68  Resp: 13 15  Temp:  36.7 C  SpO2: 100% 96%    Last Pain:  Vitals:   03/18/22 1717  TempSrc:   PainSc: 4                  Dannie Woolen DANIEL

## 2022-03-18 NOTE — Transfer of Care (Signed)
Immediate Anesthesia Transfer of Care Note  Patient: Erik Griffith  Procedure(s) Performed: LEFT HIP ARTHROSCOPY WITH LOOSE BODY REMOVAL AND LABRAL REPAIR (Left: Hip)  Patient Location: PACU  Anesthesia Type:General  Level of Consciousness: awake and alert   Airway & Oxygen Therapy: Patient Spontanous Breathing  Post-op Assessment: Report given to RN and Post -op Vital signs reviewed and stable  Post vital signs: Reviewed and stable  Last Vitals:  Vitals Value Taken Time  BP 165/111 03/18/22 1619  Temp    Pulse 89 03/18/22 1622  Resp 18 03/18/22 1622  SpO2 100 % 03/18/22 1622  Vitals shown include unvalidated device data.  Last Pain:  Vitals:   03/18/22 1054  TempSrc: Oral  PainSc: 3          Complications: No notable events documented.

## 2022-03-18 NOTE — Op Note (Signed)
Date of Surgery: 03/18/2022  INDICATIONS: Mr. Brallier is a 37 y.o.-year-old male with left intra-articular loose body.  The risk and benefits of the procedure were discussed in detail and documented in the pre-operative evaluation.   PREOPERATIVE DIAGNOSIS: 1.  Left intra-articular loose body 2. Left hip labral tear  POSTOPERATIVE DIAGNOSIS: Same.  PROCEDURE: 1.  Left hip labral repair 2.  Left hip removal of loose body  SURGEON: Yevonne Pax MD  ASSISTANT: Raynelle Fanning, ATC  ANESTHESIA:  general  IV FLUIDS AND URINE: See anesthesia record.  ANTIBIOTICS: Ancef  ESTIMATED BLOOD LOSS: 10 mL.  IMPLANTS:  Implant Name Type Inv. Item Serial No. Manufacturer Lot No. LRB No. Used Action  ANCHOR SUT 1.4 FLEX - [id number] Anchor ANCHOR SUT 1.4 FLEX  [employer] [id number] Left 1 Implanted    DRAINS: None  CULTURES: None  COMPLICATIONS: none  DESCRIPTION OF PROCEDURE:  Cartilage Intact femoral and acetabular cartilage, positive rug lesion from the 10 to 12 o'clock position   Labrum Torn and injected appearing   Boundaries of labral tear Convention (3 o'clock anterior, 9 o'clock posterior) Anterior boundary: 3 o'clock Posterior boundary: 12 o'clock  Intra-articular loose body at the junction of the fovea and the superior acetabular dome   OPERATIVE REPORT:  The patient was brought to the operating room, placed supine on the operating table, and bony prominences were padded.  The traction boots were applied with padding to ensure that safe traction could be applied through the feet.  The contralateral limb was abducted maximally and light traction was applied.  The operative leg was brought into neutral position.  The flouroscopic c-arm was brought between the legs for an AP image.  The patient was prepped and draped in a sterile fashion.  Time-out was performed and landmarks were identified. Traction was obtained and care was taken to ensure the least amount of force  necessary to allow safe access to the joint of 8-81mm.  This was checked with fluoroscopy.    Next we placed an anterolateral portal under the assistance of fluoroscopy.  First, fluoroscopy was used to estimate the trajectory and starting point.  A 25mm incision with a #11 blade was made and a straight hemostat was used to dilate the portal through the appropriate tract.  We then placed a 14-gauge hypodermic needle with careful technique to be as close to the femoral head as possible and parallel to the sorcele to ensure no iatrogenic damage to the labrum.  This released the negative pressure environment and the amount of traction was adjusted to maintain the 8-60mm of distraction.  A nitinol wire was placed through the needle and flouroscopy was used to ensure it extended to the medial wall of the acetabulum.  The Flowport from [employer] was placed over the wire and the nitinol wire was retracted to just inside the capsule during insertion of the dilator and cannula to minimize the risk of breakage. The arthroscope was placed next and we visualized the anterior triangle.     We then placed the anterior portal under direct visualization using the technique described above.  This was safely placed as well without damage to the labrum or femoral head.  We then switched our arthroscope to the anterior portal to ensure we were not through the labrum - we were safely through the capsule only.  We then proceeded with a transverse capsulotomy connecting the 2 portals in the same plane utilizing the Samurai blade from [employer].  We identified the anterior inferior iliac spine proximally, the psoas tendon medially and the rectus tendon laterally as landmarks.  We then proceeded with a diagnostic arthroscopy - the results can be found in the findings section above.  A posterior portal was established at the posterior lateral border of the greater trochanter.  A Kingfisher was introduced to the posterior  portal and the fragment was extracted.     We then used the 50 degree hip specific radiofrequency device from [employer]. and a 105mm shaver to clear the superior acetabulum and expose the subspinous region.  Next we exposed the acetabular rim leaving the chondral labral junction intact.  Working from both portals, the acetabular rim/subspinous region was reshaped with 5.5 mm bur consistent with the preoperative three-dimensional imaging.   When adequate reshaping was obtained we then proceeded with the labral repair. We placed 2 anchors at the 1:00 and 2:30 positions. The sutures were passed using the crescent Nanopass from [employer].  This resulted in anatomic labral repair.  We debrided the loose cartilage at the rim and residual degenerative labral tissue.  Traction was let down with total traction time of 49 minutes.     Finally, we performed a complete capsular closure with tape suture.  She was replaced in the anterior and posterior limb of the reported capsulotomy with excellent apposition. We then removed the arthroscope and closed the incisions with 3-0 nylon simple stitches.  A sterile dressing was applied..  The patient was awakened from anesthesia and transferred to PACU in stable condition. Postoperative care includes:       POSTOPERATIVE PLAN:    2 weeks touchdown weight bearing, hip labral repair protocol Formal physical therapy will begin this week. ASA 325 Daily for DVT prophylaxis     Yevonne Pax, MD 4:14 PM

## 2022-03-18 NOTE — Anesthesia Procedure Notes (Signed)
Procedure Name: LMA Insertion Date/Time: 03/18/2022 2:48 PM  Performed by: Carolan Clines, CRNAPre-anesthesia Checklist: Patient identified, Emergency Drugs available, Suction available and Patient being monitored Patient Re-evaluated:Patient Re-evaluated prior to induction Oxygen Delivery Method: Circle System Utilized Preoxygenation: Pre-oxygenation with 100% oxygen Induction Type: IV induction LMA: LMA inserted LMA Size: 5.0 Number of attempts: 1 Placement Confirmation: positive ETCO2 Tube secured with: Tape Dental Injury: Teeth and Oropharynx as per pre-operative assessment

## 2022-03-18 NOTE — Interval H&P Note (Signed)
History and Physical Interval Note:  03/18/2022 11:11 AM  Erik Griffith  has presented today for surgery, with the diagnosis of LEFT ACETABULAR LOOSE BODY.  The various methods of treatment have been discussed with the patient and family. After consideration of risks, benefits and other options for treatment, the patient has consented to  Procedure(s): LEFT HIP ARTHROSCOPY WITH LOOSE BODY REMOVAL (Left) as a surgical intervention.  The patient's history has been reviewed, patient examined, no change in status, stable for surgery.  I have reviewed the patient's chart and labs.  Questions were answered to the patient's satisfaction.     Vanetta Mulders

## 2022-03-18 NOTE — Anesthesia Preprocedure Evaluation (Addendum)
Anesthesia Evaluation  Patient identified by MRN, date of birth, ID band Patient awake    Reviewed: Allergy & Precautions, NPO status , Patient's Chart, lab work & pertinent test results  History of Anesthesia Complications Negative for: history of anesthetic complications  Airway Mallampati: II  TM Distance: >3 FB Neck ROM: Full    Dental no notable dental hx. (+) Dental Advisory Given   Pulmonary Current SmokerPatient did not abstain from smoking.   Pulmonary exam normal        Cardiovascular Normal cardiovascular exam     Neuro/Psych negative neurological ROS     GI/Hepatic negative GI ROS, Neg liver ROS,,,  Endo/Other  negative endocrine ROS    Renal/GU Renal disease     Musculoskeletal negative musculoskeletal ROS (+)    Abdominal   Peds  Hematology negative hematology ROS (+)   Anesthesia Other Findings   Reproductive/Obstetrics                             Anesthesia Physical Anesthesia Plan  ASA: 2  Anesthesia Plan: General   Post-op Pain Management: Toradol IV (intra-op)* and Tylenol PO (pre-op)*   Induction: Intravenous  PONV Risk Score and Plan: 2 and Ondansetron, Dexamethasone and Midazolam  Airway Management Planned: LMA  Additional Equipment:   Intra-op Plan:   Post-operative Plan: Extubation in OR  Informed Consent: I have reviewed the patients History and Physical, chart, labs and discussed the procedure including the risks, benefits and alternatives for the proposed anesthesia with the patient or authorized representative who has indicated his/her understanding and acceptance.     Dental advisory given  Plan Discussed with: Anesthesiologist and CRNA  Anesthesia Plan Comments:        Anesthesia Quick Evaluation

## 2022-03-18 NOTE — Brief Op Note (Signed)
   Brief Op Note  Date of Surgery: 03/18/2022  Preoperative Diagnosis: LEFT ACETABULAR LOOSE BODY  Postoperative Diagnosis: same  Procedure: Procedure(s): LEFT HIP ARTHROSCOPY WITH LOOSE BODY REMOVAL AND LABRAL REPAIR  Implants: Implant Name Type Inv. Item Serial No. Manufacturer Lot No. LRB No. Used Action  ANCHOR SUT 1.4 FLEX - [id number] Anchor ANCHOR SUT 1.4 FLEX  [employer] [id number] Left 1 Implanted    Surgeons: Surgeon(s): Vanetta Mulders, MD  Anesthesia: General    Estimated Blood Loss: See anesthesia record  Complications: None  Condition to PACU: Stable  Yevonne Pax, MD 03/18/2022 4:13 PM

## 2022-03-19 ENCOUNTER — Other Ambulatory Visit (HOSPITAL_BASED_OUTPATIENT_CLINIC_OR_DEPARTMENT_OTHER): Payer: Self-pay | Admitting: Orthopaedic Surgery

## 2022-03-19 DIAGNOSIS — M24052 Loose body in left hip: Secondary | ICD-10-CM

## 2022-03-23 NOTE — Therapy (Signed)
OUTPATIENT PHYSICAL THERAPY LOWER EXTREMITY EVALUATION   Patient Name: Erik Griffith MRN: [id number] DOB:December 19, 1985, 37 y.o., male Today's Date: 03/24/2022  END OF SESSION:  PT End of Session - 03/24/22 1322     Visit Number 1    Number of Visits 12    Date for PT Re-Evaluation 05/05/22    Authorization Type [place] medicaid    PT Start Time 1205    PT Stop Time 1245    PT Time Calculation (min) 40 min    Activity Tolerance Patient tolerated treatment well    Behavior During Therapy [facility] for tasks assessed/performed             History reviewed. No pertinent past medical history. Past Surgical History:  Procedure Laterality Date   APPENDECTOMY     FLEXOR TENDON REPAIR Left 03/07/2019   Procedure: LEFT THUMB FLEXOR TENDON REPAIR;  Surgeon: Milly Jakob, MD;  Location: [place];  Service: Orthopedics;  Laterality: Left;   IR FLUORO GUIDE CV LINE RIGHT  02/07/2021   IR US GUIDE VASC ACCESS RIGHT  02/07/2021   NERVE REPAIR Left 03/07/2019   Procedure: LEFT HAND NERVE REPAIR(S);  Surgeon: Milly Jakob, MD;  Location: [place];  Service: Orthopedics;  Laterality: Left;   Patient Active Problem List   Diagnosis Date Noted   Loose body in left hip 03/18/2022   Tear of left acetabular labrum 03/18/2022   Left cervical radiculopathy 06/11/2021   Hypocalcemia 02/07/2021   Non-traumatic rhabdomyolysis 02/07/2021   Acute renal failure ([place]) 02/07/2021   Hypertension 02/07/2021   Weakness generalized 02/07/2021   Paresthesia 02/07/2021   Acute kidney injury (AKI) with acute tubular necrosis (ATN) ([place]) 02/06/2021   Neck pain 01/13/2019   Neck strain, initial encounter 01/13/2019   Back spasm 01/13/2019   Lymphadenitis 01/13/2019    PCP: none  REFERRING PROVIDER: Vanetta Mulders MD  REFERRING DIAG: M24.052 (ICD-10-CM) - Loose body in left hip   THERAPY DIAG:  Pain in left hip  Difficulty in walking, not elsewhere classified  Muscle  weakness (generalized)  Rationale for Evaluation and Treatment: Rehabilitation  ONSET DATE: Dec 2023  SUBJECTIVE:   SUBJECTIVE STATEMENT: Was wrestling my nephew and dislocated my hip on Christmas eve  PERTINENT HISTORY: 03/18/22 Dr. Sammuel Hines PROCEDURE: 1.  Left hip labral repair 2.  Left hip removal of loose body PAIN:  Are you having pain? Yes: NPRS scale: current 4/10 worst 7/10 Pain location: left hip Pain description: ache Aggravating factors: movement Relieving factors: rest, meds  PRECAUTIONS: Fall  WEIGHT BEARING RESTRICTIONS: 2 weeks touchdown weight bearing, hip labral repair protocol  beginning 03/18/22  FALLS:  Has patient fallen in last 6 months? No  LIVING ENVIRONMENT: Lives with: lives with their family Lives in: House/apartment Stairs: No Has following equipment at home: Crutches  OCCUPATION: not working  PLOF: Independent  PATIENT GOALS: get better, walk without crutches  NEXT MD VISIT:  2  weeks  OBJECTIVE:   DIAGNOSTIC FINDINGS: 02/21/22 MPRESSION: 1. Comminuted fracture of the posterior wall of the left acetabulum. 1.9 x 0.7 cm posterior acetabular bone fragment displaced into the joint space along the posterior aspect of the ligamentum teres.  PATIENT SURVEYS:    COGNITION: Overall cognitive status: Within functional limits for tasks assessed     SENSATION: WFL  EDEMA:  Minimal left hip about incision  POSTURE: No Significant postural limitations  PALPATION: wfl  LOWER EXTREMITY ROM:  Passive ROM (as per protocol) Right eval Left eval  Hip flexion  90  Hip extension  N/a  Hip abduction  30  Hip adduction    Hip internal rotation  30  Hip external rotation  30  Knee flexion    Knee extension    Ankle dorsiflexion    Ankle plantarflexion    Ankle inversion    Ankle eversion     (Blank rows = not tested)  LOWER EXTREMITY MMT:  MMT Right eval Left eval  Hip flexion 5   Hip extension 5   Hip abduction 5   Hip  adduction 5   Hip internal rotation 5   Hip external rotation 5   Knee flexion 5   Knee extension 5   Ankle dorsiflexion    Ankle plantarflexion    Ankle inversion    Ankle eversion     (Blank rows = not tested)    FUNCTIONAL TESTS:    GAIT: Distance walked: 400 Assistive device utilized: Crutches Level of assistance: Complete Independence Comments: Instruction on TDWB as pt with slightly increased wbing upon arrival to session.  He verbalizes and demonstrates understanding   TODAY'S TREATMENT:                                                                                                                              DATE: 03/24/22 Evaluation Exercises - Supine Hip and Knee Flexion PROM with Caregiver  to 0- 90d - Supine Hip Internal Rotation 30d  external 20d PROM with Caregiver   - Supine Hip Abduction 30d PROM with Caregiver    -Left hip circumduction  in standing supported by cructhes    PATIENT EDUCATION:  Education details: Discussed eval findings, rehab rationale and POC and patient is in agreement. Protocol details Positional Precautions: Do NOT sit more than 20-30 minutes at a time. Do NOT lift your leg on its own (SLR). Do prone lying (on your stomach) for 2 hours per day.  Person educated: Patient and Spouse Education method: Explanation, Demonstration, and Handouts Education comprehension: verbalized understanding and returned demonstration  HOME EXERCISE PROGRAM: Access Code: [email or web address] URL: [email or web address] Date: 03/24/2022 Prepared by: Denton Meek  Exercises - Supine Hip and Knee Flexion PROM with Caregiver   - Supine Hip Internal Rotation PROM with Caregiver   - Supine Hip Abduction PROM with Caregiver     ASSESSMENT:  CLINICAL IMPRESSION: Patient is a 37 y.o. m who was seen today for physical therapy evaluation and treatment for  Left hip labral repair and Left hip removal of loose body. Pt had a left intra-articular  loose body after hip dislocation which was subsequently reduced in the emergency room.  Presents today in [place] left hip brace locked in 0d extension and 90d flex.  His pain is controlled with meds, rest and ice.  Wife present.  She is instructed and directed through PROm techniques as per protocol to be completed 2-3x daily in pain free range. Pt VU no SLR or hip extension.  He demonstrates maintaining TDWB after demonstration.  Plan to follow protocol as per Dr Sammuel Hines land based with added aquatics if appropriate.  Pt is good candidate for skilled physical therapy to return pt to PLOF   OBJECTIVE IMPAIRMENTS: difficulty walking, decreased ROM, decreased strength, and pain.   ACTIVITY LIMITATIONS: carrying, lifting, bending, standing, squatting, and stairs  PARTICIPATION LIMITATIONS: shopping, community activity, occupation, and yard work  PERSONAL FACTORS: Transportation are also affecting patient's functional outcome.   REHAB POTENTIAL: Good  CLINICAL DECISION MAKING: Stable/uncomplicated  EVALUATION COMPLEXITY: Low   GOALS: Goals reviewed with patient? Yes  SHORT TERM GOALS: Target date: 04/18/22 Pt to be compliant and indep with HEP to demonstrate commitment and progress towards LTG Baseline:instructed today Goal status: INITIAL  2.  Pt will have Pain free and non antalgic ambulation without assistive device for household and modified community mobility. Baseline:TDWB  Goal status: INITIAL  3.  Pt will enter Phase II of protocol- ADL's and return to community based function without pain or irritation. Baseline: Phase I Goal status: INITIAL  4.  Left Hip PROM 80% of contralateral side. Baseline: limited as per protocol Goal status: INITIAL  5.  Pt will complete 30 second sit to stand test pain free to completion and without compensation. Baseline: not completed Goal status: INITIAL  6.  Pt will complete SL stance 30 sec without compensation of the femur and pelvis in  any plane of motion. Baseline: not complete Goal status: INITIAL  LONG TERM GOALS: Target date: May 05, 2022  Pt to return to PLOF ([place]) entering Phase III of protocol Baseline: Phase 1 Goal status: INITIAL  2.  Other LTG to be established at re-assessment Baseline:  Goal status: INITIAL    PLAN:  PT FREQUENCY: 1-2x/week  PT DURATION: 6 weeks  PLANNED INTERVENTIONS: Therapeutic exercises, Therapeutic activity, Neuromuscular re-education, Balance training, Gait training, Patient/Family education, Self Care, Joint mobilization, Stair training, Orthotic/Fit training, DME instructions, Aquatic Therapy, Dry Needling, Electrical stimulation, Cryotherapy, Moist heat, Compression bandaging, Taping, Contrast bath, Ionotophoresis 57m/ml Dexamethasone, Manual therapy, and Re-evaluation  PLAN FOR NEXT SESSION: Progress phase I of protocol   FDenton Meek PTMPT 03/24/2022, 2:33 PM   Check all possible CPT codes: 97164 - PT Re-evaluation, 97110- Therapeutic Exercise, 97116 - Gait Training, 97140 - Manual Therapy, 97530 - Therapeutic Activities, 97535 - Self Care, 97014 - Electrical stimulation (unattended), [id number]- Electrical stimulation (Manual), [id number]- Iontophoresis, [id number]- Ultrasound, [id number]- Orthotic Fit, [id number]- Physical performance training, and [id number]- Aquatic therapy    Check all conditions that are expected to impact treatment: None of these apply   If treatment provided at initial evaluation, no treatment charged due to lack of authorization.

## 2022-03-24 ENCOUNTER — Other Ambulatory Visit: Payer: Self-pay

## 2022-03-24 ENCOUNTER — Encounter (HOSPITAL_BASED_OUTPATIENT_CLINIC_OR_DEPARTMENT_OTHER): Payer: Self-pay | Admitting: Physical Therapy

## 2022-03-24 ENCOUNTER — Ambulatory Visit (HOSPITAL_BASED_OUTPATIENT_CLINIC_OR_DEPARTMENT_OTHER): Payer: Medicaid Other | Attending: Orthopaedic Surgery | Admitting: Physical Therapy

## 2022-03-24 DIAGNOSIS — M24052 Loose body in left hip: Secondary | ICD-10-CM | POA: Diagnosis not present

## 2022-03-24 DIAGNOSIS — M6281 Muscle weakness (generalized): Secondary | ICD-10-CM

## 2022-03-24 DIAGNOSIS — R262 Difficulty in walking, not elsewhere classified: Secondary | ICD-10-CM | POA: Diagnosis present

## 2022-03-24 DIAGNOSIS — M25552 Pain in left hip: Secondary | ICD-10-CM | POA: Diagnosis present

## 2022-03-25 ENCOUNTER — Encounter (HOSPITAL_COMMUNITY): Payer: Self-pay | Admitting: Orthopaedic Surgery

## 2022-03-28 ENCOUNTER — Ambulatory Visit (INDEPENDENT_AMBULATORY_CARE_PROVIDER_SITE_OTHER): Payer: Medicaid Other | Admitting: Orthopaedic Surgery

## 2022-03-28 DIAGNOSIS — M24052 Loose body in left hip: Secondary | ICD-10-CM

## 2022-03-28 NOTE — Progress Notes (Signed)
Post Operative Evaluation    Procedure/Date of Surgery: Left hip arthroscopy with labral repair and removal of loose body 03/18/22  Interval History:   Presents today 2 weeks status post above procedure.  Overall he is doing extremely well.  He states that the groin pain he was feeling with activity is now gone.  Does have some soreness.  Anterior lateral aspect of the hip.  He is overall continuing to improve. Has been compliant with brace usage.   PMH/PSH/Family History/Social History/Meds/Allergies:   No past medical history on file. Past Surgical History:  Procedure Laterality Date   APPENDECTOMY     FLEXOR TENDON REPAIR Left 03/07/2019   Procedure: LEFT THUMB FLEXOR TENDON REPAIR;  Surgeon: Milly Jakob, MD;  Location: [place];  Service: Orthopedics;  Laterality: Left;   HIP ARTHROSCOPY Left 03/18/2022   Procedure: LEFT HIP ARTHROSCOPY WITH LOOSE BODY REMOVAL AND LABRAL REPAIR;  Surgeon: Vanetta Mulders, MD;  Location: [place];  Service: Orthopedics;  Laterality: Left;   IR FLUORO GUIDE CV LINE RIGHT  02/07/2021   IR US GUIDE VASC ACCESS RIGHT  02/07/2021   NERVE REPAIR Left 03/07/2019   Procedure: LEFT HAND NERVE REPAIR(S);  Surgeon: Milly Jakob, MD;  Location: [place];  Service: Orthopedics;  Laterality: Left;   Social History   Socioeconomic History   Marital status: Married    Spouse name: Not on file   Number of children: Not on file   Years of education: Not on file   Highest education level: Not on file  Occupational History   Not on file  Tobacco Use   Smoking status: Every Day    Packs/day: 0.25    Types: Cigarettes   Smokeless tobacco: Never  Substance and Sexual Activity   Alcohol use: Yes    Comment: occ   Drug use: Yes    Types: Marijuana   Sexual activity: Not on file  Other Topics Concern   Not on file  Social History Narrative   Not on file   Social Determinants of Health    Financial Resource Strain: Not on file  Food Insecurity: Not on file  Transportation Needs: Not on file  Physical Activity: Not on file  Stress: Not on file  Social Connections: Not on file   No family history on file. Allergies  Allergen Reactions   Latex Rash   Current Outpatient Medications  Medication Sig Dispense Refill   amLODipine (NORVASC) 10 MG tablet Take 1 tablet (10 mg total) by mouth daily. (Patient not taking: Reported on 02/02/2022) 30 tablet 2   aspirin EC 325 MG tablet Take 1 tablet (325 mg total) by mouth daily. (Patient not taking: Reported on 03/17/2022) 30 tablet 0   carvedilol (COREG) 25 MG tablet Take 1 tablet (25 mg total) by mouth 2 (two) times daily with a meal. (Patient not taking: Reported on 02/02/2022) 60 tablet 2   cyclobenzaprine (FLEXERIL) 10 MG tablet Take 1 tablet (10 mg total) by mouth 2 (two) times daily as needed for muscle spasms. (Patient not taking: Reported on 02/02/2022) 60 tablet 1   gabapentin (NEURONTIN) 300 MG capsule Take 1 capsule (300 mg total) by mouth 3 (three) times daily as needed. (Patient not taking: Reported on 02/02/2022) 90 capsule 3   hydrALAZINE (APRESOLINE) 100 MG tablet  Take 0.5 tablets (50 mg total) by mouth every 12 (twelve) hours. (Patient not taking: Reported on 02/02/2022) 60 tablet 2   ondansetron (ZOFRAN) 4 MG tablet Take 1 tablet (4 mg total) by mouth every 6 (six) hours. (Patient not taking: Reported on 03/17/2022) 12 tablet 0   ondansetron (ZOFRAN) 4 MG tablet Take 1 tablet (4 mg total) by mouth every 6 (six) hours. (Patient not taking: Reported on 03/17/2022) 12 tablet 0   oxyCODONE (OXY IR/ROXICODONE) 5 MG immediate release tablet Take 1 tablet (5 mg total) by mouth every 4 (four) hours as needed (severe pain). (Patient not taking: Reported on 03/17/2022) 20 tablet 0   sevelamer carbonate (RENVELA) 800 MG tablet Take 3 tablets (2,400 mg total) by mouth 3 (three) times daily with meals. (Patient not taking: Reported on  02/02/2022) 60 tablet 0   topiramate (TOPAMAX) 50 MG tablet Take 1 tablet (50 mg total) by mouth daily. (Patient not taking: Reported on 02/02/2022) 90 tablet 3   No current facility-administered medications for this visit.   No results found.  Review of Systems:   A ROS was performed including pertinent positives and negatives as documented in the HPI.   Musculoskeletal Exam:    There were no vitals taken for this visit.  Left hip incisions are well-appearing without erythema or drainage.  He has 90 degrees of flexion of the left hip without pain.  There is 3 degrees internal and external rotation without pain.  Distal neurosensory exam is intact.  Imaging:      I personally reviewed and interpreted the radiographs.   Assessment:   2-week status post left hip arthroscopy with labral repair and removal of loose body.  Overall he is doing extremely well.  At this time we will plan to advance his weightbearing to weightbearing as tolerated.  He will continue to progress through the labral repair protocol.  I will plan to see him back in 4 weeks for reassessment  Plan :    -Return to clinic in 4 weeks for reassessment      I personally saw and evaluated the patient, and participated in the management and treatment plan.  Vanetta Mulders, MD Attending Physician, Orthopedic Surgery  This document was dictated using Dragon voice recognition software. A reasonable attempt at proof reading has been made to minimize errors.

## 2022-04-03 ENCOUNTER — Encounter (HOSPITAL_BASED_OUTPATIENT_CLINIC_OR_DEPARTMENT_OTHER): Payer: Self-pay

## 2022-04-03 ENCOUNTER — Ambulatory Visit (HOSPITAL_BASED_OUTPATIENT_CLINIC_OR_DEPARTMENT_OTHER): Payer: Medicaid Other

## 2022-04-03 DIAGNOSIS — M25552 Pain in left hip: Secondary | ICD-10-CM | POA: Diagnosis not present

## 2022-04-03 DIAGNOSIS — M6281 Muscle weakness (generalized): Secondary | ICD-10-CM

## 2022-04-03 DIAGNOSIS — R262 Difficulty in walking, not elsewhere classified: Secondary | ICD-10-CM

## 2022-04-03 NOTE — Therapy (Signed)
OUTPATIENT PHYSICAL THERAPY LOWER EXTREMITY EVALUATION   Patient Name: Erik Griffith MRN: [id number] DOB:1985/09/27, 37 y.o., male Today's Date: 04/03/2022  END OF SESSION:  PT End of Session - 04/03/22 0911     Visit Number 2    Number of Visits 12    Date for PT Re-Evaluation 05/05/22    Authorization Type [place] medicaid    PT Start Time [phone]    PT Stop Time 0930    PT Time Calculation (min) 40 min    Activity Tolerance Patient tolerated treatment well    Behavior During Therapy [facility] for tasks assessed/performed              History reviewed. No pertinent past medical history. Past Surgical History:  Procedure Laterality Date   APPENDECTOMY     FLEXOR TENDON REPAIR Left 03/07/2019   Procedure: LEFT THUMB FLEXOR TENDON REPAIR;  Surgeon: Milly Jakob, MD;  Location: [place];  Service: Orthopedics;  Laterality: Left;   HIP ARTHROSCOPY Left 03/18/2022   Procedure: LEFT HIP ARTHROSCOPY WITH LOOSE BODY REMOVAL AND LABRAL REPAIR;  Surgeon: Vanetta Mulders, MD;  Location: [place];  Service: Orthopedics;  Laterality: Left;   IR FLUORO GUIDE CV LINE RIGHT  02/07/2021   IR US GUIDE VASC ACCESS RIGHT  02/07/2021   NERVE REPAIR Left 03/07/2019   Procedure: LEFT HAND NERVE REPAIR(S);  Surgeon: Milly Jakob, MD;  Location: [place];  Service: Orthopedics;  Laterality: Left;   Patient Active Problem List   Diagnosis Date Noted   Loose body in left hip 03/18/2022   Tear of left acetabular labrum 03/18/2022   Left cervical radiculopathy 06/11/2021   Hypocalcemia 02/07/2021   Non-traumatic rhabdomyolysis 02/07/2021   Acute renal failure ([place]) 02/07/2021   Hypertension 02/07/2021   Weakness generalized 02/07/2021   Paresthesia 02/07/2021   Acute kidney injury (AKI) with acute tubular necrosis (ATN) ([place]) 02/06/2021   Neck pain 01/13/2019   Neck strain, initial encounter 01/13/2019   Back spasm 01/13/2019   Lymphadenitis 01/13/2019    PCP:  none  REFERRING PROVIDER: Vanetta Mulders MD  REFERRING DIAG: M24.052 (ICD-10-CM) - Loose body in left hip   THERAPY DIAG:  Pain in left hip  Difficulty in walking, not elsewhere classified  Muscle weakness (generalized)  Rationale for Evaluation and Treatment: Rehabilitation  ONSET DATE: Dec 2023  SUBJECTIVE:   SUBJECTIVE STATEMENT: Pt reports MD visit went well. Pain is well managed without medication. Able to ambulate WBAT with bilateral axillary crutches.   PERTINENT HISTORY: 03/18/22 Dr. Sammuel Hines PROCEDURE: 1.  Left hip labral repair 2.  Left hip removal of loose body PAIN:  Are you having pain? Yes: NPRS scale: current 3/10 worst 7/10 Pain location: left hip Pain description: ache Aggravating factors: movement Relieving factors: rest, meds  PRECAUTIONS: Fall  WEIGHT BEARING RESTRICTIONS: 2 weeks touchdown weight bearing, hip labral repair protocol  beginning 03/18/22  FALLS:  Has patient fallen in last 6 months? 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 Comminuted fracture of the posterior wall of the left acetabulum. 1.9 x 0.7 cm posterior acetabular bone fragment displaced into the joint space along the posterior aspect of the ligamentum teres.  PATIENT SURVEYS:    COGNITION:  Overall cognitive status: Within functional limits for tasks assessed     SENSATION: WFL  EDEMA:  Minimal left hip about incision  POSTURE: No Significant postural limitations  PALPATION: wfl  LOWER EXTREMITY ROM:  Passive ROM (as per protocol) Right eval Left eval  Hip flexion  90  Hip extension  N/a  Hip abduction  30  Hip adduction    Hip internal rotation   30  Hip external rotation  30  Knee flexion    Knee extension    Ankle dorsiflexion    Ankle plantarflexion    Ankle inversion    Ankle eversion     (Blank rows = not tested)  LOWER EXTREMITY MMT:  MMT Right eval Left eval  Hip flexion 5   Hip extension 5   Hip abduction 5   Hip adduction 5   Hip internal rotation 5   Hip external rotation 5   Knee flexion 5   Knee extension 5   Ankle dorsiflexion    Ankle plantarflexion    Ankle inversion    Ankle eversion     (Blank rows = not tested)    FUNCTIONAL TESTS:    GAIT:    TODAY'S TREATMENT:                                                                                                                               DATE: 04/03/22  -Hip PROM within protocol limits -Glute sets 5" x20 -Hooklying PPT-5" x20 -BKFO- x10ea -Sidelying clams x10 -Reverse clams x10 -Prone IR/ER-2x10ea -Prone ER isometric with ball- 5" x10 -Prone HSC x10ea -Quadruped lateral shifts x10 ea   DATE: 03/24/22 Evaluation Exercises - Supine Hip and Knee Flexion PROM with Caregiver  to 0- 90d - Supine Hip Internal Rotation 30d  external 20d PROM with Caregiver   - Supine Hip Abduction 30d PROM with Caregiver    -Left hip circumduction  in standing supported by cructhes    PATIENT EDUCATION:  Education details: Discussed eval findings, rehab rationale and POC and patient is in agreement. Protocol details Positional Precautions: Do NOT sit more than 20-30 minutes at a time. Do NOT lift your leg on its own (SLR). Do prone lying (on your stomach) for 2 hours per day.  Person educated: Patient and Spouse Education method: Explanation, Demonstration, and Handouts Education comprehension: verbalized understanding and returned demonstration  HOME EXERCISE PROGRAM: Access Code: [email or web address] URL: [email or web address] Date: 04/03/2022 Prepared by: Sherlynn Carbon  Exercises - Supine Hip and Knee Flexion PROM with Caregiver  - 1  x daily - 7 x weekly - 3 sets - 10 reps - Supine Hip Internal Rotation PROM with Caregiver  - 1 x daily - 7 x weekly - 3 sets - 10 reps - Supine Hip Abduction PROM with Caregiver   - 1 x daily - 7 x weekly - 3 sets - 10 reps - Supine Posterior Pelvic Tilt  - 1 x daily -  7 x weekly - 3 sets - 10 reps - 5sec hold - Clamshell  - 1 x daily - 7 x weekly - 2 sets - 10 reps - Sidelying Reverse Clamshell  - 1 x daily - 7 x weekly - 2 sets - 10 reps - Prone Knee Flexion  - 1 x daily - 7 x weekly - 3 sets - 10 reps - Quadruped Side to Side Weight Shifts  - 1 x daily - 7 x weekly - 2 sets - 10 reps   ASSESSMENT:  CLINICAL IMPRESSION: Patient is 2 weeks and 2 days post surgery. He was educated about protocol and restrictions. Instructed to continue with crutches and brace WBAT until 3 weeks, then will reassess. Pt denied pain with PROM within protocol allowance. He was able to progress with gentle hip exercises today. Updated HEP with pt understanding importance of staying in pain limits. Instructed him to continue use of ice PRN. Will continue to progress as tolerated.    OBJECTIVE IMPAIRMENTS: difficulty walking, decreased ROM, decreased strength, and pain.   ACTIVITY LIMITATIONS: carrying, lifting, bending, standing, squatting, and stairs  PARTICIPATION LIMITATIONS: shopping, community activity, occupation, and yard work  PERSONAL FACTORS: Transportation are also affecting patient's functional outcome.   REHAB POTENTIAL: Good  CLINICAL DECISION MAKING: Stable/uncomplicated  EVALUATION COMPLEXITY: Low   GOALS: Goals reviewed with patient? Yes  SHORT TERM GOALS: Target date: 04/18/22 Pt to be compliant and indep with HEP to demonstrate commitment and progress towards LTG Baseline:instructed today Goal status: IN PROGRESS  2.  Pt will have Pain free and non antalgic ambulation without assistive device for household and modified community mobility. Baseline:TDWB  Goal status: INITIAL  3.   Pt will enter Phase II of protocol- ADL's and return to community based function without pain or irritation. Baseline: Phase I Goal status: INITIAL  4.  Left Hip PROM 80% of contralateral side. Baseline: limited as per protocol Goal status: INITIAL  5.  Pt will complete 30 second sit to stand test pain free to completion and without compensation. Baseline: not completed Goal status: INITIAL  6.  Pt will complete SL stance 30 sec without compensation of the femur and pelvis in any plane of motion. Baseline: not complete Goal status: INITIAL  LONG TERM GOALS: Target date: May 05, 2022  Pt to return to PLOF (Indep) entering Phase III of protocol Baseline: Phase 1 Goal status: INITIAL  2.  Other LTG to be established at re-assessment Baseline:  Goal status: INITIAL    PLAN:  PT FREQUENCY: 1-2x/week  PT DURATION: 6 weeks  PLANNED INTERVENTIONS: Therapeutic exercises, Therapeutic activity, Neuromuscular re-education, Balance training, Gait training, Patient/Family education, Self Care, Joint mobilization, Stair training, Orthotic/Fit training, DME instructions, Aquatic Therapy, Dry Needling, Electrical stimulation, Cryotherapy, Moist heat, Compression bandaging, Taping, Contrast bath, Ionotophoresis 50m/ml Dexamethasone, Manual therapy, and Re-evaluation  PLAN FOR NEXT SESSION: Progress phase I of protocol   RDeniece Ree PTAMPT 04/03/2022, 9:39 AM   Check all possible CPT codes: 97164 - PT Re-evaluation, 97110- Therapeutic Exercise, [id number]- Gait Training, 97140 - Manual Therapy, 97530 - Therapeutic Activities, 97535 - Self Care, 97014 - Electrical stimulation (unattended), [id number]- Electrical stimulation (Manual), [id number]- Iontophoresis, [id number]- Ultrasound, [id number]- Orthotic Fit, [id number]- Physical performance training, and [id number]- Aquatic therapy    Check all conditions that are expected to impact treatment: None of these apply   If treatment provided at initial evaluation,  no treatment charged due to lack of authorization.

## 2022-04-10 ENCOUNTER — Encounter (HOSPITAL_BASED_OUTPATIENT_CLINIC_OR_DEPARTMENT_OTHER): Payer: Self-pay

## 2022-04-10 ENCOUNTER — Ambulatory Visit (HOSPITAL_BASED_OUTPATIENT_CLINIC_OR_DEPARTMENT_OTHER): Payer: Medicaid Other

## 2022-04-10 DIAGNOSIS — M25552 Pain in left hip: Secondary | ICD-10-CM | POA: Diagnosis not present

## 2022-04-10 DIAGNOSIS — M6281 Muscle weakness (generalized): Secondary | ICD-10-CM

## 2022-04-10 DIAGNOSIS — R262 Difficulty in walking, not elsewhere classified: Secondary | ICD-10-CM

## 2022-04-10 NOTE — Therapy (Signed)
OUTPATIENT PHYSICAL THERAPY LOWER EXTREMITY EVALUATION   Patient Name: Erik Griffith MRN: [id number] DOB:Nov 20, 1985, 37 y.o., male Today's Date: 04/10/2022  END OF SESSION:  PT End of Session - 04/10/22 1438     Visit Number 3    Number of Visits 12    Date for PT Re-Evaluation 05/05/22    Authorization Type [place] medicaid    PT Start Time 1345    PT Stop Time 1425    PT Time Calculation (min) 40 min    Activity Tolerance Patient tolerated treatment well    Behavior During Therapy [facility] for tasks assessed/performed               History reviewed. No pertinent past medical history. Past Surgical History:  Procedure Laterality Date   APPENDECTOMY     FLEXOR TENDON REPAIR Left 03/07/2019   Procedure: LEFT THUMB FLEXOR TENDON REPAIR;  Surgeon: Milly Jakob, MD;  Location: [facility];  Service: Orthopedics;  Laterality: Left;   HIP ARTHROSCOPY Left 03/18/2022   Procedure: LEFT HIP ARTHROSCOPY WITH LOOSE BODY REMOVAL AND LABRAL REPAIR;  Surgeon: Vanetta Mulders, MD;  Location: [place];  Service: Orthopedics;  Laterality: Left;   IR FLUORO GUIDE CV LINE RIGHT  02/07/2021   IR US GUIDE VASC ACCESS RIGHT  02/07/2021   NERVE REPAIR Left 03/07/2019   Procedure: LEFT HAND NERVE REPAIR(S);  Surgeon: Milly Jakob, MD;  Location: [place];  Service: Orthopedics;  Laterality: Left;   Patient Active Problem List   Diagnosis Date Noted   Loose body in left hip 03/18/2022   Tear of left acetabular labrum 03/18/2022   Left cervical radiculopathy 06/11/2021   Hypocalcemia 02/07/2021   Non-traumatic rhabdomyolysis 02/07/2021   Acute renal failure ([place]) 02/07/2021   Hypertension 02/07/2021   Weakness generalized 02/07/2021   Paresthesia 02/07/2021   Acute kidney injury (AKI) with acute tubular necrosis (ATN) ([place]) 02/06/2021   Neck pain 01/13/2019   Neck strain, initial encounter 01/13/2019   Back spasm 01/13/2019   Lymphadenitis 01/13/2019    PCP:  none  REFERRING PROVIDER: Vanetta Mulders MD  REFERRING DIAG: [phone] (ICD-10-CM) - Loose body in left hip   THERAPY DIAG:  Pain in left hip  Difficulty in walking, not elsewhere classified  Muscle weakness (generalized)  Rationale for Evaluation and Treatment: Rehabilitation  ONSET DATE: Dec 2023  SUBJECTIVE:   SUBJECTIVE STATEMENT: Pt reports he has been using single crutch without pain or difficulty. He arrives without brace donned, stating he messed up the straps.   PERTINENT HISTORY: 03/18/22 Dr. Sammuel Hines PROCEDURE: 1.  Left hip labral repair 2.  Left hip removal of loose body PAIN:  Are you having pain? Yes: NPRS scale: current 3/10 worst 7/10 Pain location: left hip Pain description: ache Aggravating factors: movement Relieving factors: rest, meds  PRECAUTIONS: Fall  WEIGHT BEARING RESTRICTIONS: 2 weeks touchdown weight bearing, hip labral repair protocol  beginning 03/18/22  FALLS:  Has patient fallen in last 6 months? 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 Comminuted fracture of the posterior wall of the left acetabulum. 1.9 x 0.7 cm posterior acetabular bone fragment displaced into the joint space along the posterior aspect of the ligamentum teres.  PATIENT SURVEYS:  COGNITION: Overall cognitive status: Within functional limits for tasks assessed     SENSATION: WFL  EDEMA:  Minimal left hip about incision  POSTURE: No Significant postural limitations  PALPATION: wfl  LOWER EXTREMITY ROM:  Passive ROM (as per protocol) Right eval Left eval  Hip flexion  90  Hip extension  N/a  Hip abduction  30  Hip adduction    Hip internal  rotation  30  Hip external rotation  30  Knee flexion    Knee extension    Ankle dorsiflexion    Ankle plantarflexion    Ankle inversion    Ankle eversion     (Blank rows = not tested)  LOWER EXTREMITY MMT:  MMT Right eval Left eval  Hip flexion 5   Hip extension 5   Hip abduction 5   Hip adduction 5   Hip internal rotation 5   Hip external rotation 5   Knee flexion 5   Knee extension 5   Ankle dorsiflexion    Ankle plantarflexion    Ankle inversion    Ankle eversion     (Blank rows = not tested)    FUNCTIONAL TESTS:    GAIT:    TODAY'S TREATMENT:                                                                                                                               DATE: 04/10/22  -Hip PROM within protocol limits -Gait training- no brace- single crutch x300' -Nu step x32mn L4  -Supine march 2x10L -Sidelying hip abduction 2x10L -Prone IR/ER-2x10ea  -Prone HSC 2x10ea -Quadruped lateral shifts x15 ea -Sit to stands to elevated plinth-1x10 Stairs- step to pattern 1/2 flight with crutch.   DATE: 04/03/22  -Hip PROM within protocol limits -Glute sets 5" x20 -Hooklying PPT-5" x20 -BKFO- x10ea -Sidelying clams x10 -Reverse clams x10 -Prone IR/ER-2x10ea -Prone ER isometric with ball- 5" x10 -Prone HSC x10ea -Quadruped lateral shifts x10 ea   DATE: 03/24/22 Evaluation Exercises - Supine Hip and Knee Flexion PROM with Caregiver  to 0- 90d - Supine Hip Internal Rotation 30d  external 20d PROM with Caregiver   - Supine Hip Abduction 30d PROM with Caregiver    -Left hip circumduction  in standing supported by cructhes    PATIENT EDUCATION:  Education details: Discussed eval findings, rehab rationale and POC and patient is in agreement. Protocol details Positional Precautions: Do NOT sit more than 20-30 minutes at a time. Do NOT lift your leg on its own (SLR). Do prone lying (on your stomach) for 2 hours per day.  Person educated: Patient and  Spouse Education method: Explanation, Demonstration, and Handouts Education comprehension: verbalized understanding and returned demonstration  HOME EXERCISE PROGRAM: Access Code: [email or web address] URL: [email or web address] Date: 04/03/2022 Prepared by: RSherlynn Carbon Exercises - Supine Hip and Knee Flexion PROM with Caregiver  - 1 x daily - 7 x weekly - 3 sets - 10 reps - Supine  Hip Internal Rotation PROM with Caregiver  - 1 x daily - 7 x weekly - 3 sets - 10 reps - Supine Hip Abduction PROM with Caregiver   - 1 x daily - 7 x weekly - 3 sets - 10 reps - Supine Posterior Pelvic Tilt  - 1 x daily - 7 x weekly - 3 sets - 10 reps - 5sec hold - Clamshell  - 1 x daily - 7 x weekly - 2 sets - 10 reps - Sidelying Reverse Clamshell  - 1 x daily - 7 x weekly - 2 sets - 10 reps - Prone Knee Flexion  - 1 x daily - 7 x weekly - 3 sets - 10 reps - Quadruped Side to Side Weight Shifts  - 1 x daily - 7 x weekly - 2 sets - 10 reps   ASSESSMENT:  CLINICAL IMPRESSION: Patient is 3 weeks post surgery. Pt is doing very well and able to ambulate without compensatory pattern using single crutch in clinic. He does report fatigue with longer distances however. Worked on step to stair climbing as pt may be attending a show at the [employer]. Educated pt about avoiding crowds as much as possible and taking care with stair climbing if he chooses to attend. Again educated about precautions and healing timeline. Will continue to progress as tolerated with protocol.   OBJECTIVE IMPAIRMENTS: difficulty walking, decreased ROM, decreased strength, and pain.   ACTIVITY LIMITATIONS: carrying, lifting, bending, standing, squatting, and stairs  PARTICIPATION LIMITATIONS: shopping, community activity, occupation, and yard work  PERSONAL FACTORS: Transportation are also affecting patient's functional outcome.   REHAB POTENTIAL: Good  CLINICAL DECISION MAKING: Stable/uncomplicated  EVALUATION COMPLEXITY:  Low   GOALS: Goals reviewed with patient? Yes  SHORT TERM GOALS: Target date: 04/18/22 Pt to be compliant and indep with HEP to demonstrate commitment and progress towards LTG Baseline:instructed today Goal status: IN PROGRESS  2.  Pt will have Pain free and non antalgic ambulation without assistive device for household and modified community mobility. Baseline:TDWB  Goal status: INITIAL  3.  Pt will enter Phase II of protocol- ADL's and return to community based function without pain or irritation. Baseline: Phase I Goal status: INITIAL  4.  Left Hip PROM 80% of contralateral side. Baseline: limited as per protocol Goal status: IN PROGRESS  5.  Pt will complete 30 second sit to stand test pain free to completion and without compensation. Baseline: not completed Goal status: INITIAL  6.  Pt will complete SL stance 30 sec without compensation of the femur and pelvis in any plane of motion. Baseline: not complete Goal status: INITIAL  LONG TERM GOALS: Target date: May 05, 2022  Pt to return to PLOF (Indep) entering Phase III of protocol Baseline: Phase 1 Goal status: INITIAL  2.  Other LTG to be established at re-assessment Baseline:  Goal status: INITIAL    PLAN:  PT FREQUENCY: 1-2x/week  PT DURATION: 6 weeks  PLANNED INTERVENTIONS: Therapeutic exercises, Therapeutic activity, Neuromuscular re-education, Balance training, Gait training, Patient/Family education, Self Care, Joint mobilization, Stair training, Orthotic/Fit training, DME instructions, Aquatic Therapy, Dry Needling, Electrical stimulation, Cryotherapy, Moist heat, Compression bandaging, Taping, Contrast bath, Ionotophoresis '4mg'$ /ml Dexamethasone, Manual therapy, and Re-evaluation  PLAN FOR NEXT SESSION: Progress phase I of protocol   Graceann Congress Vern Guerette, PTAMPT 04/10/2022, 2:51 PM   Check all possible CPT codes: 97164 - PT Re-evaluation, 97110- Therapeutic Exercise, 97116 - Gait Training, 97140 -  Manual Therapy, [place] - Therapeutic Activities, 97535 -  Self Care, [phone] - Electrical stimulation (unattended), [id number] - Electrical stimulation (Manual), [id number] - Iontophoresis, [id number] - Ultrasound, [id number] - Orthotic Fit, [id number] - Physical performance training, and [id number] - Aquatic therapy    Check all conditions that are expected to impact treatment: None of these apply   If treatment provided at initial evaluation, no treatment charged due to lack of authorization.

## 2022-04-14 ENCOUNTER — Encounter (HOSPITAL_BASED_OUTPATIENT_CLINIC_OR_DEPARTMENT_OTHER): Payer: Medicaid Other

## 2022-04-17 ENCOUNTER — Encounter (HOSPITAL_BASED_OUTPATIENT_CLINIC_OR_DEPARTMENT_OTHER): Payer: Self-pay | Admitting: Physical Therapy

## 2022-04-17 ENCOUNTER — Ambulatory Visit (HOSPITAL_BASED_OUTPATIENT_CLINIC_OR_DEPARTMENT_OTHER): Payer: Medicaid Other | Attending: Orthopaedic Surgery | Admitting: Physical Therapy

## 2022-04-17 DIAGNOSIS — R262 Difficulty in walking, not elsewhere classified: Secondary | ICD-10-CM | POA: Diagnosis present

## 2022-04-17 DIAGNOSIS — M25552 Pain in left hip: Secondary | ICD-10-CM | POA: Diagnosis present

## 2022-04-17 DIAGNOSIS — M6281 Muscle weakness (generalized): Secondary | ICD-10-CM | POA: Diagnosis present

## 2022-04-17 NOTE — Therapy (Signed)
OUTPATIENT PHYSICAL THERAPY LOWER EXTREMITY TREATMENT   Patient Name: Erik Griffith MRN: [id number] DOB:[birth year], 37 y.o., male Today's Date: 04/17/2022  END OF SESSION:  PT End of Session - 04/17/22 1336     Visit Number 4    Number of Visits 12    Date for PT Re-Evaluation 05/05/22    Authorization Type [place] medicaid    PT Start Time 1302    PT Stop Time 1332    PT Time Calculation (min) 30 min    Activity Tolerance Patient tolerated treatment well    Behavior During Therapy [facility] for tasks assessed/performed                History reviewed. No pertinent past medical history. Past Surgical History:  Procedure Laterality Date   APPENDECTOMY     FLEXOR TENDON REPAIR Left 03/07/2019   Procedure: LEFT THUMB FLEXOR TENDON REPAIR;  Surgeon: Milly Jakob, MD;  Location: [place];  Service: Orthopedics;  Laterality: Left;   HIP ARTHROSCOPY Left 03/18/2022   Procedure: LEFT HIP ARTHROSCOPY WITH LOOSE BODY REMOVAL AND LABRAL REPAIR;  Surgeon: Vanetta Mulders, MD;  Location: [place];  Service: Orthopedics;  Laterality: Left;   IR FLUORO GUIDE CV LINE RIGHT  02/07/2021   IR US GUIDE VASC ACCESS RIGHT  02/07/2021   NERVE REPAIR Left 03/07/2019   Procedure: LEFT HAND NERVE REPAIR(S);  Surgeon: Milly Jakob, MD;  Location: [place];  Service: Orthopedics;  Laterality: Left;   Patient Active Problem List   Diagnosis Date Noted   Loose body in left hip 03/18/2022   Tear of left acetabular labrum 03/18/2022   Left cervical radiculopathy 06/11/2021   Hypocalcemia 02/07/2021   Non-traumatic rhabdomyolysis 02/07/2021   Acute renal failure ([place]) 02/07/2021   Hypertension 02/07/2021   Weakness generalized 02/07/2021   Paresthesia 02/07/2021   Acute kidney injury (AKI) with acute tubular necrosis (ATN) ([place]) 02/06/2021   Neck pain 01/13/2019   Neck strain, initial encounter 01/13/2019   Back spasm 01/13/2019   Lymphadenitis 01/13/2019    PCP:  none  REFERRING PROVIDER: Vanetta Mulders MD  REFERRING DIAG: [phone] (ICD-10-CM) - Loose body in left hip   THERAPY DIAG:  Pain in left hip  Difficulty in walking, not elsewhere classified  Muscle weakness (generalized)  Rationale for Evaluation and Treatment: Rehabilitation  ONSET DATE: Dec 2023  Days since surgery: 30   SUBJECTIVE:   SUBJECTIVE STATEMENT: Pt states he is sore with standing for longer periods but he feels good today. No pain with previous exercise. Pt states that he walked last week to a comedy show and had some more hip stiffness and pain due to distance walked  PERTINENT HISTORY: 03/18/22 Dr. Sammuel Hines PROCEDURE: 1.  Left hip labral repair 2.  Left hip removal of loose body PAIN:  Are you having pain? no: NPRS scale: current 0/10 worst 7/10 Pain location: left hip Pain description: ache Aggravating factors: movement Relieving factors: rest, meds PRECAUTIONS: Fall  WEIGHT BEARING RESTRICTIONS: 2 weeks touchdown weight bearing, hip labral repair protocol  beginning 03/18/22  FALLS:  Has patient fallen in last 6 months? 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 Comminuted fracture of the posterior wall of the left  acetabulum. 1.9 x 0.7 cm posterior acetabular bone fragment displaced into the joint space along the posterior aspect of the ligamentum teres.    TODAY'S TREATMENT:      3/7   -Recumbent bike  x99mn L2  Airex tandem balance 30s 3x; Airex SLS 30s 3x  Exercises - Supine Figure 4 Piriformis Stretch  - 2 x daily - 7 x weekly - 1 sets - 3 reps - 30 hold - Standing Hip Flexor Stretch  - 2 x daily - 7 x weekly - 1 sets - 2 reps -  30 hold - Sidelying Reverse Clamshell  - 1 x daily - 7 x weekly - 3 sets - 8 reps - Sit to Stand with Resistance Around Legs  - 1 x daily - 7 x weekly - 3 sets - 8 reps - Bridge with Posterior Pelvic Tilt  - 1 x daily - 7 x weekly - 3 sets - 8 reps - Clamshell with Resistance  - 1 x daily - 7 x weekly - 3 sets - 8 reps                                                                                                                      DATE: 04/10/22  -Hip PROM within protocol limits -Gait training- no brace- single crutch x300' -Nu step x589m L4  -Supine march 2x10L -Sidelying hip abduction 2x10L -Prone IR/ER-2x10ea  -Prone HSC 2x10ea -Quadruped lateral shifts x15 ea -Sit to stands to elevated plinth-1x10 Stairs- step to pattern 1/2 flight with crutch.   DATE: 04/03/22  -Hip PROM within protocol limits -Glute sets 5" x20 -Hooklying PPT-5" x20 -BKFO- x10ea -Sidelying clams x10 -Reverse clams x10 -Prone IR/ER-2x10ea -Prone ER isometric with ball- 5" x10 -Prone HSC x10ea -Quadruped lateral shifts x10 ea   DATE: 03/24/22 Evaluation Exercises - Supine Hip and Knee Flexion PROM with Caregiver  to 0- 90d - Supine Hip Internal Rotation 30d  external 20d PROM with Caregiver   - Supine Hip Abduction 30d PROM with Caregiver    -Left hip circumduction  in standing supported by cructhes    PATIENT EDUCATION:  Education details: anatomy, exercise progression, DOMS expectations, muscle firing,  envelope of function, HEP, POC  Positional Precautions: Do NOT sit more than 20-30 minutes at a time. Do NOT lift your leg on its own (SLR). Do prone lying (on your stomach) for 2 hours per day.  Person educated: Patient and Spouse Education method: Explanation, Demonstration, and Handouts Education comprehension: verbalized understanding and returned demonstration  HOME EXERCISE PROGRAM: Access Code: [email or web address] URL: [email or web address] Date: 04/03/2022 Prepared by:  ReSherlynn Carbon  ASSESSMENT:  CLINICAL IMPRESSION: Patient is now 4 weeks post surgery. Pt progressed per protocol and able to stretch into new ranges without pain. Resistance exercise also progressed with HEP updated today. Pt advised that he may begin UE gym exercise in seated positions, no axially loaded exercise at this time. HEP updated accordingly today. No pain noted during session. Pt progressing  well with rehab protocol.  Pt would benefit from continued skilled therapy in order to reach goals and maximize functional L LE strength and ROM for full return to PLOF.   OBJECTIVE IMPAIRMENTS: difficulty walking, decreased ROM, decreased strength, and pain.   ACTIVITY LIMITATIONS: carrying, lifting, bending, standing, squatting, and stairs  PARTICIPATION LIMITATIONS: shopping, community activity, occupation, and yard work  PERSONAL FACTORS: Transportation are also affecting patient's functional outcome.   REHAB POTENTIAL: Good  CLINICAL DECISION MAKING: Stable/uncomplicated  EVALUATION COMPLEXITY: Low   GOALS: Goals reviewed with patient? Yes  SHORT TERM GOALS: Target date: 04/18/22 Pt to be compliant and indep with HEP to demonstrate commitment and progress towards LTG Baseline:instructed today Goal status: IN PROGRESS  2.  Pt will have Pain free and non antalgic ambulation without assistive device for household and modified community mobility. Baseline:TDWB  Goal status: INITIAL  3.  Pt will enter Phase II of protocol- ADL's and return to community based function without pain or irritation. Baseline: Phase I Goal status: INITIAL  4.  Left Hip PROM 80% of contralateral side. Baseline: limited as per protocol Goal status: IN PROGRESS  5.  Pt will complete 30 second sit to stand test pain free to completion and without compensation. Baseline: not completed Goal status: INITIAL  6.  Pt will complete SL stance 30 sec without compensation of the femur and pelvis in any  plane of motion. Baseline: not complete Goal status: INITIAL  LONG TERM GOALS: Target date: May 05, 2022  Pt to return to PLOF (Indep) entering Phase III of protocol Baseline: Phase 1 Goal status: INITIAL  2.  Other LTG to be established at re-assessment Baseline:  Goal status: INITIAL    PLAN:  PT FREQUENCY: 1-2x/week  PT DURATION: 6 weeks  PLANNED INTERVENTIONS: Therapeutic exercises, Therapeutic activity, Neuromuscular re-education, Balance training, Gait training, Patient/Family education, Self Care, Joint mobilization, Stair training, Orthotic/Fit training, DME instructions, Aquatic Therapy, Dry Needling, Electrical stimulation, Cryotherapy, Moist heat, Compression bandaging, Taping, Contrast bath, Ionotophoresis '4mg'$ /ml Dexamethasone, Manual therapy, and Re-evaluation  PLAN FOR NEXT SESSION: Progress as per hip labral repair protocol   Daleen Bo, PT 04/17/2022, 1:38 PM   Check all possible CPT codes: [id number] - PT Re-evaluation, 97110- Therapeutic Exercise, [phone] - Gait Training, 97140 - Manual Therapy, 97530 - Therapeutic Activities, 97535 - Self Care, 97014 - Electrical stimulation (unattended), [id number] - Electrical stimulation (Manual), [id number] - Iontophoresis, [id number] - Ultrasound, [id number] - Orthotic Fit, [id number] - Physical performance training, and [id number] - Aquatic therapy    Check all conditions that are expected to impact treatment: None of these apply   If treatment provided at initial evaluation, no treatment charged due to lack of authorization.

## 2022-04-21 ENCOUNTER — Ambulatory Visit (HOSPITAL_BASED_OUTPATIENT_CLINIC_OR_DEPARTMENT_OTHER): Payer: Medicaid Other | Admitting: Physical Therapy

## 2022-04-21 ENCOUNTER — Encounter (HOSPITAL_BASED_OUTPATIENT_CLINIC_OR_DEPARTMENT_OTHER): Payer: Self-pay | Admitting: Physical Therapy

## 2022-04-21 ENCOUNTER — Encounter (HOSPITAL_BASED_OUTPATIENT_CLINIC_OR_DEPARTMENT_OTHER): Payer: Self-pay

## 2022-04-21 DIAGNOSIS — M25552 Pain in left hip: Secondary | ICD-10-CM

## 2022-04-21 DIAGNOSIS — M6281 Muscle weakness (generalized): Secondary | ICD-10-CM

## 2022-04-21 DIAGNOSIS — R262 Difficulty in walking, not elsewhere classified: Secondary | ICD-10-CM

## 2022-04-21 NOTE — Therapy (Signed)
  OUTPATIENT PHYSICAL THERAPY LOWER EXTREMITY TREATMENT   Patient Name: Erik Griffith MRN: [id number] DOB:12-Jun-1985, 37 y.o., male Today's Date: 04/21/2022  END OF SESSION:  PT End of Session - 04/21/22 1529     Visit Number 5    Number of Visits 12    Date for PT Re-Evaluation 05/05/22    Authorization Type [place] medicaid    PT Start Time 1530    Activity Tolerance Patient tolerated treatment well    Behavior During Therapy [facility] for tasks assessed/performed                History reviewed. No pertinent past medical history. Past Surgical History:  Procedure Laterality Date   APPENDECTOMY     FLEXOR TENDON REPAIR Left 03/07/2019   Procedure: LEFT THUMB FLEXOR TENDON REPAIR;  Surgeon: Milly Jakob, MD;  Location: [place];  Service: Orthopedics;  Laterality: Left;   HIP ARTHROSCOPY Left 03/18/2022   Procedure: LEFT HIP ARTHROSCOPY WITH LOOSE BODY REMOVAL AND LABRAL REPAIR;  Surgeon: Vanetta Mulders, MD;  Location: [place];  Service: Orthopedics;  Laterality: Left;   IR FLUORO GUIDE CV LINE RIGHT  02/07/2021   IR US GUIDE VASC ACCESS RIGHT  02/07/2021   NERVE REPAIR Left 03/07/2019   Procedure: LEFT HAND NERVE REPAIR(S);  Surgeon: Milly Jakob, MD;  Location: [place];  Service: Orthopedics;  Laterality: Left;   Patient Active Problem List   Diagnosis Date Noted   Loose body in left hip 03/18/2022   Tear of left acetabular labrum 03/18/2022   Left cervical radiculopathy 06/11/2021   Hypocalcemia 02/07/2021   Non-traumatic rhabdomyolysis 02/07/2021   Acute renal failure ([place]) 02/07/2021   Hypertension 02/07/2021   Weakness generalized 02/07/2021   Paresthesia 02/07/2021   Acute kidney injury (AKI) with acute tubular necrosis (ATN) ([place]) 02/06/2021   Neck pain 01/13/2019   Neck strain, initial encounter 01/13/2019   Back spasm 01/13/2019   Lymphadenitis 01/13/2019    PCP: none  REFERRING PROVIDER: Vanetta Mulders MD  REFERRING DIAG:  [phone] (ICD-10-CM) - Loose body in left hip   THERAPY DIAG:  No diagnosis found.  Rationale for Evaluation and Treatment: Rehabilitation  ONSET DATE: Dec 2023  Days since surgery: 34  Pt arrives to appt early and left prior to start of session.    Daleen Bo, PT 04/21/2022, 3:29 PM   Check all possible CPT codes: [phone] - PT Re-evaluation, 97110- Therapeutic Exercise, [phone] - Gait Training, [phone] - Manual Therapy, 97530 - Therapeutic Activities, [phone] - [place], [phone] - Electrical stimulation (unattended), [id number] - Electrical stimulation (Manual), [id number] - Iontophoresis, [id number] - Ultrasound, [id number] - Orthotic Fit, [id number] - Physical performance training, and [id number] - Aquatic therapy    Check all conditions that are expected to impact treatment: None of these apply   If treatment provided at initial evaluation, no treatment charged due to lack of authorization.

## 2022-04-24 ENCOUNTER — Ambulatory Visit (HOSPITAL_BASED_OUTPATIENT_CLINIC_OR_DEPARTMENT_OTHER): Payer: Medicaid Other

## 2022-04-24 ENCOUNTER — Encounter (HOSPITAL_BASED_OUTPATIENT_CLINIC_OR_DEPARTMENT_OTHER): Payer: Self-pay

## 2022-04-24 DIAGNOSIS — R262 Difficulty in walking, not elsewhere classified: Secondary | ICD-10-CM

## 2022-04-24 DIAGNOSIS — M25552 Pain in left hip: Secondary | ICD-10-CM

## 2022-04-24 DIAGNOSIS — M6281 Muscle weakness (generalized): Secondary | ICD-10-CM

## 2022-04-24 NOTE — Therapy (Addendum)
OUTPATIENT PHYSICAL THERAPY LOWER EXTREMITY TREATMENT PHYSICAL THERAPY DISCHARGE SUMMARY  Visits from Start of Care: 6  Current functional level related to goals / functional outcomes: unknown   Remaining deficits: unknown   Education / Equipment: Management of condition/ HEP   Patient agrees to discharge. Patient goals were partially met. Patient is being discharged due to not returning since the last visit.  Addend Corrie Dandy Tomma Lightning) Ziemba MPT 04/02/23 11:47 AM [facility] [address] [place], [place], [address] Phone: [phone]   Fax:  [phone]    Patient Name: Erik Griffith MRN: [id number] DOB:07-29-1985, 37 y.o., male Today's Date: 04/24/2022  END OF SESSION:  PT End of Session - 04/24/22 1355     Visit Number 6    Number of Visits 12    Date for PT Re-Evaluation 05/05/22    Authorization Type [place] medicaid    PT Start Time 1346    PT Stop Time 1429    PT Time Calculation (min) 43 min    Activity Tolerance Patient tolerated treatment well    Behavior During Therapy [facility] for tasks assessed/performed                 History reviewed. No pertinent past medical history. Past Surgical History:  Procedure Laterality Date   APPENDECTOMY     FLEXOR TENDON REPAIR Left 03/07/2019   Procedure: LEFT THUMB FLEXOR TENDON REPAIR;  Surgeon: Mack Hook, MD;  Location: [facility];  Service: Orthopedics;  Laterality: Left;   HIP ARTHROSCOPY Left 03/18/2022   Procedure: LEFT HIP ARTHROSCOPY WITH LOOSE BODY REMOVAL AND LABRAL REPAIR;  Surgeon: Huel Cote, MD;  Location[facility];  Service: Orthopedics;  Laterality: Left;   IR FLUORO GUIDE CV LINE RIGHT  02/07/2021   IR US GUIDE VASC ACCESS RIGHT  02/07/2021   NERVE REPAIR Left 03/07/2019   Procedure: LEFT HAND NERVE REPAIR(S);  Surgeon: Mack Hook, MD;  Location: [facility];  Service: Orthopedics;  Laterality: Left;   Patient  Active Problem List   Diagnosis Date Noted   Loose body in left hip 03/18/2022   Tear of left acetabular labrum 03/18/2022   Left cervical radiculopathy 06/11/2021   Hypocalcemia 02/07/2021   Non-traumatic rhabdomyolysis 02/07/2021   Acute renal failure ([facility]) 02/07/2021   Hypertension 02/07/2021   Weakness generalized 02/07/2021   Paresthesia 02/07/2021   Acute kidney injury (AKI) with acute tubular necrosis (ATN) ([facility]) 02/06/2021   Neck pain 01/13/2019   Neck strain, initial encounter 01/13/2019   Back spasm 01/13/2019   Lymphadenitis 01/13/2019    PCP: none  REFERRING PROVIDER: Huel Cote MD  REFERRING DIAG: [id number] (ICD-10-CM) - Loose body in left hip   THERAPY DIAG:  Pain in left hip  Difficulty in walking, not elsewhere classified  Muscle weakness (generalized)  Rationale for Evaluation and Treatment: Rehabilitation  ONSET DATE: Dec 2023  Days since surgery: 37   SUBJECTIVE:   SUBJECTIVE STATEMENT: Pt reports he has been going to gym and doing UE exercises and the bike.   PERTINENT HISTORY: 03/18/22 Dr. Steward Drone PROCEDURE: 1.  Left hip labral repair 2.  Left hip removal of loose body PAIN:  Are you having pain? no: NPRS scale: current 0/10 worst 7/10 Pain location: left hip Pain description: ache Aggravating factors: movement Relieving factors: rest, meds PRECAUTIONS: Fall  WEIGHT BEARING RESTRICTIONS: 2 weeks touchdown weight bearing, hip labral repair protocol  beginning 03/18/22  FALLS:  Has patient fallen in last 6 months? 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 Comminuted fracture of the posterior wall of the left acetabulum. 1.9 x 0.7 cm  posterior acetabular bone fragment displaced into the joint space along the posterior aspect of the ligamentum teres.    TODAY'S TREATMENT:      3/14   -Recumbent bike  x41min L21 -Hip PROM -Bridges 3x10 3sec hold -prone hip ext 3x10 -squats 3x10 -LAQ 5# x20 5sec hold -Side stepping at rail with slight squat- RTB around ankles         3/7   -Recumbent bike  x27min L2  Airex tandem balance 30s 3x; Airex SLS 30s 3x  Exercises - Supine Figure 4 Piriformis Stretch  - 2 x daily - 7 x weekly - 1 sets - 3 reps - 30 hold - Standing Hip Flexor Stretch  - 2 x daily - 7 x weekly - 1 sets - 2 reps - 30 hold - Sidelying Reverse Clamshell  - 1 x daily - 7 x weekly - 3 sets - 8 reps - Sit to Stand with Resistance Around Legs  - 1 x daily - 7 x weekly - 3 sets - 8 reps - Bridge with Posterior Pelvic Tilt  - 1 x daily - 7 x weekly - 3 sets - 8 reps - Clamshell with Resistance  - 1 x daily - 7 x weekly - 3 sets - 8 reps                                                                                                                      DATE: 04/10/22  -Hip PROM within protocol limits -Gait training- no brace- single crutch x300' -Nu step x52min L4  -Supine march 2x10L -Sidelying hip abduction 2x10L -Prone IR/ER-2x10ea  -Prone HSC 2x10ea -Quadruped lateral shifts x15 ea -Sit to stands to elevated plinth-1x10 Stairs- step to pattern 1/2 flight with crutch.      PATIENT EDUCATION:  Education details: anatomy, exercise progression, DOMS expectations, muscle firing,  envelope of function, HEP, POC  Positional Precautions: Do NOT sit more than 20-30 minutes at a time. Do NOT lift your leg on its own (SLR). Do prone lying (on your stomach) for 2 hours per day.  Person educated: Patient and Spouse Education method: Explanation, Demonstration, and Handouts Education comprehension: verbalized understanding and returned demonstration  HOME EXERCISE PROGRAM: Access Code: [email or web address] URL:  [email or web address] Date: 04/03/2022 Prepared by: Riki Altes    ASSESSMENT:  CLINICAL IMPRESSION: Patient is now 5 weeks post surgery. Educated pt about precautions at gym and machines to avoid when he questioned what he could do. Able to progress gently with exercises without complaint. No pain with PROM though some tightness present. 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 Sees MD for f/u tomorrow. Instructed to ask MD about RTW.  OBJECTIVE IMPAIRMENTS: difficulty walking, decreased ROM, decreased strength, and pain.   ACTIVITY LIMITATIONS: carrying, lifting, bending, standing, squatting, and stairs  PARTICIPATION LIMITATIONS: shopping, community activity, occupation, and yard work  PERSONAL FACTORS: Transportation are also affecting patient's functional outcome.   REHAB POTENTIAL: Good  CLINICAL DECISION MAKING: Stable/uncomplicated  EVALUATION COMPLEXITY: Low   GOALS: Goals reviewed with patient? Yes  SHORT TERM GOALS: Target date: 04/18/22 Pt to be compliant and indep with HEP to demonstrate commitment and progress towards LTG Baseline:instructed today Goal status: MET 3/14  2.  Pt will have Pain free and non antalgic ambulation without assistive device for household and modified community mobility. Baseline:TDWB  Goal status: IN PROGRESS (some discomfort with terminal stance)  3.  Pt will enter Phase II of protocol- ADL's and return to community based function without pain or irritation. Baseline: Phase I Goal status: INITIAL  4.  Left Hip PROM 80% of contralateral side. Baseline: limited as per protocol Goal status: IN PROGRESS  5.  Pt will complete 30 second sit to stand test pain free to completion and without compensation. Baseline: not completed Goal status: INITIAL  6.  Pt will complete SL stance 30 sec without compensation of the femur and pelvis in any plane of motion. Baseline: not complete Goal status:  INITIAL  LONG TERM GOALS: Target date: May 05, 2022  Pt to return to PLOF (Indep) entering Phase III of protocol Baseline: Phase 1 Goal status: INITIAL  2.  Other LTG to be established at re-assessment Baseline:  Goal status: INITIAL    PLAN:  PT FREQUENCY: 1-2x/week  PT DURATION: 6 weeks  PLANNED INTERVENTIONS: Therapeutic exercises, Therapeutic activity, Neuromuscular re-education, Balance training, Gait training, Patient/Family education, Self Care, Joint mobilization, Stair training, Orthotic/Fit training, DME instructions, Aquatic Therapy, Dry Needling, Electrical stimulation, Cryotherapy, Moist heat, Compression bandaging, Taping, Contrast bath, Ionotophoresis 4mg /ml Dexamethasone, Manual therapy, and Re-evaluation  PLAN FOR NEXT SESSION: Progress as per hip labral repair protocol   Donnel Saxon Tanika Bracco, PTA 04/24/2022, 3:24 PM   Check all possible CPT codes: 47829 - PT Re-evaluation, 97110- Therapeutic Exercise, [phone] - Gait Training, 97140 - Manual Therapy, 97530 - Therapeutic Activities, 97535 - Self Care, 97014 - Electrical stimulation (unattended), [id number] - Electrical stimulation (Manual), [id number] - Iontophoresis, [id number] - Ultrasound, [id number] - Orthotic Fit, [id number] - Physical performance training, and [id number] - Aquatic therapy    Check all conditions that are expected to impact treatment: None of these apply   If treatment provided at initial evaluation, no treatment charged due to lack of authorization.

## 2022-04-25 ENCOUNTER — Ambulatory Visit (INDEPENDENT_AMBULATORY_CARE_PROVIDER_SITE_OTHER): Payer: Medicaid Other | Admitting: Orthopaedic Surgery

## 2022-04-25 DIAGNOSIS — M24052 Loose body in left hip: Secondary | ICD-10-CM

## 2022-04-25 NOTE — Progress Notes (Signed)
Post Operative Evaluation    Procedure/Date of Surgery: Left hip arthroscopy with labral repair and removal of loose body 03/18/22  Interval History:   Presents today 6 weeks status post above procedure.  Overall he is doing extremely well.  At this time he has little to no pain.  He does feel like he has turned the corner.  He is still having some weakness although this is continuing to improve.  PMH/PSH/Family History/Social History/Meds/Allergies:   No past medical history on file. Past Surgical History:  Procedure Laterality Date   APPENDECTOMY     FLEXOR TENDON REPAIR Left 03/07/2019   Procedure: LEFT THUMB FLEXOR TENDON REPAIR;  Surgeon: Milly Jakob, MD;  Location: [place];  Service: Orthopedics;  Laterality: Left;   HIP ARTHROSCOPY Left 03/18/2022   Procedure: LEFT HIP ARTHROSCOPY WITH LOOSE BODY REMOVAL AND LABRAL REPAIR;  Surgeon: Vanetta Mulders, MD;  Location: [place];  Service: Orthopedics;  Laterality: Left;   IR FLUORO GUIDE CV LINE RIGHT  02/07/2021   IR US GUIDE VASC ACCESS RIGHT  02/07/2021   NERVE REPAIR Left 03/07/2019   Procedure: LEFT HAND NERVE REPAIR(S);  Surgeon: Milly Jakob, MD;  Location: [place];  Service: Orthopedics;  Laterality: Left;   Social History   Socioeconomic History   Marital status: Married    Spouse name: Not on file   Number of children: Not on file   Years of education: Not on file   Highest education level: Not on file  Occupational History   Not on file  Tobacco Use   Smoking status: Every Day    Packs/day: .25    Types: Cigarettes   Smokeless tobacco: Never  Substance and Sexual Activity   Alcohol use: Yes    Comment: occ   Drug use: Yes    Types: Marijuana   Sexual activity: Not on file  Other Topics Concern   Not on file  Social History Narrative   Not on file   Social Determinants of Health   Financial Resource Strain: Not on file  Food  Insecurity: Not on file  Transportation Needs: Not on file  Physical Activity: Not on file  Stress: Not on file  Social Connections: Not on file   No family history on file. Allergies  Allergen Reactions   Latex Rash   Current Outpatient Medications  Medication Sig Dispense Refill   amLODipine (NORVASC) 10 MG tablet Take 1 tablet (10 mg total) by mouth daily. (Patient not taking: Reported on 02/02/2022) 30 tablet 2   aspirin EC 325 MG tablet Take 1 tablet (325 mg total) by mouth daily. (Patient not taking: Reported on 03/17/2022) 30 tablet 0   carvedilol (COREG) 25 MG tablet Take 1 tablet (25 mg total) by mouth 2 (two) times daily with a meal. (Patient not taking: Reported on 02/02/2022) 60 tablet 2   cyclobenzaprine (FLEXERIL) 10 MG tablet Take 1 tablet (10 mg total) by mouth 2 (two) times daily as needed for muscle spasms. (Patient not taking: Reported on 02/02/2022) 60 tablet 1   gabapentin (NEURONTIN) 300 MG capsule Take 1 capsule (300 mg total) by mouth 3 (three) times daily as needed. (Patient not taking: Reported on 02/02/2022) 90 capsule 3   hydrALAZINE (APRESOLINE) 100 MG tablet Take 0.5 tablets (50 mg total) by mouth  every 12 (twelve) hours. (Patient not taking: Reported on 02/02/2022) 60 tablet 2   ondansetron (ZOFRAN) 4 MG tablet Take 1 tablet (4 mg total) by mouth every 6 (six) hours. (Patient not taking: Reported on 03/17/2022) 12 tablet 0   ondansetron (ZOFRAN) 4 MG tablet Take 1 tablet (4 mg total) by mouth every 6 (six) hours. (Patient not taking: Reported on 03/17/2022) 12 tablet 0   oxyCODONE (OXY IR/ROXICODONE) 5 MG immediate release tablet Take 1 tablet (5 mg total) by mouth every 4 (four) hours as needed (severe pain). (Patient not taking: Reported on 03/17/2022) 20 tablet 0   sevelamer carbonate (RENVELA) 800 MG tablet Take 3 tablets (2,400 mg total) by mouth 3 (three) times daily with meals. (Patient not taking: Reported on 02/02/2022) 60 tablet 0   topiramate (TOPAMAX) 50 MG  tablet Take 1 tablet (50 mg total) by mouth daily. (Patient not taking: Reported on 02/02/2022) 90 tablet 3   No current facility-administered medications for this visit.   No results found.  Review of Systems:   A ROS was performed including pertinent positives and negatives as documented in the HPI.   Musculoskeletal Exam:    There were no vitals taken for this visit.  Left hip incisions are well-appearing without erythema or drainage.  He has 120 degrees of flexion of the left hip without pain.  He has 30 degrees of internal and external rotation about the hip minimal pain.  Some weakness with resisted hip flexion  Imaging:      I personally reviewed and interpreted the radiographs.   Assessment:   6-week status post left hip arthroscopy with labral repair and removal of loose body.  Overall he is doing extremely well.  I did describe that I do believe he would benefit from a few additional physical therapy sessions with a transition to a home program.  I will plan to keep him out of work for an additional 2 weeks and he may return at that point  Plan :    -Return to clinic in 6 weeks for reassessment      I personally saw and evaluated the patient, and participated in the management and treatment plan.  Vanetta Mulders, MD Attending Physician, Orthopedic Surgery  This document was dictated using Dragon voice recognition software. A reasonable attempt at proof reading has been made to minimize errors.

## 2022-04-28 ENCOUNTER — Ambulatory Visit (HOSPITAL_BASED_OUTPATIENT_CLINIC_OR_DEPARTMENT_OTHER): Payer: Medicaid Other

## 2022-05-01 ENCOUNTER — Encounter (HOSPITAL_BASED_OUTPATIENT_CLINIC_OR_DEPARTMENT_OTHER): Payer: Medicaid Other

## 2022-05-12 ENCOUNTER — Encounter (HOSPITAL_BASED_OUTPATIENT_CLINIC_OR_DEPARTMENT_OTHER): Payer: Medicaid Other | Admitting: Physical Therapy

## 2022-05-14 ENCOUNTER — Encounter (HOSPITAL_BASED_OUTPATIENT_CLINIC_OR_DEPARTMENT_OTHER): Payer: Medicaid Other | Admitting: Physical Therapy

## 2022-05-19 ENCOUNTER — Other Ambulatory Visit: Payer: Self-pay

## 2022-05-30 ENCOUNTER — Encounter (HOSPITAL_BASED_OUTPATIENT_CLINIC_OR_DEPARTMENT_OTHER): Payer: Medicaid Other | Admitting: Orthopaedic Surgery

## 2022-07-21 ENCOUNTER — Telehealth: Payer: Self-pay | Admitting: Orthopaedic Surgery

## 2022-07-21 NOTE — Telephone Encounter (Signed)
Patient's wife Zella Ball called advised patient is experiencing a great deal of pain,burning, popping and stiffness in his left hip. She advised patient is having a hard time walking. Zella Ball said the pain has gotten worse over a 3 week period. The number to contact Zella Ball is [phone]

## 2022-07-21 NOTE — Telephone Encounter (Signed)
Called patient and scheduled him 06/14 for reeval   He Started work may 5th, stands on concrete, lifting 50-60lbs and has had Increased stiffness, pain and he states by the end of the day he has difficulty bearing weight on surgical side.

## 2022-07-25 ENCOUNTER — Ambulatory Visit (INDEPENDENT_AMBULATORY_CARE_PROVIDER_SITE_OTHER): Payer: Medicaid Other | Admitting: Orthopaedic Surgery

## 2022-07-25 DIAGNOSIS — M25552 Pain in left hip: Secondary | ICD-10-CM | POA: Diagnosis not present

## 2022-07-25 DIAGNOSIS — M24052 Loose body in left hip: Secondary | ICD-10-CM | POA: Diagnosis not present

## 2022-07-25 MED ORDER — LIDOCAINE HCL 1 % IJ SOLN
4.0000 mL | INTRAMUSCULAR | Status: AC | PRN
  Administered 2022-07-25: 4 mL

## 2022-07-25 MED ORDER — TRIAMCINOLONE ACETONIDE 40 MG/ML IJ SUSP
80.0000 mg | INTRAMUSCULAR | Status: AC | PRN
  Administered 2022-07-25: 80 mg via INTRA_ARTICULAR

## 2022-07-25 NOTE — Progress Notes (Signed)
Post Operative Evaluation    Procedure/Date of Surgery: Left hip arthroscopy with labral repair and removal of loose body 03/18/22  Interval History:   Presents today status post the above procedure.  He states that he recently has been back to work and has been on his legs for 12 hours a day for which she is experiencing persistent at base pain.  This has been more progressive over the last several weeks.  Is back to the point where he is walking with a limp.  PMH/PSH/Family History/Social History/Meds/Allergies:   No past medical history on file. Past Surgical History:  Procedure Laterality Date  . APPENDECTOMY    . FLEXOR TENDON REPAIR Left 03/07/2019   Procedure: LEFT THUMB FLEXOR TENDON REPAIR;  Surgeon: Mack Hook, MD;  Location: [facility];  Service: Orthopedics;  Laterality: Left;  . HIP ARTHROSCOPY Left 03/18/2022   Procedure: LEFT HIP ARTHROSCOPY WITH LOOSE BODY REMOVAL AND LABRAL REPAIR;  Surgeon: Huel Cote, MD;  Location[facility];  Service: Orthopedics;  Laterality: Left;  . IR FLUORO GUIDE CV LINE RIGHT  02/07/2021  . IR US GUIDE VASC ACCESS RIGHT  02/07/2021  . NERVE REPAIR Left 03/07/2019   Procedure: LEFT HAND NERVE REPAIR(S);  Surgeon: Mack Hook, MD;  Location: [facility];  Service: Orthopedics;  Laterality: Left;   Social History   Socioeconomic History  . Marital status: Married    Spouse name: Not on file  . Number of children: Not on file  . Years of education: Not on file  . Highest education level: Not on file  Occupational History  . Not on file  Tobacco Use  . Smoking status: Every Day    Packs/day: .25    Types: Cigarettes  . Smokeless tobacco: Never  Substance and Sexual Activity  . Alcohol use: Yes    Comment: occ  . Drug use: Yes    Types: Marijuana  . Sexual activity: Not on file  Other Topics Concern  . Not on file  Social History Narrative  . Not on file    Social Determinants of Health   Financial Resource Strain: Not on file  Food Insecurity: Not on file  Transportation Needs: Not on file  Physical Activity: Not on file  Stress: Not on file  Social Connections: Not on file   No family history on file. Allergies  Allergen Reactions  . Latex Rash   Current Outpatient Medications  Medication Sig Dispense Refill  . amLODipine (NORVASC) 10 MG tablet Take 1 tablet (10 mg total) by mouth daily. (Patient not taking: Reported on 02/02/2022) 30 tablet 2  . aspirin EC 325 MG tablet Take 1 tablet (325 mg total) by mouth daily. (Patient not taking: Reported on 03/17/2022) 30 tablet 0  . carvedilol (COREG) 25 MG tablet Take 1 tablet (25 mg total) by mouth 2 (two) times daily with a meal. (Patient not taking: Reported on 02/02/2022) 60 tablet 2  . cyclobenzaprine (FLEXERIL) 10 MG tablet Take 1 tablet (10 mg total) by mouth 2 (two) times daily as needed for muscle spasms. (Patient not taking: Reported on 02/02/2022) 60 tablet 1  . gabapentin (NEURONTIN) 300 MG capsule Take 1 capsule (300 mg total) by mouth 3 (three) times daily as needed. (Patient not taking: Reported on 02/02/2022) 90 capsule 3  .  hydrALAZINE (APRESOLINE) 100 MG tablet Take 0.5 tablets (50 mg total) by mouth every 12 (twelve) hours. (Patient not taking: Reported on 02/02/2022) 60 tablet 2  . ondansetron (ZOFRAN) 4 MG tablet Take 1 tablet (4 mg total) by mouth every 6 (six) hours. (Patient not taking: Reported on 03/17/2022) 12 tablet 0  . ondansetron (ZOFRAN) 4 MG tablet Take 1 tablet (4 mg total) by mouth every 6 (six) hours. (Patient not taking: Reported on 03/17/2022) 12 tablet 0  . oxyCODONE (OXY IR/ROXICODONE) 5 MG immediate release tablet Take 1 tablet (5 mg total) by mouth every 4 (four) hours as needed (severe pain). (Patient not taking: Reported on 03/17/2022) 20 tablet 0  . sevelamer carbonate (RENVELA) 800 MG tablet Take 3 tablets (2,400 mg total) by mouth 3 (three) times daily with  meals. (Patient not taking: Reported on 02/02/2022) 60 tablet 0  . topiramate (TOPAMAX) 50 MG tablet Take 1 tablet (50 mg total) by mouth daily. (Patient not taking: Reported on 02/02/2022) 90 tablet 3   No current facility-administered medications for this visit.   No results found.  Review of Systems:   A ROS was performed including pertinent positives and negatives as documented in the HPI.   Musculoskeletal Exam:    There were no vitals taken for this visit.  Left hip incisions are well-appearing without erythema or drainage.  He has 120 degrees of flexion of the left hip without pain.  He has 30 degrees of internal and external rotation about the hip minimal pain.  Some weakness with resisted hip flexion  Imaging:      I personally reviewed and interpreted the radiographs.   Assessment:   Status post left hip arthroscopy with labral repair and removal of loose body.  At this time he is having some increased pain at today's visit.  I do believe that this is consistent with him returning back to work at an early time point.  Given this I would like him to refrain from work for an additional 3 weeks.  We will plan to perform an ultrasound-guided femoral acetabular injection.  I will see him back in 2 months for reassessment.  During this time off I would like him to reengage with physical therapy to work on his strengthening of the hip. Plan :    -Left hip ultrasound-guided femoral acetabular injection provided after verbal consent obtained -Return to clinic in 2 months     Procedure Note  Patient: Erik Griffith             Date of Birth: 11-09-1985           MRN: [id number]             Visit Date: 07/25/2022  Procedures: Visit Diagnoses:  1. Loose body in left hip     Large Joint Inj: L hip joint on 07/25/2022 11:13 AM Indications: pain Details: 22 G 3.5 in needle, ultrasound-guided anterolateral approach  Arthrogram: No  Medications: 4 mL lidocaine 1 %; 80 mg  triamcinolone acetonide 40 MG/ML Outcome: tolerated well, no immediate complications Procedure, treatment alternatives, risks and benefits explained, specific risks discussed. Consent was given by the patient. Immediately prior to procedure a time out was called to verify the correct patient, procedure, equipment, support staff and site/side marked as required. Patient was prepped and draped in the usual sterile fashion.        I personally saw and evaluated the patient, and participated in the management and treatment plan.  Huel Cote,  MD Attending Physician, Orthopedic Surgery  This document was dictated using Dragon voice recognition software. A reasonable attempt at proof reading has been made to minimize errors.

## 2022-08-18 ENCOUNTER — Telehealth (HOSPITAL_BASED_OUTPATIENT_CLINIC_OR_DEPARTMENT_OTHER): Payer: Self-pay | Admitting: Orthopaedic Surgery

## 2022-08-18 ENCOUNTER — Encounter (HOSPITAL_BASED_OUTPATIENT_CLINIC_OR_DEPARTMENT_OTHER): Payer: Self-pay | Admitting: Orthopaedic Surgery

## 2022-08-18 NOTE — Telephone Encounter (Signed)
Erik Griffith  needs a different  return to work note with restrictions. Please put in his mychart  you can use the same form from Friday just take off restrictions.

## 2022-08-18 NOTE — Telephone Encounter (Signed)
Disregard FIRST note. Devlon needs a work note saying WITHOUT RESTRICTION IT CAN BE SAME NOTE JUST REMOVE THE RESTRICTIONS.  PLEASE SEND TO HIS [facility]

## 2022-09-24 ENCOUNTER — Ambulatory Visit (HOSPITAL_BASED_OUTPATIENT_CLINIC_OR_DEPARTMENT_OTHER): Payer: Medicaid Other | Admitting: Orthopaedic Surgery

## 2023-07-24 ENCOUNTER — Encounter (HOSPITAL_COMMUNITY): Payer: Self-pay

## 2023-07-24 ENCOUNTER — Ambulatory Visit (HOSPITAL_COMMUNITY)
Admission: EM | Admit: 2023-07-24 | Discharge: 2023-07-24 | Disposition: A | Discharge status: Home / Self Care | Attending: Family Medicine | Admitting: Family Medicine

## 2023-07-24 DIAGNOSIS — R369 Urethral discharge, unspecified: Secondary | ICD-10-CM | POA: Insufficient documentation

## 2023-07-24 DIAGNOSIS — A64 Unspecified sexually transmitted disease: Secondary | ICD-10-CM | POA: Insufficient documentation

## 2023-07-24 LAB — HIV ANTIBODY (ROUTINE TESTING W REFLEX): HIV Screen 4th Generation wRfx: NONREACTIVE

## 2023-07-24 MED ORDER — DOXYCYCLINE HYCLATE 100 MG PO CAPS: 100.0000 mg | ORAL_CAPSULE | Freq: Two times a day (BID) | ORAL | 0 refills | Status: AC

## 2023-07-24 MED ORDER — CEFTRIAXONE SODIUM 500 MG IJ SOLR: 500.0000 mg | Freq: Once | INTRAMUSCULAR | Status: AC

## 2023-07-24 MED ORDER — LIDOCAINE HCL (PF) 1 % IJ SOLN
INTRAMUSCULAR | Status: AC
  Filled 2023-07-24: qty 2

## 2023-07-24 MED ORDER — CEFTRIAXONE SODIUM 500 MG IJ SOLR
INTRAMUSCULAR | Status: AC
  Filled 2023-07-24: qty 500

## 2023-07-24 NOTE — Discharge Instructions (Addendum)
 You were treated today for gonorrhea with a shot of Rocephin.  You are prescribed doxycycline 1 tablet twice daily for 1 week for the treatment of chlamydia.  Results will take 2 to 3 days we will contact you if there is a positive result that requires treatment.  Follow-up with your primary care doctor or return if your symptoms fail to improve.  Recommend safe sex practices.

## 2023-07-24 NOTE — ED Provider Notes (Signed)
 [facility]    CSN: [id number] Arrival date & time: 07/24/23  1024      History   Chief Complaint Chief Complaint  Patient presents with   Penile Discharge    HPI Erik Griffith is a 38 y.o. male.   The history is provided by the patient.  Penile Discharge  Clear penile discharge and slight dysuria x 1 week onset after unprotected sex with a new partner.  Denies genital rash, testicular pain or swelling, fever, chills, frequency, urgency, hematuria.  Allergy to latex.  History reviewed. No pertinent past medical history.  Patient Active Problem List   Diagnosis Date Noted   Loose body in left hip 03/18/2022   Tear of left acetabular labrum 03/18/2022   Left cervical radiculopathy 06/11/2021   Hypocalcemia 02/07/2021   Non-traumatic rhabdomyolysis 02/07/2021   Acute renal failure (HCC) 02/07/2021   Hypertension 02/07/2021   Weakness generalized 02/07/2021   Paresthesia 02/07/2021   Acute kidney injury (AKI) with acute tubular necrosis (ATN) (HCC) 02/06/2021   Neck pain 01/13/2019   Neck strain, initial encounter 01/13/2019   Back spasm 01/13/2019   Lymphadenitis 01/13/2019    Past Surgical History:  Procedure Laterality Date   APPENDECTOMY     FLEXOR TENDON REPAIR Left 03/07/2019   Procedure: LEFT THUMB FLEXOR TENDON REPAIR;  Surgeon: Rober Chimera, MD;  Location: [facility];  Service: Orthopedics;  Laterality: Left;   HIP ARTHROSCOPY Left 03/18/2022   Procedure: LEFT HIP ARTHROSCOPY WITH LOOSE BODY REMOVAL AND LABRAL REPAIR;  Surgeon: Wilhelmenia Harada, MD;  Location: [facility];  Service: Orthopedics;  Laterality: Left;   IR FLUORO GUIDE CV LINE RIGHT  02/07/2021   IR US  GUIDE VASC ACCESS RIGHT  02/07/2021   NERVE REPAIR Left 03/07/2019   Procedure: LEFT HAND NERVE REPAIR(S);  Surgeon: Rober Chimera, MD;  Location: [facility];  Service: Orthopedics;  Laterality: Left;       Home Medications    Prior to Admission  medications   Medication Sig Start Date End Date Taking? Authorizing Provider  doxycycline (VIBRAMYCIN) 100 MG capsule Take 1 capsule (100 mg total) by mouth 2 (two) times daily for 7 days. 07/24/23 07/31/23 Yes Kinslee Dalpe, PA  amLODipine  (NORVASC ) 10 MG tablet Take 1 tablet (10 mg total) by mouth daily. Patient not taking: Reported on 02/02/2022 02/18/21   Burton Casey, MD  aspirin  EC 325 MG tablet Take 1 tablet (325 mg total) by mouth daily. Patient not taking: Reported on 03/17/2022 03/13/22   Wilhelmenia Harada, MD  carvedilol  (COREG ) 25 MG tablet Take 1 tablet (25 mg total) by mouth 2 (two) times daily with a meal. Patient not taking: Reported on 02/02/2022 02/17/21   Burton Casey, MD  cyclobenzaprine  (FLEXERIL ) 10 MG tablet Take 1 tablet (10 mg total) by mouth 2 (two) times daily as needed for muscle spasms. Patient not taking: Reported on 02/02/2022 07/22/21   Syliva Even, MD  gabapentin  (NEURONTIN ) 300 MG capsule Take 1 capsule (300 mg total) by mouth 3 (three) times daily as needed. Patient not taking: Reported on 02/02/2022 06/10/21   Syliva Even, MD  hydrALAZINE  (APRESOLINE ) 100 MG tablet Take 0.5 tablets (50 mg total) by mouth every 12 (twelve) hours. Patient not taking: Reported on 02/02/2022 02/17/21   Burton Casey, MD  ondansetron  (ZOFRAN ) 4 MG tablet Take 1 tablet (4 mg total) by mouth every 6 (six) hours. Patient not taking: Reported on 03/17/2022 02/02/22   Volney Grumbles,  PA-C  ondansetron  (ZOFRAN ) 4 MG tablet Take 1 tablet (4 mg total) by mouth every 6 (six) hours. Patient not taking: Reported on 03/17/2022 02/02/22   Volney Grumbles, PA-C  oxyCODONE  (OXY IR/ROXICODONE ) 5 MG immediate release tablet Take 1 tablet (5 mg total) by mouth every 4 (four) hours as needed (severe pain). Patient not taking: Reported on 03/17/2022 03/13/22   Wilhelmenia Harada, MD  sevelamer  carbonate (RENVELA ) 800 MG tablet Take 3 tablets (2,400 mg total) by mouth 3 (three) times daily with  meals. Patient not taking: Reported on 02/02/2022 02/17/21   Burton Casey, MD  topiramate  (TOPAMAX ) 50 MG tablet Take 1 tablet (50 mg total) by mouth daily. Patient not taking: Reported on 02/02/2022 08/26/21   Syliva Even, MD    Family History History reviewed. No pertinent family history.  Social History Social History   Tobacco Use   Smoking status: Every Day    Current packs/day: 0.25    Types: Cigarettes   Smokeless tobacco: Never  Substance Use Topics   Alcohol use: Yes    Comment: occ   Drug use: Yes    Types: Marijuana     Allergies   Latex   Review of Systems Review of Systems  Constitutional:  Negative for fatigue and fever.  Genitourinary:  Positive for dysuria and penile discharge. Negative for difficulty urinating, flank pain, frequency, penile pain, penile swelling and testicular pain.     Physical Exam Triage Vital Signs ED Triage Vitals  Encounter Vitals Group     BP 07/24/23 1132 (!) 146/97     Girls Systolic BP Percentile --      Girls Diastolic BP Percentile --      Boys Systolic BP Percentile --      Boys Diastolic BP Percentile --      Pulse Rate 07/24/23 1132 83     Resp 07/24/23 1132 16     Temp 07/24/23 1132 98.3 F (36.8 C)     Temp Source 07/24/23 1132 Oral     SpO2 07/24/23 1132 97 %     Weight 07/24/23 1131 206 lb (93.4 kg)     Height 07/24/23 1131 5' 11 (1.803 m)     Head Circumference --      Peak Flow --      Pain Score 07/24/23 1131 0     Pain Loc --      Pain Education --      Exclude from Growth Chart --    No data found.  Updated Vital Signs BP (!) 146/97 (BP Location: Left Arm)   Pulse 83   Temp 98.3 F (36.8 C) (Oral)   Resp 16   Ht 5' 11 (1.803 m)   Wt 206 lb (93.4 kg)   SpO2 97%   BMI 28.73 kg/m   Visual Acuity Right Eye Distance:   Left Eye Distance:   Bilateral Distance:    Right Eye Near:   Left Eye Near:    Bilateral Near:     Physical Exam   UC Treatments / Results  Labs (all  labs ordered are listed, but only abnormal results are displayed) Labs Reviewed  RPR  HIV ANTIBODY (ROUTINE TESTING W REFLEX)  CYTOLOGY, (ORAL, ANAL, URETHRAL) ANCILLARY ONLY    EKG   Radiology No results found.  Procedures Procedures (including critical care time)  Medications Ordered in UC Medications  cefTRIAXone (ROCEPHIN) injection 500 mg (has no administration in time range)    Initial Impression /  Assessment and Plan / UC Course  I have reviewed the triage vital signs and the nursing notes.  Pertinent labs & imaging results that were available during my care of the patient were reviewed by me and considered in my medical decision making (see chart for details).     38 year old male with penile discharge and dysuria after unprotected sex with a new partner would like to be tested and treated for STIs.  Will treat for chlamydia and gonorrhea will withhold treatment for other STIs pending results of testing.  He was given Rocephin in the office and prescription for doxycycline, he was instructed to practice safe sex practices and follow-up if his symptoms fail to improve Final Clinical Impressions(s) / UC Diagnoses   Final diagnoses:  Penile discharge  STI (sexually transmitted infection)     Discharge Instructions      You were treated today for gonorrhea with a shot of Rocephin.  You are prescribed doxycycline 1 tablet twice daily for 1 week for the treatment of chlamydia.  Results will take 2 to 3 days we will contact you if there is a positive result that requires treatment.  Follow-up with your primary care doctor or return if your symptoms fail to improve.  Recommend safe sex practices.    ED Prescriptions     Medication Sig Dispense Auth. Provider   doxycycline (VIBRAMYCIN) 100 MG capsule Take 1 capsule (100 mg total) by mouth 2 (two) times daily for 7 days. 14 capsule Gill Delrossi, [place]      PDMP not reviewed this encounter.   Elvie Palomo,  [place] 07/24/23 1201

## 2023-07-24 NOTE — ED Triage Notes (Addendum)
 Patient presenting with slight penile irritation and discharge after urination onset 1 week ago. States had a new partner recently.  Prescriptions or OTC medications tried: No

## 2023-07-25 LAB — RPR: RPR Ser Ql: NONREACTIVE

## 2023-07-27 LAB — CYTOLOGY, (ORAL, ANAL, URETHRAL) ANCILLARY ONLY
Chlamydia: NEGATIVE
Comment: NEGATIVE
Comment: NEGATIVE
Comment: NORMAL
Neisseria Gonorrhea: NEGATIVE
Trichomonas: NEGATIVE

## 2023-12-01 ENCOUNTER — Other Ambulatory Visit: Payer: Self-pay

## 2023-12-01 ENCOUNTER — Emergency Department (HOSPITAL_BASED_OUTPATIENT_CLINIC_OR_DEPARTMENT_OTHER)
Admission: EM | Admit: 2023-12-01 | Discharge: 2023-12-01 | Disposition: A | Discharge status: Home / Self Care | Payer: Self-pay

## 2023-12-01 DIAGNOSIS — Z91040 Latex allergy status: Secondary | ICD-10-CM | POA: Insufficient documentation

## 2023-12-01 DIAGNOSIS — Z79899 Other long term (current) drug therapy: Secondary | ICD-10-CM | POA: Insufficient documentation

## 2023-12-01 DIAGNOSIS — Z7982 Long term (current) use of aspirin: Secondary | ICD-10-CM | POA: Insufficient documentation

## 2023-12-01 DIAGNOSIS — I10 Essential (primary) hypertension: Secondary | ICD-10-CM | POA: Insufficient documentation

## 2023-12-01 DIAGNOSIS — M5441 Lumbago with sciatica, right side: Secondary | ICD-10-CM | POA: Insufficient documentation

## 2023-12-01 LAB — BASIC METABOLIC PANEL WITH GFR
Anion gap: 12 (ref 5–15)
BUN: 16 mg/dL (ref 6–20)
CO2: 24 mmol/L (ref 22–32)
Calcium: 9.9 mg/dL (ref 8.9–10.3)
Chloride: 105 mmol/L (ref 98–111)
Creatinine, Ser: 0.9 mg/dL (ref 0.61–1.24)
GFR, Estimated: >60 mL/min (ref >60)
Glucose, Bld: 90 mg/dL (ref 70–99)
Potassium: 4.1 mmol/L (ref 3.5–5.1)
Sodium: 141 mmol/L (ref 135–145)

## 2023-12-01 LAB — CBC WITH DIFFERENTIAL/PLATELET
Abs Immature Granulocytes: 0.01 K/uL (ref 0.00–0.07)
Basophils Absolute: 0 K/uL (ref 0.0–0.1)
Basophils Relative: 1 %
Eosinophils Absolute: 0 K/uL (ref 0.0–0.5)
Eosinophils Relative: 1 %
HCT: 41.8 % (ref 39.0–52.0)
Hemoglobin: 13.9 g/dL (ref 13.0–17.0)
Immature Granulocytes: 0 %
Lymphocytes Relative: 52 %
Lymphs Abs: 1.9 K/uL (ref 0.7–4.0)
MCH: 30.5 pg (ref 26.0–34.0)
MCHC: 33.3 g/dL (ref 30.0–36.0)
MCV: 91.7 fL (ref 80.0–100.0)
Monocytes Absolute: 0.4 K/uL (ref 0.1–1.0)
Monocytes Relative: 11 %
Neutro Abs: 1.2 K/uL — ABNORMAL LOW (ref 1.7–7.7)
Neutrophils Relative %: 35 %
Platelets: 284 K/uL (ref 150–400)
RBC: 4.56 MIL/uL (ref 4.22–5.81)
RDW: 13.2 % (ref 11.5–15.5)
WBC: 3.6 K/uL — ABNORMAL LOW (ref 4.0–10.5)
nRBC: 0 % (ref 0.0–0.2)

## 2023-12-01 MED ORDER — METHOCARBAMOL 500 MG PO TABS: 500.0000 mg | ORAL_TABLET | Freq: Two times a day (BID) | ORAL | 0 refills | Status: AC

## 2023-12-01 MED ORDER — KETOROLAC TROMETHAMINE 15 MG/ML IJ SOLN
15.0000 mg | Freq: Once | INTRAMUSCULAR | Status: AC
  Administered 2023-12-01: 15 mg via INTRAVENOUS

## 2023-12-01 MED ORDER — HYDROCODONE-ACETAMINOPHEN 5-325 MG PO TABS: 1.0000 | ORAL_TABLET | Freq: Four times a day (QID) | ORAL | 0 refills | Status: AC | PRN

## 2023-12-01 MED ORDER — METHOCARBAMOL 500 MG PO TABS
500.0000 mg | ORAL_TABLET | Freq: Once | ORAL | Status: AC
  Administered 2023-12-01: 500 mg via ORAL
  Filled 2023-12-01: qty 1

## 2023-12-01 MED ORDER — NAPROXEN 500 MG PO TABS: 500.0000 mg | ORAL_TABLET | Freq: Two times a day (BID) | ORAL | 0 refills | Status: AC

## 2023-12-01 MED ORDER — HYDROCODONE-ACETAMINOPHEN 5-325 MG PO TABS
1.0000 | ORAL_TABLET | Freq: Once | ORAL | Status: AC
  Administered 2023-12-01: 1 via ORAL
  Filled 2023-12-01: qty 1

## 2023-12-01 MED ORDER — KETOROLAC TROMETHAMINE 15 MG/ML IJ SOLN
15.0000 mg | Freq: Once | INTRAMUSCULAR | Status: DC
  Filled 2023-12-01: qty 1

## 2023-12-01 MED ORDER — DEXAMETHASONE SOD PHOSPHATE PF 10 MG/ML IJ SOLN
10.0000 mg | Freq: Once | INTRAMUSCULAR | Status: AC
  Administered 2023-12-01: 10 mg via INTRAVENOUS

## 2023-12-01 NOTE — Discharge Instructions (Signed)
 It was a pleasure taking care of you today.  As discussed, your workup was reassuring.  Your kidney function was normal.  I am sending you home with pain medication and a muscle relaxer.  Take as needed for pain.  Pain medication and muscle relaxer can cause drowsiness so do not drive or operate machinery while on the medication.  I have included low back exercises.  Perform daily.  You may also purchase over-the-counter Lidoderm  patches and Voltaren gel for added pain relief.  Please follow-up with PCP if symptoms do not improve over the next week.  Return to the ER for any worsening symptoms.

## 2023-12-01 NOTE — ED Provider Notes (Signed)
 [place] EMERGENCY DEPARTMENT AT [facility] Provider Note   CSN: [id number] Arrival date & time: 12/01/23  9058     Patient presents with: Back Pain   [name] is a 38 y.o. male with a past medical history significant for hypertension who presents to the ED due to bilateral low back pain that started yesterday while at work.  Patient states he lifts heavy things at work typically above 50 pounds.  Notes intermittent shooting pain in his lower lumbar region.  Pain occasionally radiates down left lower extremity.  Denies saddle anesthesia, bowel/bladder incontinence, lower extremity numbness/tingling, lower extremity weakness.  Denies history of IV drug use.  No fever or chills.  No history of cancer.  Notes he has had intermittent back problems for a while.  No direct injury to low back.  Denies any urinary symptoms.  No abdominal pain.  Patient states this does feel similar to when he had an AKI in the past.  History obtained from patient and past medical records. No interpreter used during encounter.      Prior to Admission medications   Medication Sig Start Date End Date Taking? Authorizing Provider  HYDROcodone -acetaminophen  (NORCO/VICODIN) 5-325 MG tablet Take 1 tablet by mouth every 6 (six) hours as needed. 12/01/23  Yes Ozella Comins, Aleck BROCKS, PA-C  methocarbamol  (ROBAXIN ) 500 MG tablet Take 1 tablet (500 mg total) by mouth 2 (two) times daily. 12/01/23  Yes Adelyne Marchese C, PA-C  naproxen  (NAPROSYN ) 500 MG tablet Take 1 tablet (500 mg total) by mouth 2 (two) times daily. 12/01/23  Yes Avalene Sealy, Aleck BROCKS, PA-C  amLODipine  (NORVASC ) 10 MG tablet Take 1 tablet (10 mg total) by mouth daily. Patient not taking: Reported on 02/02/2022 02/18/21   Raenelle Donalda HERO, MD  aspirin  EC 325 MG tablet Take 1 tablet (325 mg total) by mouth daily. Patient not taking: Reported on 03/17/2022 03/13/22   Genelle Standing, MD  carvedilol  (COREG ) 25 MG tablet Take 1 tablet (25 mg total) by  mouth 2 (two) times daily with a meal. Patient not taking: Reported on 02/02/2022 02/17/21   Raenelle Donalda HERO, MD  cyclobenzaprine  (FLEXERIL ) 10 MG tablet Take 1 tablet (10 mg total) by mouth 2 (two) times daily as needed for muscle spasms. Patient not taking: Reported on 02/02/2022 07/22/21   Joane Artist RAMAN, MD  gabapentin  (NEURONTIN ) 300 MG capsule Take 1 capsule (300 mg total) by mouth 3 (three) times daily as needed. Patient not taking: Reported on 02/02/2022 06/10/21   Joane Artist RAMAN, MD  hydrALAZINE  (APRESOLINE ) 100 MG tablet Take 0.5 tablets (50 mg total) by mouth every 12 (twelve) hours. Patient not taking: Reported on 02/02/2022 02/17/21   Raenelle Donalda HERO, MD  ondansetron  (ZOFRAN ) 4 MG tablet Take 1 tablet (4 mg total) by mouth every 6 (six) hours. Patient not taking: Reported on 03/17/2022 02/02/22   Waylan Elsie PARAS, PA-C  ondansetron  (ZOFRAN ) 4 MG tablet Take 1 tablet (4 mg total) by mouth every 6 (six) hours. Patient not taking: Reported on 03/17/2022 02/02/22   Waylan Elsie PARAS, PA-C  oxyCODONE  (OXY IR/ROXICODONE ) 5 MG immediate release tablet Take 1 tablet (5 mg total) by mouth every 4 (four) hours as needed (severe pain). Patient not taking: Reported on 03/17/2022 03/13/22   Genelle Standing, MD  sevelamer  carbonate (RENVELA ) 800 MG tablet Take 3 tablets (2,400 mg total) by mouth 3 (three) times daily with meals. Patient not taking: Reported on 02/02/2022 02/17/21   Raenelle Donalda HERO, MD  topiramate  (TOPAMAX )  50 MG tablet Take 1 tablet (50 mg total) by mouth daily. Patient not taking: Reported on 02/02/2022 08/26/21   Joane Artist RAMAN, MD    Allergies: Latex    Review of Systems  Respiratory:  Negative for shortness of breath.   Cardiovascular:  Negative for chest pain.  Gastrointestinal:  Negative for abdominal pain.  Genitourinary:  Negative for dysuria and flank pain.  Musculoskeletal:  Positive for back pain. Negative for gait problem.    Updated Vital Signs BP (!) 127/100 (BP  Location: Right Arm)   Pulse 85   Temp 98.1 F (36.7 C) (Oral)   Resp 16   SpO2 99%   Physical Exam Vitals and nursing note reviewed.  Constitutional:      General: He is not in acute distress.    Appearance: He is not ill-appearing.  HENT:     Head: Normocephalic.  Eyes:     Pupils: Pupils are equal, round, and reactive to light.  Cardiovascular:     Rate and Rhythm: Normal rate and regular rhythm.     Pulses: Normal pulses.     Heart sounds: Normal heart sounds. No murmur heard.    No friction rub. No gallop.  Pulmonary:     Effort: Pulmonary effort is normal.     Breath sounds: Normal breath sounds.  Abdominal:     General: Abdomen is flat. There is no distension.     Palpations: Abdomen is soft.     Tenderness: There is no abdominal tenderness. There is no guarding or rebound.     Comments: Abdomen soft, nondistended, nontender to palpation in all quadrants without guarding or peritoneal signs. No rebound.   Musculoskeletal:        General: Normal range of motion.     Cervical back: Neck supple.     Comments: No thoracic or lumbar midline tenderness.  No reproducible tenderness in lumbar paraspinal region.  5/5 strength of bilateral lower extremities.  Sensation intact bilaterally.  Skin:    General: Skin is warm and dry.  Neurological:     General: No focal deficit present.     Mental Status: He is alert.  Psychiatric:        Mood and Affect: Mood normal.        Behavior: Behavior normal.     (all labs ordered are listed, but only abnormal results are displayed) Labs Reviewed  CBC WITH DIFFERENTIAL/PLATELET - Abnormal; Notable for the following components:      Result Value   WBC 3.6 (*)    Neutro Abs 1.2 (*)    All other components within normal limits  BASIC METABOLIC PANEL WITH GFR    EKG: None  Radiology: No results found.   Procedures   Medications Ordered in the ED  HYDROcodone -acetaminophen  (NORCO/VICODIN) 5-325 MG per tablet 1 tablet (1  tablet Oral Given 12/01/23 1021)  dexamethasone  (DECADRON ) injection 10 mg (10 mg Intravenous Given 12/01/23 1022)  methocarbamol  (ROBAXIN ) tablet 500 mg (500 mg Oral Given 12/01/23 1020)  ketorolac  (TORADOL ) 15 MG/ML injection 15 mg (15 mg Intravenous Given 12/01/23 1124)                                    Medical Decision Making Amount and/or Complexity of Data Reviewed Independent Historian: friend Labs: ordered. Decision-making details documented in ED Course.  Risk Prescription drug management.   This patient presents to the ED for  concern of low back pain, this involves an extensive number of treatment options, and is a complaint that carries with it a high risk of complications and morbidity.  The differential diagnosis includes lumbar radiculopathy, cauda equina, infection, electrolyte abnormality, etc  38 year old male presents to the ED due to low back pain that started yesterday while at work.  Typically lifts heavy things at work.  History of intermittent low back pain in the past.  Denies saddle anesthesia, bowel/bladder incontinence, lower extremity numbness/tingling, lower extremity weakness.  No fever or chills.  No IV drug use or history of cancer.  Upon arrival, stable vitals.  Patient well-appearing on exam. No thoracic or lumbar midline tenderness.  No reproducible tenderness in lumbar paraspinal region.  5/5 strength of bilateral lower extremities.  Sensation intact bilaterally. Pain medication and steroids given for possible lumbar radiculopathy. Given history of AKI and similar pain, labs ordered.  Low suspicion for cauda equina or central cord compression.  No infectious symptoms to suggest infectious etiology.  11:40 AM assessed patient at bedside.  Patient notes improvement in low back pain after IV Toradol .  Low suspicion for cauda equina or central cord compression.  No infectious symptoms to suggest infectious etiology.  Labs reassuring.  No evidence of AKI.  CBC  with slight leukopenia.  Will discharge patient with symptomatic treatment at this time.  No direct injury to suggest bony fracture so will hold off on any imaging.  Patient stable for discharge.Strict ED precautions discussed with patient. Patient states understanding and agrees to plan. Patient discharged home in no acute distress and stable vitals   No PCP Hx of HTN    Final diagnoses:  Acute bilateral low back pain with right-sided sciatica    ED Discharge Orders          Ordered    HYDROcodone -acetaminophen  (NORCO/VICODIN) 5-325 MG tablet  Every 6 hours PRN        12/01/23 1142    naproxen  (NAPROSYN ) 500 MG tablet  2 times daily        12/01/23 1142    methocarbamol  (ROBAXIN ) 500 MG tablet  2 times daily        12/01/23 [address] PA-C 12/01/23 1144    Ula Prentice SAUNDERS, MD 12/02/23 [phone]

## 2023-12-01 NOTE — ED Triage Notes (Signed)
 Patient states bilateral lower back pain that began yesterday. States hx of acute renal failure in 2022 and is concerned for same. Also states might have twisted it at work.
